# Patient Record
Sex: Female | Born: 1935 | Race: White | Hispanic: No | Smoking: Former smoker
Health system: Southern US, Community
[De-identification: ages and names within clinical notes are randomized; demographics above are authoritative.]

## PROBLEM LIST (undated history)

## (undated) DIAGNOSIS — W19XXXA Unspecified fall, initial encounter: Secondary | ICD-10-CM

## (undated) DIAGNOSIS — R2681 Unsteadiness on feet: Secondary | ICD-10-CM

## (undated) DIAGNOSIS — I4821 Permanent atrial fibrillation: Secondary | ICD-10-CM

## (undated) DIAGNOSIS — I10 Essential (primary) hypertension: Secondary | ICD-10-CM

## (undated) DIAGNOSIS — J45909 Unspecified asthma, uncomplicated: Secondary | ICD-10-CM

## (undated) DIAGNOSIS — I5030 Unspecified diastolic (congestive) heart failure: Secondary | ICD-10-CM

## (undated) DIAGNOSIS — N183 Chronic kidney disease, stage 3 unspecified: Secondary | ICD-10-CM

## (undated) DIAGNOSIS — F0390 Unspecified dementia without behavioral disturbance: Secondary | ICD-10-CM

## (undated) DIAGNOSIS — J449 Chronic obstructive pulmonary disease, unspecified: Secondary | ICD-10-CM

## (undated) HISTORY — PX: BREAST SURGERY: SHX581

## (undated) HISTORY — PX: APPENDECTOMY: SHX54

## (undated) HISTORY — PX: HERNIA REPAIR: SHX51

---

## 2012-01-14 DIAGNOSIS — Z01810 Encounter for preprocedural cardiovascular examination: Secondary | ICD-10-CM

## 2014-09-05 ENCOUNTER — Emergency Department: Payer: Self-pay | Admitting: Emergency Medicine

## 2020-07-05 ENCOUNTER — Other Ambulatory Visit: Payer: Self-pay

## 2020-07-05 ENCOUNTER — Emergency Department (HOSPITAL_BASED_OUTPATIENT_CLINIC_OR_DEPARTMENT_OTHER): Payer: Medicare Other

## 2020-07-05 ENCOUNTER — Inpatient Hospital Stay (HOSPITAL_BASED_OUTPATIENT_CLINIC_OR_DEPARTMENT_OTHER)
Admission: EM | Admit: 2020-07-05 | Discharge: 2020-07-07 | DRG: 177 | Disposition: A | Discharge status: Home Health | Payer: Medicare Other | Attending: Family Medicine | Admitting: Family Medicine

## 2020-07-05 ENCOUNTER — Encounter (HOSPITAL_BASED_OUTPATIENT_CLINIC_OR_DEPARTMENT_OTHER): Payer: Self-pay | Admitting: Emergency Medicine

## 2020-07-05 DIAGNOSIS — I10 Essential (primary) hypertension: Secondary | ICD-10-CM | POA: Diagnosis present

## 2020-07-05 DIAGNOSIS — B961 Klebsiella pneumoniae [K. pneumoniae] as the cause of diseases classified elsewhere: Secondary | ICD-10-CM | POA: Diagnosis not present

## 2020-07-05 DIAGNOSIS — N390 Urinary tract infection, site not specified: Secondary | ICD-10-CM | POA: Diagnosis not present

## 2020-07-05 DIAGNOSIS — J9601 Acute respiratory failure with hypoxia: Secondary | ICD-10-CM | POA: Diagnosis present

## 2020-07-05 DIAGNOSIS — U071 COVID-19: Principal | ICD-10-CM | POA: Diagnosis present

## 2020-07-05 DIAGNOSIS — J449 Chronic obstructive pulmonary disease, unspecified: Secondary | ICD-10-CM | POA: Diagnosis not present

## 2020-07-05 DIAGNOSIS — R0902 Hypoxemia: Secondary | ICD-10-CM | POA: Diagnosis present

## 2020-07-05 DIAGNOSIS — F0390 Unspecified dementia without behavioral disturbance: Secondary | ICD-10-CM | POA: Diagnosis present

## 2020-07-05 DIAGNOSIS — J069 Acute upper respiratory infection, unspecified: Secondary | ICD-10-CM | POA: Diagnosis present

## 2020-07-05 DIAGNOSIS — R531 Weakness: Secondary | ICD-10-CM

## 2020-07-05 DIAGNOSIS — Z87891 Personal history of nicotine dependence: Secondary | ICD-10-CM | POA: Diagnosis not present

## 2020-07-05 DIAGNOSIS — N3000 Acute cystitis without hematuria: Secondary | ICD-10-CM

## 2020-07-05 HISTORY — DX: Unspecified asthma, uncomplicated: J45.909

## 2020-07-05 HISTORY — DX: Essential (primary) hypertension: I10

## 2020-07-05 LAB — BASIC METABOLIC PANEL
Anion gap: 13 (ref 5–15)
BUN: 28 mg/dL — ABNORMAL HIGH (ref 8–23)
CO2: 26 mmol/L (ref 22–32)
Calcium: 8.7 mg/dL — ABNORMAL LOW (ref 8.9–10.3)
Chloride: 95 mmol/L — ABNORMAL LOW (ref 98–111)
Creatinine, Ser: 1.02 mg/dL — ABNORMAL HIGH (ref 0.44–1.00)
GFR, Estimated: 52 mL/min — ABNORMAL LOW (ref >60)
Glucose, Bld: 111 mg/dL — ABNORMAL HIGH (ref 70–99)
Potassium: 4 mmol/L (ref 3.5–5.1)
Sodium: 134 mmol/L — ABNORMAL LOW (ref 135–145)

## 2020-07-05 LAB — COMPREHENSIVE METABOLIC PANEL
ALT: 14 U/L (ref 0–44)
AST: 28 U/L (ref 15–41)
Albumin: 3.8 g/dL (ref 3.5–5.0)
Alkaline Phosphatase: 49 U/L (ref 38–126)
Anion gap: 11 (ref 5–15)
BUN: 26 mg/dL — ABNORMAL HIGH (ref 8–23)
CO2: 26 mmol/L (ref 22–32)
Calcium: 8.3 mg/dL — ABNORMAL LOW (ref 8.9–10.3)
Chloride: 96 mmol/L — ABNORMAL LOW (ref 98–111)
Creatinine, Ser: 0.9 mg/dL (ref 0.44–1.00)
GFR, Estimated: 60 mL/min — ABNORMAL LOW (ref >60)
Glucose, Bld: 100 mg/dL — ABNORMAL HIGH (ref 70–99)
Potassium: 3.5 mmol/L (ref 3.5–5.1)
Sodium: 133 mmol/L — ABNORMAL LOW (ref 135–145)
Total Bilirubin: 0.6 mg/dL (ref 0.3–1.2)
Total Protein: 7.6 g/dL (ref 6.5–8.1)

## 2020-07-05 LAB — APTT: aPTT: 27 seconds (ref 24–36)

## 2020-07-05 LAB — URINALYSIS, MICROSCOPIC (REFLEX)

## 2020-07-05 LAB — CBC WITH DIFFERENTIAL/PLATELET
Abs Immature Granulocytes: 0.05 10*3/uL (ref 0.00–0.07)
Basophils Absolute: 0 10*3/uL (ref 0.0–0.1)
Basophils Relative: 0 %
Eosinophils Absolute: 0 10*3/uL (ref 0.0–0.5)
Eosinophils Relative: 0 %
HCT: 46.8 % — ABNORMAL HIGH (ref 36.0–46.0)
Hemoglobin: 15.7 g/dL — ABNORMAL HIGH (ref 12.0–15.0)
Immature Granulocytes: 1 %
Lymphocytes Relative: 10 %
Lymphs Abs: 1.1 10*3/uL (ref 0.7–4.0)
MCH: 31.3 pg (ref 26.0–34.0)
MCHC: 33.5 g/dL (ref 30.0–36.0)
MCV: 93.4 fL (ref 80.0–100.0)
Monocytes Absolute: 0.8 10*3/uL (ref 0.1–1.0)
Monocytes Relative: 8 %
Neutro Abs: 8.7 10*3/uL — ABNORMAL HIGH (ref 1.7–7.7)
Neutrophils Relative %: 81 %
Platelets: 157 10*3/uL (ref 150–400)
RBC: 5.01 MIL/uL (ref 3.87–5.11)
RDW: 14.6 % (ref 11.5–15.5)
WBC: 10.7 10*3/uL — ABNORMAL HIGH (ref 4.0–10.5)
nRBC: 0 % (ref 0.0–0.2)

## 2020-07-05 LAB — TROPONIN I (HIGH SENSITIVITY): Troponin I (High Sensitivity): 15 ng/L (ref <18)

## 2020-07-05 LAB — URINALYSIS, ROUTINE W REFLEX MICROSCOPIC
Bilirubin Urine: NEGATIVE
Glucose, UA: NEGATIVE mg/dL
Ketones, ur: NEGATIVE mg/dL
Nitrite: NEGATIVE
Protein, ur: >300 mg/dL — AB
Specific Gravity, Urine: 1.03 (ref 1.005–1.030)
pH: 5 (ref 5.0–8.0)

## 2020-07-05 LAB — FIBRINOGEN: Fibrinogen: 552 mg/dL — ABNORMAL HIGH (ref 210–475)

## 2020-07-05 LAB — PROTIME-INR
INR: 1.1 (ref 0.8–1.2)
Prothrombin Time: 14 seconds (ref 11.4–15.2)

## 2020-07-05 LAB — TRIGLYCERIDES: Triglycerides: 59 mg/dL (ref <150)

## 2020-07-05 LAB — FERRITIN: Ferritin: 423 ng/mL — ABNORMAL HIGH (ref 11–307)

## 2020-07-05 LAB — D-DIMER, QUANTITATIVE: D-Dimer, Quant: 1.57 ug/mL-FEU — ABNORMAL HIGH (ref 0.00–0.50)

## 2020-07-05 LAB — PROCALCITONIN: Procalcitonin: <0.1 ng/mL

## 2020-07-05 LAB — C-REACTIVE PROTEIN: CRP: 13.8 mg/dL — ABNORMAL HIGH (ref <1.0)

## 2020-07-05 LAB — LACTIC ACID, PLASMA: Lactic Acid, Venous: 1.9 mmol/L (ref 0.5–1.9)

## 2020-07-05 LAB — SARS CORONAVIRUS 2 BY RT PCR (HOSPITAL ORDER, PERFORMED IN ~~LOC~~ HOSPITAL LAB): SARS Coronavirus 2: POSITIVE — AB

## 2020-07-05 LAB — LACTATE DEHYDROGENASE: LDH: 169 U/L (ref 98–192)

## 2020-07-05 MED ORDER — SODIUM CHLORIDE 0.9 % IV SOLN
100.0000 mg | INTRAVENOUS | Status: AC
  Administered 2020-07-05 (×2): 100 mg via INTRAVENOUS

## 2020-07-05 MED ORDER — SODIUM CHLORIDE 0.9 % IV SOLN
1.0000 g | Freq: Once | INTRAVENOUS | Status: AC
  Administered 2020-07-05: 1 g via INTRAVENOUS
  Filled 2020-07-05: qty 10

## 2020-07-05 MED ORDER — SODIUM CHLORIDE 0.9 % IV SOLN
100.0000 mg | Freq: Every day | INTRAVENOUS | Status: DC
  Administered 2020-07-06 – 2020-07-07 (×2): 100 mg via INTRAVENOUS
  Filled 2020-07-05 (×2): qty 20

## 2020-07-05 MED ORDER — SODIUM CHLORIDE 0.9 % IV BOLUS
500.0000 mL | Freq: Once | INTRAVENOUS | Status: AC
  Administered 2020-07-05: 500 mL via INTRAVENOUS

## 2020-07-05 MED ORDER — DEXAMETHASONE SODIUM PHOSPHATE 10 MG/ML IJ SOLN
6.0000 mg | Freq: Once | INTRAMUSCULAR | Status: AC
  Administered 2020-07-05: 6 mg via INTRAVENOUS
  Filled 2020-07-05: qty 1

## 2020-07-05 NOTE — Progress Notes (Signed)
elink monitoring for sepsis protocol 

## 2020-07-05 NOTE — ED Provider Notes (Addendum)
[facility] EMERGENCY DEPARTMENT Provider Note   CSN: [id number] Arrival date & time: 07/05/20  1148     History Chief Complaint  Patient presents with   Urinary Frequency    Bridget [name] is a 87 y.o. female.  HPI     87yo female with history of COPD, hypertension, mild dementia who resides with her daughter presents with concern for cough, generalized weakness, diarrhea.   3 days of fatigue, generalzied weakness, sleepiness Difficulty waking up, leaving head on table even when bringing food in, sleeping Not eating, was drinking for the past few days but didn't drink today Cough Possible covid exposure No body aches, no sore throat Urinary incontinence Diarrhea, has been incontinent of stool No back pain    Past Medical History:  Diagnosis Date   Asthma    Hypertension     There are no problems to display for this patient.   Past Surgical History:  Procedure Laterality Date   APPENDECTOMY     BREAST SURGERY     HERNIA REPAIR       OB History   No obstetric history on file.     History reviewed. No pertinent family history.  Social History   Tobacco Use   Smoking status: Former Smoker  Substance Use Topics   Alcohol use: Never   Drug use: Never    Home Medications Prior to Admission medications   Not on File    Allergies    Patient has no allergy information on record.  Review of Systems   Review of Systems  Constitutional: Positive for activity change, appetite change and fatigue. Negative for fever.  HENT: Negative for sore throat.   Eyes: Negative for visual disturbance.  Respiratory: Positive for cough. Negative for shortness of breath.   Cardiovascular: Negative for chest pain.  Gastrointestinal: Positive for diarrhea. Negative for abdominal pain, nausea and vomiting.  Genitourinary: Positive for frequency. Negative for difficulty urinating.  Musculoskeletal: Negative for back pain and neck pain.  Skin: Negative for  rash.  Neurological: Negative for syncope and headaches.  Psychiatric/Behavioral: Positive for confusion.    Physical Exam Updated Vital Signs BP (!) 141/94 (BP Location: Right Arm)   Pulse 80   Temp 98.1 F (36.7 C) (Oral)   Resp 20   Ht 5\' 4"  (1.626 m)   Wt 56.7 kg   SpO2 97%   BMI 21.46 kg/m   Physical Exam Vitals and nursing note reviewed.  Constitutional:      General: She is not in acute distress.    Appearance: She is well-developed and well-nourished. She is not diaphoretic.     Comments: Sleepy at times  HENT:     Head: Normocephalic and atraumatic.  Eyes:     Extraocular Movements: EOM normal.     Conjunctiva/sclera: Conjunctivae normal.  Cardiovascular:     Rate and Rhythm: Normal rate and regular rhythm.     Pulses: Intact distal pulses.     Heart sounds: Normal heart sounds. No murmur heard. No friction rub. No gallop.   Pulmonary:     Effort: Pulmonary effort is normal. No respiratory distress.     Breath sounds: Normal breath sounds. No wheezing or rales.  Abdominal:     General: There is no distension.     Palpations: Abdomen is soft.     Tenderness: There is no abdominal tenderness. There is no guarding.  Musculoskeletal:        General: No tenderness or edema.  Cervical back: Normal range of motion.  Skin:    General: Skin is warm and dry.     Findings: No erythema or rash.  Neurological:     Mental Status: She is alert.     Cranial Nerves: No cranial nerve deficit, dysarthria or facial asymmetry.     Sensory: Sensation is intact. No sensory deficit.     Motor: Motor function is intact. No weakness or pronator drift.     Coordination: Coordination is intact. Finger-Nose-Finger Test normal.     Comments: Reports nov 2021     ED Results / Procedures / Treatments   Labs (all labs ordered are listed, but only abnormal results are displayed) Labs Reviewed  SARS CORONAVIRUS 2 BY RT PCR (HOSPITAL ORDER, PERFORMED IN [facility])  - Abnormal; Notable for the following components:      Result Value   SARS Coronavirus 2 POSITIVE (*)    All other components within normal limits  URINALYSIS, ROUTINE W REFLEX MICROSCOPIC - Abnormal; Notable for the following components:   APPearance CLOUDY (*)    Hgb urine dipstick SMALL (*)    Protein, ur >300 (*)    Leukocytes,Ua SMALL (*)    All other components within normal limits  BASIC METABOLIC PANEL - Abnormal; Notable for the following components:   Sodium 134 (*)    Chloride 95 (*)    Glucose, Bld 111 (*)    BUN 28 (*)    Creatinine, Ser 1.02 (*)    Calcium 8.7 (*)    GFR, Estimated 52 (*)    All other components within normal limits  CBC WITH DIFFERENTIAL/PLATELET - Abnormal; Notable for the following components:   WBC 10.7 (*)    Hemoglobin 15.7 (*)    HCT 46.8 (*)    Neutro Abs 8.7 (*)    All other components within normal limits  URINALYSIS, MICROSCOPIC (REFLEX) - Abnormal; Notable for the following components:   Bacteria, UA MANY (*)    All other components within normal limits  CULTURE, BLOOD (ROUTINE X 2)  CULTURE, BLOOD (ROUTINE X 2)  URINE CULTURE  LACTIC ACID, PLASMA  PROTIME-INR  APTT  CBC WITH DIFFERENTIAL/PLATELET  COMPREHENSIVE METABOLIC PANEL  D-DIMER, QUANTITATIVE (NOT AT [facility])  PROCALCITONIN  LACTATE DEHYDROGENASE  FERRITIN  TRIGLYCERIDES  FIBRINOGEN  C-REACTIVE PROTEIN  TROPONIN I (HIGH SENSITIVITY)    EKG EKG Interpretation  Date/Time:  Sunday July 05 2020 13:45:06 EST Ventricular Rate:  92 PR Interval:    QRS Duration: 92 QT Interval:  383 QTC Calculation: 469 R Axis:   -145 Text Interpretation: Artifact, suspect sinus rhythm Right axis deviation Abnormal lateral Q waves Abnormal T, consider ischemia, lateral leads No previous ECGs available Confirmed by Alvira Monday ([id number]) on 07/05/2020 2:13:10 PM   Radiology DG Chest [place] 1 View  Result Date: 07/05/2020 CLINICAL DATA:  87 year old female with possible sepsis.  Fatigue. Decreased appetite. EXAM: PORTABLE CHEST 1 VIEW COMPARISON:  No priors. FINDINGS: No acute consolidative airspace disease. Mild diffuse peribronchial cuffing. No pleural effusions. No evidence of pulmonary edema. No pneumothorax. Mild cardiomegaly. Upper mediastinal contours are within normal limits. Aortic atherosclerosis. Orthopedic fixation hardware in the proximal left humerus incompletely imaged. IMPRESSION: 1. Diffuse peribronchial cuffing which could be seen in the setting of acute or chronic bronchitis. 2. Mild cardiomegaly. 3. Aortic atherosclerosis. Electronically Signed   By: Trudie Reed M.D.   On: 07/05/2020 14:30    Procedures Procedures (including critical care time)  Medications Ordered in  ED Medications  sodium chloride 0.9 % bolus 500 mL (has no administration in time range)  dexamethasone (DECADRON) injection 6 mg (has no administration in time range)  cefTRIAXone (ROCEPHIN) 1 g in sodium chloride 0.9 % 100 mL IVPB (0 g Intravenous Stopped 07/05/20 1550)    ED Course  I have reviewed the triage vital signs and the nursing notes.  Pertinent labs & imaging results that were available during my care of the patient were reviewed by me and considered in my medical decision making (see chart for details).    MDM Rules/Calculators/A&P                          87yo female with history of COPD (not on oxygen, has normal O2 saturation at baseline), hypertension, mild dementia who resides with her daughter presents with concern for cough, generalized weakness, diarrhea. No focal findings on history or exam and have low suspicion for CVA or intracranial abnormality.  Labs show no clinically significant electrolyte abnormalities.  No prior Cr.  UA concerning for UTI.  Given rocephin, blood cx ordered.  Lactate normal, mild leukocytosis without other signs of sepsis.    XR with bronchitis.    COVID testing positive.  She is not vaccinated. Ambulatory saturations down to  70s, continued to be decreased to upper 80s with rest after ambulation.  Ordered COVID labs, decadron, remdesivir.  Will admit for further care.    Coagulation studies ordered to evaluate for signs of sepsis/DIC.    Final Clinical Impression(s) / ED Diagnoses Final diagnoses:  COVID-19  Generalized weakness  Hypoxia  Acute cystitis without hematuria    Rx / DC Orders ED Discharge Orders          Ordered    Ambulatory referral for Covid Treatment        07/05/20 1545             Alvira Monday, MD 07/05/20 1610    Alvira Monday, MD 05/28/21 [phone]

## 2020-07-05 NOTE — ED Notes (Signed)
Got baseline SPO2 and HR, prior to ambulation: O2 96-98% in bed with HR 109. Let pt sit still in bed for a few minutes then got pt up to ambulate with assistance. After 77ft of walking pt SPO2 71% and HR 117. Pt asked to go directly to bed, felt very sob and assisted back to bed. At this time pt placed on 2L, stats were a range of 81-86% with HR 113-117. After watching pt in bed for a few minutes, pt felt less sob.  EDP came into room, to also visualize readings on monitor. EKG done at this time and given to MD.

## 2020-07-05 NOTE — ED Notes (Signed)
In room to check on patient.  IV found dislodged.  Still has a second IV that is C/D/I.  Covered with coban for protection.

## 2020-07-05 NOTE — Progress Notes (Signed)
87 year old female with history of COPD/asthma?, HTN and possible dementia presenting with urinary frequency, cough, generalized weakness and diarrhea for about 3 days.  Unvaccinated against COVID-19.   Vital signs within normal range.  Desaturated to 71% with ambulation but upper 90s at rest on room air.  WBC 10.5 with mild left shift.  Na 134. Cr 1.02 (unknown baseline.  BUN 28.  UA with small LE, > 300 protein and many bacteria.  Cultures obtained.  Troponin normal.  EKG without significant finding.  CXR with peribronchial cuffing suggesting bronchitis.  Started on Decadron and ceftriaxone.  Hospitalist service asked for admission for acute respiratory failure in the setting of COVID-19 infection and possible UTI.  Per EDP, full code.  "Daughter stated that she refused to sign DNR before and wanted everything done"  Accepted patient to [facility].

## 2020-07-05 NOTE — ED Notes (Signed)
Report given to [facility] from [facility].  Leaving with [facility] at this time.

## 2020-07-05 NOTE — ED Triage Notes (Signed)
Pt arrives with daughter, c/o urinary incontinence, decreased appetite,and fatigue. Pt AOx4. Daughter endorses recent covid exposure x 1 week pta, denies any symptoms at this time other than fatigue.

## 2020-07-06 ENCOUNTER — Telehealth: Payer: Self-pay

## 2020-07-06 ENCOUNTER — Encounter (HOSPITAL_COMMUNITY): Payer: Self-pay | Admitting: Student

## 2020-07-06 DIAGNOSIS — I10 Essential (primary) hypertension: Secondary | ICD-10-CM | POA: Diagnosis present

## 2020-07-06 DIAGNOSIS — F0390 Unspecified dementia without behavioral disturbance: Secondary | ICD-10-CM | POA: Diagnosis not present

## 2020-07-06 DIAGNOSIS — J069 Acute upper respiratory infection, unspecified: Secondary | ICD-10-CM

## 2020-07-06 DIAGNOSIS — J9601 Acute respiratory failure with hypoxia: Secondary | ICD-10-CM | POA: Diagnosis not present

## 2020-07-06 DIAGNOSIS — N390 Urinary tract infection, site not specified: Secondary | ICD-10-CM | POA: Diagnosis not present

## 2020-07-06 DIAGNOSIS — U071 COVID-19: Principal | ICD-10-CM

## 2020-07-06 DIAGNOSIS — B961 Klebsiella pneumoniae [K. pneumoniae] as the cause of diseases classified elsewhere: Secondary | ICD-10-CM | POA: Diagnosis not present

## 2020-07-06 DIAGNOSIS — Z87891 Personal history of nicotine dependence: Secondary | ICD-10-CM | POA: Diagnosis not present

## 2020-07-06 DIAGNOSIS — J449 Chronic obstructive pulmonary disease, unspecified: Secondary | ICD-10-CM | POA: Diagnosis not present

## 2020-07-06 DIAGNOSIS — R0902 Hypoxemia: Secondary | ICD-10-CM | POA: Diagnosis present

## 2020-07-06 LAB — CBC WITH DIFFERENTIAL/PLATELET
Abs Immature Granulocytes: 0.01 10*3/uL (ref 0.00–0.07)
Basophils Absolute: 0 10*3/uL (ref 0.0–0.1)
Basophils Relative: 0 %
Eosinophils Absolute: 0 10*3/uL (ref 0.0–0.5)
Eosinophils Relative: 0 %
HCT: 45.2 % (ref 36.0–46.0)
Hemoglobin: 14.7 g/dL (ref 12.0–15.0)
Immature Granulocytes: 0 %
Lymphocytes Relative: 7 %
Lymphs Abs: 0.4 10*3/uL — ABNORMAL LOW (ref 0.7–4.0)
MCH: 31.1 pg (ref 26.0–34.0)
MCHC: 32.5 g/dL (ref 30.0–36.0)
MCV: 95.6 fL (ref 80.0–100.0)
Monocytes Absolute: 0.2 10*3/uL (ref 0.1–1.0)
Monocytes Relative: 4 %
Neutro Abs: 5.1 10*3/uL (ref 1.7–7.7)
Neutrophils Relative %: 89 %
Platelets: 149 10*3/uL — ABNORMAL LOW (ref 150–400)
RBC: 4.73 MIL/uL (ref 3.87–5.11)
RDW: 14.3 % (ref 11.5–15.5)
WBC: 5.8 10*3/uL (ref 4.0–10.5)
nRBC: 0 % (ref 0.0–0.2)

## 2020-07-06 LAB — COMPREHENSIVE METABOLIC PANEL
ALT: 15 U/L (ref 0–44)
AST: 28 U/L (ref 15–41)
Albumin: 3.2 g/dL — ABNORMAL LOW (ref 3.5–5.0)
Alkaline Phosphatase: 45 U/L (ref 38–126)
Anion gap: 11 (ref 5–15)
BUN: 25 mg/dL — ABNORMAL HIGH (ref 8–23)
CO2: 25 mmol/L (ref 22–32)
Calcium: 8.1 mg/dL — ABNORMAL LOW (ref 8.9–10.3)
Chloride: 100 mmol/L (ref 98–111)
Creatinine, Ser: 0.69 mg/dL (ref 0.44–1.00)
GFR, Estimated: >60 mL/min (ref >60)
Glucose, Bld: 111 mg/dL — ABNORMAL HIGH (ref 70–99)
Potassium: 3.7 mmol/L (ref 3.5–5.1)
Sodium: 136 mmol/L (ref 135–145)
Total Bilirubin: 0.6 mg/dL (ref 0.3–1.2)
Total Protein: 6.7 g/dL (ref 6.5–8.1)

## 2020-07-06 LAB — D-DIMER, QUANTITATIVE: D-Dimer, Quant: 1.26 ug/mL-FEU — ABNORMAL HIGH (ref 0.00–0.50)

## 2020-07-06 LAB — C-REACTIVE PROTEIN: CRP: 12.1 mg/dL — ABNORMAL HIGH (ref <1.0)

## 2020-07-06 MED ORDER — ONDANSETRON HCL 4 MG PO TABS: 4.0000 mg | ORAL_TABLET | Freq: Four times a day (QID) | ORAL | Status: DC | PRN

## 2020-07-06 MED ORDER — DEXAMETHASONE 4 MG PO TABS
6.0000 mg | ORAL_TABLET | ORAL | Status: DC
  Administered 2020-07-06 – 2020-07-07 (×2): 6 mg via ORAL
  Filled 2020-07-06 (×2): qty 2

## 2020-07-06 MED ORDER — LISINOPRIL-HYDROCHLOROTHIAZIDE 20-12.5 MG PO TABS: 1.0000 | ORAL_TABLET | Freq: Every day | ORAL | Status: DC

## 2020-07-06 MED ORDER — ENOXAPARIN SODIUM 40 MG/0.4ML ~~LOC~~ SOLN
40.0000 mg | SUBCUTANEOUS | Status: DC
  Administered 2020-07-06 – 2020-07-07 (×2): 40 mg via SUBCUTANEOUS
  Filled 2020-07-06 (×2): qty 0.4

## 2020-07-06 MED ORDER — SODIUM CHLORIDE 0.9 % IV SOLN
1.0000 g | Freq: Every day | INTRAVENOUS | Status: DC
  Administered 2020-07-06: 1 g via INTRAVENOUS
  Filled 2020-07-06: qty 10

## 2020-07-06 MED ORDER — HYDROCERIN EX CREA
TOPICAL_CREAM | Freq: Two times a day (BID) | CUTANEOUS | Status: DC
  Filled 2020-07-06: qty 113

## 2020-07-06 MED ORDER — LISINOPRIL 20 MG PO TABS
20.0000 mg | ORAL_TABLET | Freq: Every day | ORAL | Status: DC
  Administered 2020-07-06 – 2020-07-07 (×2): 20 mg via ORAL
  Filled 2020-07-06 (×2): qty 1

## 2020-07-06 MED ORDER — HYDROCHLOROTHIAZIDE 12.5 MG PO CAPS
12.5000 mg | ORAL_CAPSULE | Freq: Every day | ORAL | Status: DC
Start: 2020-07-06 — End: 2020-07-07
  Administered 2020-07-06 – 2020-07-07 (×2): 12.5 mg via ORAL
  Filled 2020-07-06 (×2): qty 1

## 2020-07-06 MED ORDER — ALBUTEROL SULFATE HFA 108 (90 BASE) MCG/ACT IN AERS
1.0000 | INHALATION_SPRAY | RESPIRATORY_TRACT | Status: DC | PRN
  Administered 2020-07-07: 2 via RESPIRATORY_TRACT
  Filled 2020-07-06: qty 6.7

## 2020-07-06 MED ORDER — ONDANSETRON HCL 4 MG/2ML IJ SOLN: 4.0000 mg | Freq: Four times a day (QID) | INTRAMUSCULAR | Status: DC | PRN

## 2020-07-06 MED ORDER — ACETAMINOPHEN 325 MG PO TABS: 650.0000 mg | ORAL_TABLET | Freq: Four times a day (QID) | ORAL | Status: DC | PRN

## 2020-07-06 NOTE — Progress Notes (Signed)
PROGRESS NOTE    Bridget [name]  XIP:[id number] DOB: 10-08-1925 DOA: 07/05/2020 PCP: Glori Bickers, MD   Brief Narrative: Bridget [name] is a 87 y.o. female with a history of COPD/Asthma, hypertension, dementia. Patient presented secondary to generalized weakness and found to have COVID-19 infection.   Assessment & Plan:   Principal Problem:   Acute respiratory disease due to COVID-19 virus Active Problems:   HTN (hypertension)   Acute respiratory disease secondary COVID-19 infection No overt pneumonia. Patient started on Remdesivir and steroids for hypoxia.  -Wean to room air -Keep O2 >90%  COVID-19 infection No overt pneumonia but does have hypoxia.  UTI Gram negative rods on urine culture -Ceftriaxone  Primary hypertension -Continue lisinopril-hydrochlorothiazide    DVT prophylaxis: Lovenox Code Status:   Code Status: Full Code Family Communication: Daughter on telephone Disposition Plan: Discharge likely in 1-2 days pending wean to room air if able, PT/OT recommendations   Consultants:   None  Procedures:   None  Antimicrobials:  Ceftriaxone    Subjective: No issues. Eager to go home.  Objective: Vitals:   07/06/20 0035 07/06/20 0434 07/06/20 0948 07/06/20 1337  BP: (!) 173/103 140/82 (!) 157/74 (!) 141/83  Pulse: 61 73 89 78  Resp: 18 (!) 21 18 16   Temp: 97.9 F (36.6 C) 98.4 F (36.9 C) (!) 97.5 F (36.4 C) 98.1 F (36.7 C)  TempSrc:    Oral  SpO2: 100% 100% 100% 100%  Weight:      Height:        Intake/Output Summary (Last 24 hours) at 07/06/2020 1554 Last data filed at 07/06/2020 1500 Gross per 24 hour  Intake 1160 ml  Output -  Net 1160 ml   Filed Weights   07/05/20 1210  Weight: 56.7 kg    Examination:  General exam: Appears calm and comfortable Respiratory system: Clear to auscultation. Respiratory effort normal. Cardiovascular system: S1 & S2 heard, RRR. No murmurs, rubs, gallops or clicks. Gastrointestinal  system: Abdomen is nondistended, soft and nontender. No organomegaly or masses felt. Normal bowel sounds heard. Central nervous system: Alert and oriented to person and place. No focal neurological deficits. Musculoskeletal: No edema. No calf tenderness Skin: No cyanosis. No rashes Psychiatry: Judgement and insight appear normal. Mood & affect appropriate.     Data Reviewed: I have personally reviewed following labs and imaging studies  CBC Lab Results  Component Value Date   WBC 5.8 07/06/2020   RBC 4.73 07/06/2020   HGB 14.7 07/06/2020   HCT 45.2 07/06/2020   MCV 95.6 07/06/2020   MCH 31.1 07/06/2020   PLT 149 (L) 07/06/2020   MCHC 32.5 07/06/2020   RDW 14.3 07/06/2020   LYMPHSABS 0.4 (L) 07/06/2020   MONOABS 0.2 07/06/2020   EOSABS 0.0 07/06/2020   BASOSABS 0.0 07/06/2020     Last metabolic panel Lab Results  Component Value Date   NA 136 07/06/2020   K 3.7 07/06/2020   CL 100 07/06/2020   CO2 25 07/06/2020   BUN 25 (H) 07/06/2020   CREATININE 0.69 07/06/2020   GLUCOSE 111 (H) 07/06/2020   GFRNONAA >60 07/06/2020   CALCIUM 8.1 (L) 07/06/2020   PROT 6.7 07/06/2020   ALBUMIN 3.2 (L) 07/06/2020   BILITOT 0.6 07/06/2020   ALKPHOS 45 07/06/2020   AST 28 07/06/2020   ALT 15 07/06/2020   ANIONGAP 11 07/06/2020    CBG (last 3)  No results for input(s): GLUCAP in the last 72 hours.   GFR: Estimated  Creatinine Clearance: 38.7 mL/min (by C-G formula based on SCr of 0.69 mg/dL).  Coagulation Profile: Recent Labs  Lab 07/05/20 1442  INR 1.1    Recent Results (from the past 240 hour(s))  Urine culture     Status: Abnormal (Preliminary result)   Collection Time: 07/05/20 12:32 PM   Specimen: In/Out Cath Urine  Result Value Ref Range Status   Specimen Description   Final    IN/OUT CATH URINE Performed at [facility], [address] [place], [place] [address]    Special Requests   Final    NONE Performed at [facility], [address] [place], [place] [address]    Culture >=100,000 COLONIES/mL GRAM NEGATIVE RODS (A)  Final   Report Status PENDING  Incomplete  Blood Culture (routine x 2)     Status: None (Preliminary result)   Collection Time: 07/05/20  2:32 PM   Specimen: BLOOD  Result Value Ref Range Status   Specimen Description   Final    BLOOD LEFT ANTECUBITAL Performed at [facility], [address], [place], [place] [address]    Special Requests   Final    BOTTLES DRAWN AEROBIC AND ANAEROBIC Blood Culture adequate volume Performed at [facility], [address] [place], [place] [address]    Culture PENDING  Incomplete   Report Status PENDING  Incomplete  SARS Coronavirus 2 by RT PCR (hospital order, performed in [facility] hospital lab) Nasopharyngeal Nasopharyngeal Swab     Status: Abnormal   Collection Time: 07/05/20  2:32 PM   Specimen: Nasopharyngeal Swab  Result Value Ref Range Status   SARS Coronavirus 2 POSITIVE (A) NEGATIVE Final    Comment: RESULT CALLED TO, READ BACK BY AND VERIFIED WITH: Heywood Bene, RN AT 1526 ON [id number] BY BOWLBY, J (NOTE) SARS-CoV-2 target nucleic acids are DETECTED  SARS-CoV-2 RNA is generally detectable in upper respiratory specimens  during the acute phase of infection.  Positive results are indicative  of the presence of the identified virus, but do not rule out bacterial infection or co-infection with other pathogens not detected by the test.  Clinical correlation with patient history and  other diagnostic information is necessary to determine patient infection status.  The expected result is negative.  Fact Sheet for Patients:   BoilerBrush.com.cy   Fact Sheet for Healthcare Providers:   https://pope.com/    This test is not yet approved or cleared by the [place] FDA and  has been authorized for detection and/or diagnosis of SARS-CoV-2 by FDA under an Emergency Use Authorization (EUA).   This EUA will remain in effect (mean ing this test can be used) for the duration of  the COVID-19 declaration under Section 564(b)(1) of the Act, 21 U.S.C. section 360-bbb-3(b)(1), unless the authorization is terminated or revoked sooner.  Performed at [facility], [address] [place], [place] [address]         Radiology Studies: DG Chest [place] 1 View  Result Date: 07/05/2020 CLINICAL DATA:  87 year old female with possible sepsis. Fatigue. Decreased appetite. EXAM: PORTABLE CHEST 1 VIEW COMPARISON:  No priors. FINDINGS: No acute consolidative airspace disease. Mild diffuse peribronchial cuffing. No pleural effusions. No evidence of pulmonary edema. No pneumothorax. Mild cardiomegaly. Upper mediastinal contours are within normal limits. Aortic atherosclerosis. Orthopedic fixation hardware in the proximal left humerus incompletely imaged. IMPRESSION: 1. Diffuse peribronchial cuffing which could be seen in the setting of acute  or chronic bronchitis. 2. Mild cardiomegaly. 3. Aortic atherosclerosis. Electronically Signed   By: Trudie Reed M.D.   On: 07/05/2020 14:30        Scheduled Meds: . dexamethasone  6 mg Oral Q24H  . enoxaparin (LOVENOX) injection  40 mg Subcutaneous Q24H  . hydrocerin   Topical BID  . lisinopril  20 mg Oral Daily   And  . hydrochlorothiazide  12.5 mg Oral Daily   Continuous Infusions: . remdesivir 100 mg in NS 100 mL 100 mg (07/06/20 1039)     LOS: 0 days     Jacquelin Hawking, MD [facility] 07/06/2020, 3:54 PM  If 7PM-7AM, please contact night-coverage [email or web address]

## 2020-07-06 NOTE — Telephone Encounter (Signed)
Reviewing pt. Chart for possible MAB. Pt. Currently hospitalized.

## 2020-07-06 NOTE — Plan of Care (Signed)
  Problem: Education: Goal: Knowledge of risk factors and measures for prevention of condition will improve Outcome: Progressing   Problem: Coping: Goal: Psychosocial and spiritual needs will be supported Outcome: Progressing   Problem: Respiratory: Goal: Will maintain a patent airway Outcome: Progressing Goal: Complications related to the disease process, condition or treatment will be avoided or minimized Outcome: Progressing   Problem: Education: Goal: Knowledge of General Education information will improve Description: Including pain rating scale, medication(s)/side effects and non-pharmacologic comfort measures Outcome: Progressing   Problem: Health Behavior/Discharge Planning: Goal: Ability to manage health-related needs will improve Outcome: Progressing   Problem: Clinical Measurements: Goal: Ability to maintain clinical measurements within normal limits will improve Outcome: Progressing Goal: Will remain free from infection Outcome: Progressing Goal: Respiratory complications will improve Outcome: Progressing Goal: Cardiovascular complication will be avoided Outcome: Progressing   Problem: Activity: Goal: Risk for activity intolerance will decrease Outcome: Progressing   Problem: Nutrition: Goal: Adequate nutrition will be maintained Outcome: Progressing   Problem: Coping: Goal: Level of anxiety will decrease Outcome: Progressing   Problem: Elimination: Goal: Will not experience complications related to bowel motility Outcome: Progressing Goal: Will not experience complications related to urinary retention Outcome: Progressing   Problem: Pain Managment: Goal: General experience of comfort will improve Outcome: Progressing   Problem: Safety: Goal: Ability to remain free from injury will improve Outcome: Progressing

## 2020-07-06 NOTE — H&P (Addendum)
History and Physical    Bridget [name] ERD:[id number] DOB: 20-Mar-1926 DOA: 07/05/2020  PCP: Glori Bickers, MD  Patient coming from: Home  I have personally briefly reviewed patient's old medical records in [facility] Link  Chief Complaint: generalized weakness  HPI: Bridget [name] is a 87 y.o. female with medical history significant of COPD/asthma, HTN, mild dementia.  Pt resides at home with daughter.  Pt presents to ED with c/o 3 day h/o cough, generalized weakness, fatigue.  Symptoms onset a few days ago, persistent, worsening.  Associated poor PO intake, diarrhea.  Symptoms constant, now severe.  No known fevers, chills, body aches, sore throat.  Possible COVID exposure x1 week ago.   ED Course: COVID+  Desats to 71% with ambulation but upper 90s at rest on RA.  Started on remdesivir and steroids.  Review of Systems: As per HPI, otherwise all review of systems negative.  Past Medical History:  Diagnosis Date  . Asthma   . Hypertension     Past Surgical History:  Procedure Laterality Date  . APPENDECTOMY    . BREAST SURGERY    . HERNIA REPAIR       reports that she has quit smoking. She has never used smokeless tobacco. She reports that she does not drink alcohol and does not use drugs.  No Known Allergies  Family History  Problem Relation Age of Onset  . Immunocompromised Neg Hx     Prior to Admission medications   Medication Sig Start Date End Date Taking? Authorizing Provider  lisinopril-hydrochlorothiazide (ZESTORETIC) 20-12.5 MG tablet Take 1 tablet by mouth daily. 06/15/20  Yes [provider]  ipratropium-albuterol (DUONEB) 0.5-2.5 (3) MG/3ML SOLN Take 3 mLs by nebulization 4 (four) times daily. 02/24/20   [provider]    Physical Exam: Vitals:   07/05/20 1600 07/05/20 1900 07/05/20 2130 07/06/20 0035  BP: (!) 174/93 (!) 154/103 110/71 (!) 173/103  Pulse: (!) 55 (!) 108 89 61  Resp: (!) 21 (!) 22 17 18   Temp:  98.3 F  (36.8 C) 98.4 F (36.9 C) 97.9 F (36.6 C)  TempSrc:  Oral Oral   SpO2: 93% 99% 99% 100%  Weight:      Height:        Constitutional: NAD, calm, comfortable Eyes: PERRL, lids and conjunctivae normal ENMT: Mucous membranes are moist. Posterior pharynx clear of any exudate or lesions.Normal dentition.  Neck: normal, supple, no masses, no thyromegaly Respiratory: clear to auscultation bilaterally, no wheezing, no crackles. Normal respiratory effort. No accessory muscle use.  Cardiovascular: Regular rate and rhythm, no murmurs / rubs / gallops. No extremity edema. 2+ pedal pulses. No carotid bruits.  Abdomen: no tenderness, no masses palpated. No hepatosplenomegaly. Bowel sounds positive.  Musculoskeletal: no clubbing / cyanosis. No joint deformity upper and lower extremities. Good ROM, no contractures. Normal muscle tone.  Skin: no rashes, lesions, ulcers. No induration Neurologic: CN 2-12 grossly intact. Sensation intact, DTR normal. Strength 5/5 in all 4.  Psychiatric: Normal judgment and insight. Alert and oriented x 3. Normal mood.    Labs on Admission: I have personally reviewed following labs and imaging studies  CBC: Recent Labs  Lab 07/05/20 1232  WBC 10.7*  NEUTROABS 8.7*  HGB 15.7*  HCT 46.8*  MCV 93.4  PLT 157   Basic Metabolic Panel: Recent Labs  Lab 07/05/20 1232 07/05/20 1620  NA 134* 133*  K 4.0 3.5  CL 95* 96*  CO2 26 26  GLUCOSE 111* 100*  BUN  28* 26*  CREATININE 1.02* 0.90  CALCIUM 8.7* 8.3*   GFR: Estimated Creatinine Clearance: 34.4 mL/min (by C-G formula based on SCr of 0.9 mg/dL). Liver Function Tests: Recent Labs  Lab 07/05/20 1620  AST 28  ALT 14  ALKPHOS 49  BILITOT 0.6  PROT 7.6  ALBUMIN 3.8   No results for input(s): LIPASE, AMYLASE in the last 168 hours. No results for input(s): AMMONIA in the last 168 hours. Coagulation Profile: Recent Labs  Lab 07/05/20 1442  INR 1.1   Cardiac Enzymes: No results for input(s): CKTOTAL,  CKMB, CKMBINDEX, TROPONINI in the last 168 hours. BNP (last 3 results) No results for input(s): PROBNP in the last 8760 hours. HbA1C: No results for input(s): HGBA1C in the last 72 hours. CBG: No results for input(s): GLUCAP in the last 168 hours. Lipid Profile: Recent Labs    07/05/20 1620  TRIG 59   Thyroid Function Tests: No results for input(s): TSH, T4TOTAL, FREET4, T3FREE, THYROIDAB in the last 72 hours. Anemia Panel: Recent Labs    07/05/20 1620  FERRITIN 423*   Urine analysis:    Component Value Date/Time   COLORURINE YELLOW 07/05/2020 1232   APPEARANCEUR CLOUDY (A) 07/05/2020 1232   LABSPEC 1.030 07/05/2020 1232   PHURINE 5.0 07/05/2020 1232   GLUCOSEU NEGATIVE 07/05/2020 1232   HGBUR SMALL (A) 07/05/2020 1232   BILIRUBINUR NEGATIVE 07/05/2020 1232   KETONESUR NEGATIVE 07/05/2020 1232   PROTEINUR >300 (A) 07/05/2020 1232   NITRITE NEGATIVE 07/05/2020 1232   LEUKOCYTESUR SMALL (A) 07/05/2020 1232    Radiological Exams on Admission: DG Chest Port 1 View  Result Date: 07/05/2020 CLINICAL DATA:  87 year old female with possible sepsis. Fatigue. Decreased appetite. EXAM: PORTABLE CHEST 1 VIEW COMPARISON:  No priors. FINDINGS: No acute consolidative airspace disease. Mild diffuse peribronchial cuffing. No pleural effusions. No evidence of pulmonary edema. No pneumothorax. Mild cardiomegaly. Upper mediastinal contours are within normal limits. Aortic atherosclerosis. Orthopedic fixation hardware in the proximal left humerus incompletely imaged. IMPRESSION: 1. Diffuse peribronchial cuffing which could be seen in the setting of acute or chronic bronchitis. 2. Mild cardiomegaly. 3. Aortic atherosclerosis. Electronically Signed   By: Trudie Reed M.D.   On: 07/05/2020 14:30    EKG: Independently reviewed.  Assessment/Plan Principal Problem:   Acute respiratory disease due to COVID-19 virus Active Problems:   HTN (hypertension)    1. Acute resp disease due to  COVID-19 1. COVID pathway 2. remdesivir 3. Decadron 4. Currently satting 98% on just 2L -> no baricitinib at this time 5. Daily labs 6. Cont pulse ox 2. Possible UTI - 1. Got single dose of rocephin in ED 2. UCx pending, procalcitonin negative 3. HTN - 1. Cont lisinopril-HCTZ  DVT prophylaxis: Lovenox Code Status: Full code for now Family Communication: No family in room Disposition Plan: Home after O2 requirement improved Consults called: None Admission status: Admit to inpatient  Severity of Illness: The appropriate patient status for this patient is INPATIENT. Inpatient status is judged to be reasonable and necessary in order to provide the required intensity of service to ensure the patient's safety. The patient's presenting symptoms, physical exam findings, and initial radiographic and laboratory data in the context of their chronic comorbidities is felt to place them at high risk for further clinical deterioration. Furthermore, it is not anticipated that the patient will be medically stable for discharge from the hospital within 2 midnights of admission. The following factors support the patient status of inpatient.   IP status due  to new O2 requirement associated with COVID-19.  * I certify that at the point of admission it is my clinical judgment that the patient will require inpatient hospital care spanning beyond 2 midnights from the point of admission due to high intensity of service, high risk for further deterioration and high frequency of surveillance required.*    Nethra Mehlberg M. DO [facility]  How to contact the [facility] Attending or Consulting provider 7A - 7P or covering provider during after hours 7P -7A, for this patient?  1. Check the care team in [facility] and look for a) attending/consulting TRH provider listed and b) the [facility] team listed 2. Log into [email or web address]  Amion Physician Scheduling and messaging for groups and whole hospitals  On call and physician  scheduling software for group practices, residents, hospitalists and other medical providers for call, clinic, rotation and shift schedules. OnCall [employer] is a hospital-wide system for scheduling doctors and paging doctors on call. EasyPlot is for scientific plotting and data analysis.  [email or web address]  and use [place]'s universal password to access. If you do not have the password, please contact the hospital operator.  3. Locate the [facility] provider you are looking for under [facility] and page to a number that you can be directly reached. 4. If you still have difficulty reaching the provider, please page the [facility] (Director on Call) for the Hospitalists listed on amion for assistance.  07/06/2020, 1:30 AM

## 2020-07-07 DIAGNOSIS — U071 COVID-19: Secondary | ICD-10-CM | POA: Diagnosis not present

## 2020-07-07 DIAGNOSIS — R0902 Hypoxemia: Secondary | ICD-10-CM | POA: Diagnosis not present

## 2020-07-07 DIAGNOSIS — J069 Acute upper respiratory infection, unspecified: Secondary | ICD-10-CM | POA: Diagnosis not present

## 2020-07-07 LAB — URINE CULTURE: Culture: >=100000 — AB

## 2020-07-07 LAB — COMPREHENSIVE METABOLIC PANEL
ALT: 19 U/L (ref 0–44)
AST: 28 U/L (ref 15–41)
Albumin: 3.6 g/dL (ref 3.5–5.0)
Alkaline Phosphatase: 48 U/L (ref 38–126)
Anion gap: 10 (ref 5–15)
BUN: 29 mg/dL — ABNORMAL HIGH (ref 8–23)
CO2: 29 mmol/L (ref 22–32)
Calcium: 8.7 mg/dL — ABNORMAL LOW (ref 8.9–10.3)
Chloride: 101 mmol/L (ref 98–111)
Creatinine, Ser: 0.74 mg/dL (ref 0.44–1.00)
GFR, Estimated: >60 mL/min (ref >60)
Glucose, Bld: 133 mg/dL — ABNORMAL HIGH (ref 70–99)
Potassium: 4.3 mmol/L (ref 3.5–5.1)
Sodium: 140 mmol/L (ref 135–145)
Total Bilirubin: 0.4 mg/dL (ref 0.3–1.2)
Total Protein: 7.4 g/dL (ref 6.5–8.1)

## 2020-07-07 LAB — D-DIMER, QUANTITATIVE: D-Dimer, Quant: 0.92 ug/mL-FEU — ABNORMAL HIGH (ref 0.00–0.50)

## 2020-07-07 LAB — CBC WITH DIFFERENTIAL/PLATELET
Abs Immature Granulocytes: 0.03 10*3/uL (ref 0.00–0.07)
Basophils Absolute: 0 10*3/uL (ref 0.0–0.1)
Basophils Relative: 0 %
Eosinophils Absolute: 0 10*3/uL (ref 0.0–0.5)
Eosinophils Relative: 0 %
HCT: 47.7 % — ABNORMAL HIGH (ref 36.0–46.0)
Hemoglobin: 15.2 g/dL — ABNORMAL HIGH (ref 12.0–15.0)
Immature Granulocytes: 1 %
Lymphocytes Relative: 8 %
Lymphs Abs: 0.5 10*3/uL — ABNORMAL LOW (ref 0.7–4.0)
MCH: 30.5 pg (ref 26.0–34.0)
MCHC: 31.9 g/dL (ref 30.0–36.0)
MCV: 95.6 fL (ref 80.0–100.0)
Monocytes Absolute: 0.2 10*3/uL (ref 0.1–1.0)
Monocytes Relative: 3 %
Neutro Abs: 5.9 10*3/uL (ref 1.7–7.7)
Neutrophils Relative %: 88 %
Platelets: 199 10*3/uL (ref 150–400)
RBC: 4.99 MIL/uL (ref 3.87–5.11)
RDW: 14.3 % (ref 11.5–15.5)
WBC: 6.6 10*3/uL (ref 4.0–10.5)
nRBC: 0 % (ref 0.0–0.2)

## 2020-07-07 LAB — C-REACTIVE PROTEIN: CRP: 7.9 mg/dL — ABNORMAL HIGH (ref <1.0)

## 2020-07-07 MED ORDER — DEXAMETHASONE 6 MG PO TABS: 6.0000 mg | ORAL_TABLET | Freq: Every day | ORAL | 0 refills | Status: AC

## 2020-07-07 MED ORDER — CEPHALEXIN 500 MG PO CAPS: 500.0000 mg | ORAL_CAPSULE | Freq: Two times a day (BID) | ORAL | 0 refills | Status: AC

## 2020-07-07 MED ORDER — SODIUM CHLORIDE 0.9 % IV SOLN
1.0000 g | Freq: Once | INTRAVENOUS | Status: AC
  Administered 2020-07-07: 1 g via INTRAVENOUS
  Filled 2020-07-07: qty 1

## 2020-07-07 NOTE — Progress Notes (Signed)
LMOVM for pt daughter to return call in regards to discharge.

## 2020-07-07 NOTE — Progress Notes (Signed)
Occupational Therapy Evaluation  Patient is very pleasant and cooperative with history of cognitive deficits. States she stayed with Bridget [name] and Bridget [name] for a while but states she is now home alone at times "they live 10 mins away." However, per chart patient still living with Bridget [name]. Patient supervision to min G for UB ADL, min A for LB ADL and functional transfers due to x1 loss of balance backing up to toilet with rolling walker. Also requires cues for sequencing ADL tasks. Patient VSS on room air throughout session. Recommend continued acute OT services to maximize patient safety and activity tolerance in order to facilitate D/C to venue listed below.    07/07/20 1300  OT Visit Information  Last OT Received On 07/07/20  Assistance Needed +1  History of Present Illness Bridget [name] is a 87 y.o. female with a history of COPD/Asthma, hypertension, dementia. Patient presented secondary to generalized weakness and found to have COVID-19 infection  Precautions  Precautions Fall  Restrictions  Weight Bearing Restrictions No  Home Living  Family/patient expects to be discharged to: Private residence  Living Arrangements Children  Available Help at Discharge Family  Type of Home House  Additional Comments patient unable to provide home specifics.  Prior Function  Level of Independence Needs assistance  Gait / Transfers Assistance Needed indicates she ambulats at baseline, ? with RW, patient unable to provide reliable history  Communication  Communication No difficulties  Pain Assessment  Pain Assessment Faces  Faces Pain Scale 0  Cognition  Arousal/Alertness Awake/alert  Behavior During Therapy WFL for tasks assessed/performed  Overall Cognitive Status History of cognitive impairments - at baseline  General Comments very sweet, oriented to self, follows directions with increased time and redirection  Upper Extremity Assessment  Upper Extremity Assessment Overall WFL for tasks assessed   Lower Extremity Assessment  Lower Extremity Assessment Defer to PT evaluation  ADL  Overall ADL's  Needs assistance/impaired  Grooming Brushing hair;Minimal assistance;Standing  Grooming Details (indicate cue type and reason) min G for safety  Upper Body Bathing Supervision/ safety;Sitting  Lower Body Bathing Min guard;Sitting/lateral leans;Sit to/from stand  Upper Body Dressing  Supervision/safety;Sitting  Lower Body Dressing Min guard;Sit to/from stand;Sitting/lateral leans  Toilet Transfer Minimal assistance;Ambulation;RW;Grab Forensic psychologist Details (indicate cue type and reason) x1 minor loss of balance posteriorly when backing up to toilet, min A for safety  Toileting- Architect and Hygiene Min guard;Sit to/from stand;Sitting/lateral lean  Functional mobility during ADLs Min guard;Minimal assistance;Rolling walker;Cueing for safety;Cueing for sequencing  General ADL Comments patient appears close to her baseline, would recommend 24/7 Supervision due to cognitive deficits and mild loss of balance during functional transfer in order to minimize risk of falls  Bed Mobility  General bed mobility comments sitting on bed edge  Transfers  Overall transfer level Needs assistance  Equipment used Rolling walker (2 wheeled)  Transfers Sit to/from Stand  Sit to Stand Min assist  Balance  Overall balance assessment Mild deficits observed, not formally tested  General Comments  General comments (skin integrity, edema, etc.) VSS on room air  OT - End of Session  Equipment Utilized During Treatment Rolling walker  Activity Tolerance Patient tolerated treatment well  Patient left in chair;with call bell/phone within reach;with chair alarm set  Nurse Communication Mobility status  OT Assessment  OT Recommendation/Assessment Patient needs continued OT Services  OT Visit Diagnosis Unsteadiness on feet (R26.81)  OT Problem List Impaired balance (sitting and/or  standing);Decreased activity tolerance;Decreased safety  awareness  OT Plan  OT Frequency (ACUTE ONLY) Min 2X/week  OT Treatment/Interventions (ACUTE ONLY) Self-care/ADL training;Therapeutic activities;Patient/family education;Balance training;Cognitive remediation/compensation  AM-PAC OT "6 Clicks" Daily Activity Outcome Measure (Version 2)  Help from another person eating meals? 4  Help from another person taking care of personal grooming? 3  Help from another person toileting, which includes using toliet, bedpan, or urinal? 3  Help from another person bathing (including washing, rinsing, drying)? 3  Help from another person to put on and taking off regular upper body clothing? 3  Help from another person to put on and taking off regular lower body clothing? 3  6 Click Score 19  OT Recommendation  Follow Up Recommendations Supervision/Assistance - 24 hour  OT Equipment None recommended by OT  Individuals Consulted  Consulted and Agree with Results and Recommendations Patient  Acute Rehab OT Goals  Patient Stated Goal agreed to walk in room  OT Goal Formulation With patient  Time For Goal Achievement 07/21/20  Potential to Achieve Goals Good  OT Time Calculation  OT Start Time (ACUTE ONLY) 0857  OT Stop Time (ACUTE ONLY) 4098  OT Time Calculation (min) 25 min  OT General Charges  $OT Visit 1 Visit  OT Evaluation  $OT Eval Low Complexity 1 Low  Written Expression  Dominant Hand  (did not specify)   Bridget [name] OT OT pager: [phone]

## 2020-07-07 NOTE — Progress Notes (Signed)
SATURATION QUALIFICATIONS: (This note is used to comply with regulatory documentation for home oxygen)  Patient Saturations on Room Air at Rest = 95%  Patient Saturations on Room Air while Ambulating = 92%  Patient Saturations on  Liters of oxygen while Ambulating = %NT  Please briefly explain why patient needs home oxygen:Does not require supplemental oxygen. Blanchard Kelch PT Acute Rehabilitation Services Pager [phone] Office [phone]

## 2020-07-07 NOTE — Progress Notes (Signed)
Went over discharge information w/ pt daughter.

## 2020-07-07 NOTE — Evaluation (Signed)
Physical Therapy Evaluation Patient Details Name: Bridget [name] MRN: [id number] DOB: [birth year] Today's Date: 07/07/2020   History of Present Illness  Bridget [name] is a 87 y.o. female with a history of COPD/Asthma, hypertension, dementia. Patient presented secondary to generalized weakness and found to have COVID-19 infection  Clinical Impression  The patient is very pleasantly confused. Ambulated using RW x 50' with min.minguard assistance. Patient  Resides with daughter. Patient should progress to return home with 24/7 supervision. Pt admitted with above diagnosis.   Pt currently with functional limitations due to the deficits listed below (see PT Problem List). Pt will benefit from skilled PT to increase their independence and safety with mobility to allow discharge to the venue listed below.       Follow Up Recommendations Home health PT;Supervision/Assistance - 24 hour    Equipment Recommendations  Rolling walker with 5" wheels/ Unsure if she has one   Recommendations for Other Services       Precautions / Restrictions Precautions Precautions: Fall      Mobility  Bed Mobility               General bed mobility comments: sitting on bed edge    Transfers Overall transfer level: Needs assistance Equipment used: Rolling walker (2 wheeled) Transfers: Sit to/from Stand Sit to Stand: Min assist         General transfer comment: steady assist  to rise, provided RW to steady  Ambulation/Gait Ambulation/Gait assistance: Min assist;Min guard Gait Distance (Feet): 50 Feet Assistive device: Rolling walker (2 wheeled) Gait Pattern/deviations: Step-through pattern;Trunk flexed;Decreased stride length Gait velocity: decr   General Gait Details: patient ambulated in room using RW.  Stairs            Wheelchair Mobility    Modified Rankin (Stroke Patients Only)       Balance Overall balance assessment: Mild deficits observed, not formally tested                                            Pertinent Vitals/Pain      Home Living Family/patient expects to be discharged to:: Private residence Living Arrangements: Children Available Help at Discharge: Family Type of Home: House           Additional Comments: patient unable to provide home specifics.    Prior Function Level of Independence: Needs assistance   Gait / Transfers Assistance Needed: indicates she ambulats at baseline, ? with RW, patient uinable           Hand Dominance        Extremity/Trunk Assessment   Upper Extremity Assessment Upper Extremity Assessment: Generalized weakness    Lower Extremity Assessment Lower Extremity Assessment: Generalized weakness    Cervical / Trunk Assessment Cervical / Trunk Assessment: Kyphotic  Communication   Communication: No difficulties  Cognition     Overall Cognitive Status: History of cognitive impairments - at baseline                                 General Comments: very sweet, oriented to self, follows directions with increased time and redirection      General Comments      Exercises     Assessment/Plan    PT Assessment Patient needs continued PT services  PT Problem List  Decreased strength;Decreased cognition;Decreased mobility;Decreased activity tolerance;Decreased knowledge of use of DME;Decreased safety awareness       PT Treatment Interventions DME instruction;Therapeutic activities;Gait training;Therapeutic exercise;Patient/family education;Functional mobility training    PT Goals (Current goals can be found in the Care Plan section)  Acute Rehab PT Goals Patient Stated Goal: agreed to walk in room PT Goal Formulation: Patient unable to participate in goal setting Time For Goal Achievement: 07/21/20 Potential to Achieve Goals: Good    Frequency Min 3X/week   Barriers to discharge        Co-evaluation               AM-PAC PT "6 Clicks"  Mobility  Outcome Measure Help needed turning from your back to your side while in a flat bed without using bedrails?: A Little Help needed moving from lying on your back to sitting on the side of a flat bed without using bedrails?: A Little Help needed moving to and from a bed to a chair (including a wheelchair)?: A Little Help needed standing up from a chair using your arms (e.g., wheelchair or bedside chair)?: A Little Help needed to walk in hospital room?: A Little Help needed climbing 3-5 steps with a railing? : A Little 6 Click Score: 18    End of Session Equipment Utilized During Treatment: Gait belt Activity Tolerance: Patient tolerated treatment well Patient left: in chair;with call bell/phone within reach;with chair alarm set Nurse Communication: Mobility status PT Visit Diagnosis: Unsteadiness on feet (R26.81)    Time: 8546-2703 PT Time Calculation (min) (ACUTE ONLY): 26 min   Charges:   PT Evaluation $PT Eval Low Complexity: 1 Low          Blanchard Kelch PT Acute Rehabilitation Services Pager [phone] Office [phone]   Rada Hay 07/07/2020, 10:41 AM

## 2020-07-07 NOTE — TOC Transition Note (Addendum)
Transition of Care [facility]) - CM/SW Discharge Note   Patient Details  Name: Bridget [name] MRN: [id number] Date of Birth: 11-30-1925  Transition of Care [facility]) CM/SW Contact:  Ida Rogue, LCSW Phone Number: 07/07/2020, 12:44 PM   Clinical Narrative:   Daughter of patient called in follow up to PT recommendation of HH PT.  [name] welcomes any services we can help with in home-she is open to [facility] PT and would like an aide to help with hygiene as her mother is refusing to get into the tub or shower at home.  She is also open to DME RW.  Contacted [employer] for the walker and Cindie with [employer] for the [facility] services.  [name] is filling an order today and cannot get her until around 6PM to pick up patient.  I told her to call to the floor and we would bring patient down to her at front entrance. No further needs identified  TOC sign off.  Addendum:  Contacted PCP office to confirm that they would sign off on HH orders.  Request confirmed.     Final next level of care: Home w Home Health Services Barriers to Discharge: No Barriers Identified   Patient Goals and CMS Choice        Discharge Placement                       Discharge Plan and Services                                     Social Determinants of Health (SDOH) Interventions     Readmission Risk Interventions No flowsheet data found.

## 2020-07-07 NOTE — Progress Notes (Signed)
SATURATION QUALIFICATIONS: (This note is used to comply with regulatory documentation for home oxygen)  Patient Saturations on Room Air at Rest = 94%  Patient Saturations on Room Air while Ambulating =89%    

## 2020-07-07 NOTE — Discharge Instructions (Signed)
Bridget [name],  You were in the hospital with a COVID-19 infection. You had mild symptoms. You received Remdesivir and steroids. You were also found to have evidence of a UTI for which you are prescribed antibiotics. You can complete a total 10 day course of decadron as well. I recommend a 10 day quarantine duration since your symptoms were mild. Home health physical therapy and a rolling walker have been ordered for you. If you have any worsening trouble breathing, please call your PCP or return to the ED for reevaluation.  Jacquelin Hawking, MD [facility]

## 2020-07-07 NOTE — Discharge Summary (Signed)
Physician Discharge Summary  Bridget Pattydith G Pascarella ZOX:[id number] DOB: 10/01/1925 DOA: 07/05/2020  PCP: Glori Bickersrivedi, Rajendra, MD  Admit date: 07/05/2020 Discharge date: 07/07/2020  Admitted From: Home Disposition: Home  Recommendations for Outpatient Follow-up:  1. Follow up with PCP in 1 week 2. Please follow up on the following pending results: None  Home Health: PT Equipment/Devices: Rolling walker  Discharge Condition: Stable CODE STATUS: Full code Diet recommendation: heart healthy   Brief/Interim Summary:  Admission HPI written by Almon Herculesaye T Gonfa, MD  HPI: Bridget [name] is a 87 y.o. female with medical history significant of COPD/asthma, HTN, mild dementia.  Pt resides at home with daughter.  Pt presents to ED with c/o 3 day h/o cough, generalized weakness, fatigue.  Symptoms onset a few days ago, persistent, worsening.  Associated poor PO intake, diarrhea.  Symptoms constant, now severe.  No known fevers, chills, body aches, sore throat.  Possible COVID exposure x1 week ago.   Hospital course:  Acute respiratory disease secondary COVID-19 infection No overt pneumonia. Patient started on Remdesivir and steroids for hypoxia. She received three doses of Remdesivir. She was weaned to room air and did not qualify for home oxygen during ambulatory pulse oximetry test. Discharge with decadron burst.  COVID-19 infection No overt pneumonia but does have hypoxia. Decadron prescribed on discharge.  UTI Klebsiella pneumoniae infection. Empirically treated with Ceftriaxone IV x2 days. Discharge with Keflex to complete a three day treatment course.  Primary hypertension Continue lisinopril-hydrochlorothiazide   Discharge Diagnoses:  Principal Problem:   Acute respiratory disease due to COVID-19 virus Active Problems:   HTN (hypertension)   Acute lower UTI    Discharge Instructions  Discharge Instructions    Ambulatory referral for Covid Treatment   Complete by: As  directed    Date of Symptom Onset: 07/02/2020   Has the patient previously (in the past 6 months) or do they currently receive IV therapy for a chronic condition?: No   Does the patient have any other medical conditions: Yes   Please list any other medical conditions: COPD, hypertension   Has the patient received the COVID vaccine?: No   Call MD for:  difficulty breathing, headache or visual disturbances   Complete by: As directed    Increase activity slowly   Complete by: As directed      Allergies as of 07/07/2020   No Known Allergies     Medication List    TAKE these medications   cephALEXin 500 MG capsule Commonly known as: Keflex Take 1 capsule (500 mg total) by mouth 2 (two) times daily for 1 day. Start taking on: July 08, 2020   dexamethasone 6 MG tablet Commonly known as: DECADRON Take 1 tablet (6 mg total) by mouth daily for 8 days. Start taking on: July 08, 2020   ipratropium-albuterol 0.5-2.5 (3) MG/3ML Soln Commonly known as: DUONEB Take 3 mLs by nebulization 4 (four) times daily.   lisinopril-hydrochlorothiazide 20-12.5 MG tablet Commonly known as: ZESTORETIC Take 1 tablet by mouth daily.            Durable Medical Equipment  (From admission, onward)         Start     Ordered   07/07/20 1201  For home use only DME Walker rolling  Once       Question Answer Comment  Walker: With 5 Inch Wheels   Patient needs a walker to treat with the following condition COVID-19 virus infection      07/07/20  1201          Follow-up Information    Glori Bickers, MD Follow up.   Specialty: Family Medicine Contact information: [address] [place] [address] [phone]              No Known Allergies  Consultations:  None   Procedures/Studies: DG Chest Port 1 View  Result Date: 07/05/2020 CLINICAL DATA:  87 year old female with possible sepsis. Fatigue. Decreased appetite. EXAM: PORTABLE CHEST 1 VIEW COMPARISON:  No  priors. FINDINGS: No acute consolidative airspace disease. Mild diffuse peribronchial cuffing. No pleural effusions. No evidence of pulmonary edema. No pneumothorax. Mild cardiomegaly. Upper mediastinal contours are within normal limits. Aortic atherosclerosis. Orthopedic fixation hardware in the proximal left humerus incompletely imaged. IMPRESSION: 1. Diffuse peribronchial cuffing which could be seen in the setting of acute or chronic bronchitis. 2. Mild cardiomegaly. 3. Aortic atherosclerosis. Electronically Signed   By: Trudie Reed M.D.   On: 07/05/2020 14:30     Subjective: No dyspnea or chest pain. Feels great.  Discharge Exam: Vitals:   07/07/20 0440 07/07/20 0859  BP: 128/81 133/77  Pulse: 73 74  Resp: 19   Temp:    SpO2: 99%    Vitals:   07/06/20 2205 07/06/20 2315 07/07/20 0440 07/07/20 0859  BP: (!) 174/86 (!) 160/76 128/81 133/77  Pulse: 80  73 74  Resp: 20  19   Temp: 97.8 F (36.6 C)     TempSrc:      SpO2: 100%  99%   Weight:      Height:        General: Pt is alert, awake, not in acute distress Cardiovascular: RRR, S1/S2 +, no rubs, no gallops Respiratory: CTA bilaterally, no wheezing, no rhonchi Abdominal: Soft, NT, ND, bowel sounds + Extremities: no edema, no cyanosis    The results of significant diagnostics from this hospitalization (including imaging, microbiology, ancillary and laboratory) are listed below for reference.     Microbiology: Recent Results (from the past 240 hour(s))  Urine culture     Status: Abnormal   Collection Time: 07/05/20 12:32 PM   Specimen: In/Out Cath Urine  Result Value Ref Range Status   Specimen Description   Final    IN/OUT CATH URINE Performed at [facility], [address] [place], [place] [address]    Special Requests   Final    NONE Performed at [facility], [address] [place], [place] [address]    Culture >=100,000 COLONIES/mL KLEBSIELLA PNEUMONIAE (A)  Final   Report  Status 07/07/2020 FINAL  Final   Organism ID, Bacteria KLEBSIELLA PNEUMONIAE (A)  Final      Susceptibility   Klebsiella pneumoniae - MIC*    AMPICILLIN RESISTANT Resistant     CEFAZOLIN <=4 SENSITIVE Sensitive     CEFEPIME <=0.12 SENSITIVE Sensitive     CEFTRIAXONE <=0.25 SENSITIVE Sensitive     CIPROFLOXACIN <=0.25 SENSITIVE Sensitive     GENTAMICIN <=1 SENSITIVE Sensitive     IMIPENEM <=0.25 SENSITIVE Sensitive     NITROFURANTOIN 64 INTERMEDIATE Intermediate     TRIMETH/SULFA <=20 SENSITIVE Sensitive     AMPICILLIN/SULBACTAM <=2 SENSITIVE Sensitive     PIP/TAZO <=4 SENSITIVE Sensitive     * >=100,000 COLONIES/mL KLEBSIELLA PNEUMONIAE  Blood Culture (routine x 2)     Status: None (Preliminary result)   Collection Time: 07/05/20  2:32 PM   Specimen: BLOOD  Result Value Ref Range Status  Specimen Description   Final    BLOOD LEFT ANTECUBITAL Performed at [facility], [address], [place], [place] [address]    Special Requests   Final    BOTTLES DRAWN AEROBIC AND ANAEROBIC Blood Culture adequate volume Performed at [facility], [address] [place], [place] [address]    Culture   Final    NO GROWTH < 24 HOURS Performed at [facility], [address] [place], [place] [address]    Report Status PENDING  Incomplete  SARS Coronavirus 2 by RT PCR (hospital order, performed in [facility] hospital lab) Nasopharyngeal Nasopharyngeal Swab     Status: Abnormal   Collection Time: 07/05/20  2:32 PM   Specimen: Nasopharyngeal Swab  Result Value Ref Range Status   SARS Coronavirus 2 POSITIVE (A) NEGATIVE Final    Comment: RESULT CALLED TO, READ BACK BY AND VERIFIED WITH: Heywood Bene, RN AT 1526 ON [id number] BY BOWLBY, J (NOTE) SARS-CoV-2 target nucleic acids are DETECTED  SARS-CoV-2 RNA is generally detectable in upper respiratory specimens  during the acute phase of infection.  Positive results are indicative  of the presence of the identified virus, but  do not rule out bacterial infection or co-infection with other pathogens not detected by the test.  Clinical correlation with patient history and  other diagnostic information is necessary to determine patient infection status.  The expected result is negative.  Fact Sheet for Patients:   BoilerBrush.com.cy   Fact Sheet for Healthcare Providers:   https://pope.com/    This test is not yet approved or cleared by the [place] FDA and  has been authorized for detection and/or diagnosis of SARS-CoV-2 by FDA under an Emergency Use Authorization (EUA).  This EUA will remain in effect (mean ing this test can be used) for the duration of  the COVID-19 declaration under Section 564(b)(1) of the Act, 21 U.S.C. section 360-bbb-3(b)(1), unless the authorization is terminated or revoked sooner.  Performed at [facility], [address] [place], [place] [address]   Blood Culture (routine x 2)     Status: None (Preliminary result)   Collection Time: 07/05/20  2:42 PM   Specimen: BLOOD RIGHT ARM  Result Value Ref Range Status   Specimen Description   Final    BLOOD RIGHT ARM Performed at [facility], [address] [place], [place] [address]    Special Requests   Final    BOTTLES DRAWN AEROBIC AND ANAEROBIC Blood Culture adequate volume Performed at [facility], [address] [place], [place] [address]    Culture   Final    NO GROWTH < 24 HOURS Performed at [facility], [address], [place], [place] [address]    Report Status PENDING  Incomplete     Labs: BNP (last 3 results) No results for input(s): BNP in the last 8760 hours. Basic Metabolic Panel: Recent Labs  Lab 07/05/20 1232 07/05/20 1620 07/06/20 0352 07/07/20 0427  NA 134* 133* 136 140  K 4.0 3.5 3.7 4.3  CL 95* 96* 100 101  CO2 26 26 25 29   GLUCOSE 111* 100* 111* 133*  BUN 28* 26* 25* 29*  CREATININE 1.02* 0.90 0.69  0.74  CALCIUM 8.7* 8.3* 8.1* 8.7*   Liver Function Tests: Recent Labs  Lab 07/05/20 1620 07/06/20 0352 07/07/20 0427  AST 28 28 28   ALT 14 15 19   ALKPHOS 49 45  48  BILITOT 0.6 0.6 0.4  PROT 7.6 6.7 7.4  ALBUMIN 3.8 3.2* 3.6   No results for input(s): LIPASE, AMYLASE in the last 168 hours. No results for input(s): AMMONIA in the last 168 hours. CBC: Recent Labs  Lab 07/05/20 1232 07/06/20 0352 07/07/20 0427  WBC 10.7* 5.8 6.6  NEUTROABS 8.7* 5.1 5.9  HGB 15.7* 14.7 15.2*  HCT 46.8* 45.2 47.7*  MCV 93.4 95.6 95.6  PLT 157 149* 199   Cardiac Enzymes: No results for input(s): CKTOTAL, CKMB, CKMBINDEX, TROPONINI in the last 168 hours. BNP: Invalid input(s): POCBNP CBG: No results for input(s): GLUCAP in the last 168 hours. D-Dimer Recent Labs    07/06/20 0352 07/07/20 0427  DDIMER 1.26* 0.92*   Hgb A1c No results for input(s): HGBA1C in the last 72 hours. Lipid Profile Recent Labs    07/05/20 1620  TRIG 59   Thyroid function studies No results for input(s): TSH, T4TOTAL, T3FREE, THYROIDAB in the last 72 hours.  Invalid input(s): FREET3 Anemia work up Recent Labs    07/05/20 1620  FERRITIN 423*   Urinalysis    Component Value Date/Time   COLORURINE YELLOW 07/05/2020 1232   APPEARANCEUR CLOUDY (A) 07/05/2020 1232   LABSPEC 1.030 07/05/2020 1232   PHURINE 5.0 07/05/2020 1232   GLUCOSEU NEGATIVE 07/05/2020 1232   HGBUR SMALL (A) 07/05/2020 1232   BILIRUBINUR NEGATIVE 07/05/2020 1232   KETONESUR NEGATIVE 07/05/2020 1232   PROTEINUR >300 (A) 07/05/2020 1232   NITRITE NEGATIVE 07/05/2020 1232   LEUKOCYTESUR SMALL (A) 07/05/2020 1232   Sepsis Labs Invalid input(s): PROCALCITONIN,  WBC,  LACTICIDVEN Microbiology Recent Results (from the past 240 hour(s))  Urine culture     Status: Abnormal   Collection Time: 07/05/20 12:32 PM   Specimen: In/Out Cath Urine  Result Value Ref Range Status   Specimen Description   Final    IN/OUT CATH  URINE Performed at [facility] [place] [facility], [address] [place], [place] [address]    Special Requests   Final    NONE Performed at [facility], [address] [place], [place] [address]    Culture >=100,000 COLONIES/mL KLEBSIELLA PNEUMONIAE (A)  Final   Report Status 07/07/2020 FINAL  Final   Organism ID, Bacteria KLEBSIELLA PNEUMONIAE (A)  Final      Susceptibility   Klebsiella pneumoniae - MIC*    AMPICILLIN RESISTANT Resistant     CEFAZOLIN <=4 SENSITIVE Sensitive     CEFEPIME <=0.12 SENSITIVE Sensitive     CEFTRIAXONE <=0.25 SENSITIVE Sensitive     CIPROFLOXACIN <=0.25 SENSITIVE Sensitive     GENTAMICIN <=1 SENSITIVE Sensitive     IMIPENEM <=0.25 SENSITIVE Sensitive     NITROFURANTOIN 64 INTERMEDIATE Intermediate     TRIMETH/SULFA <=20 SENSITIVE Sensitive     AMPICILLIN/SULBACTAM <=2 SENSITIVE Sensitive     PIP/TAZO <=4 SENSITIVE Sensitive     * >=100,000 COLONIES/mL KLEBSIELLA PNEUMONIAE  Blood Culture (routine x 2)     Status: None (Preliminary result)   Collection Time: 07/05/20  2:32 PM   Specimen: BLOOD  Result Value Ref Range Status   Specimen Description   Final    BLOOD LEFT ANTECUBITAL Performed at [facility], [address] [place], [place] [address]    Special Requests   Final    BOTTLES DRAWN AEROBIC AND ANAEROBIC Blood Culture adequate volume Performed at [facility] [place], [address] [place], [place] [address]    Culture  Final    NO GROWTH < 24 HOURS Performed at [facility], [address], [place], [place] [address]    Report Status PENDING  Incomplete  SARS Coronavirus 2 by RT PCR (hospital order, performed in [facility] hospital lab) Nasopharyngeal Nasopharyngeal Swab     Status: Abnormal   Collection Time: 07/05/20  2:32 PM   Specimen: Nasopharyngeal Swab  Result Value Ref Range Status   SARS Coronavirus 2 POSITIVE (A) NEGATIVE Final    Comment: RESULT CALLED TO, READ BACK BY AND VERIFIED  WITH: Heywood Bene, RN AT 1526 ON [id number] BY BOWLBY, J (NOTE) SARS-CoV-2 target nucleic acids are DETECTED  SARS-CoV-2 RNA is generally detectable in upper respiratory specimens  during the acute phase of infection.  Positive results are indicative  of the presence of the identified virus, but do not rule out bacterial infection or co-infection with other pathogens not detected by the test.  Clinical correlation with patient history and  other diagnostic information is necessary to determine patient infection status.  The expected result is negative.  Fact Sheet for Patients:   BoilerBrush.com.cy   Fact Sheet for Healthcare Providers:   https://pope.com/    This test is not yet approved or cleared by the [place] FDA and  has been authorized for detection and/or diagnosis of SARS-CoV-2 by FDA under an Emergency Use Authorization (EUA).  This EUA will remain in effect (mean ing this test can be used) for the duration of  the COVID-19 declaration under Section 564(b)(1) of the Act, 21 U.S.C. section 360-bbb-3(b)(1), unless the authorization is terminated or revoked sooner.  Performed at [facility], [address] [place], [place] [address]   Blood Culture (routine x 2)     Status: None (Preliminary result)   Collection Time: 07/05/20  2:42 PM   Specimen: BLOOD RIGHT ARM  Result Value Ref Range Status   Specimen Description   Final    BLOOD RIGHT ARM Performed at [facility], [address] [place], [place] [address]    Special Requests   Final    BOTTLES DRAWN AEROBIC AND ANAEROBIC Blood Culture adequate volume Performed at [facility], [address] [place], [place] [address]    Culture   Final    NO GROWTH < 24 HOURS Performed at [facility], [address], [place], [place] [address]    Report Status PENDING  Incomplete     Time coordinating discharge: 35  minutes  SIGNED:   Jacquelin Hawking, MD [facility] 07/07/2020, 12:05 PM

## 2020-07-10 LAB — CULTURE, BLOOD (ROUTINE X 2)
Culture: NO GROWTH
Culture: NO GROWTH
Special Requests: ADEQUATE
Special Requests: ADEQUATE

## 2020-08-31 ENCOUNTER — Observation Stay
Admit: 2020-08-31 | Discharge: 2020-08-31 | Disposition: A | Discharge status: Home / Self Care | Payer: Medicare Other | Attending: Internal Medicine | Admitting: Internal Medicine

## 2020-08-31 ENCOUNTER — Emergency Department: Payer: Medicare Other

## 2020-08-31 ENCOUNTER — Other Ambulatory Visit: Payer: Self-pay

## 2020-08-31 ENCOUNTER — Observation Stay
Admission: EM | Admit: 2020-08-31 | Discharge: 2020-09-04 | Disposition: A | Discharge status: Home / Self Care | Payer: Medicare Other | Attending: Internal Medicine | Admitting: Internal Medicine

## 2020-08-31 DIAGNOSIS — J45909 Unspecified asthma, uncomplicated: Secondary | ICD-10-CM | POA: Diagnosis not present

## 2020-08-31 DIAGNOSIS — N1832 Chronic kidney disease, stage 3b: Secondary | ICD-10-CM | POA: Diagnosis not present

## 2020-08-31 DIAGNOSIS — Z79899 Other long term (current) drug therapy: Secondary | ICD-10-CM | POA: Insufficient documentation

## 2020-08-31 DIAGNOSIS — Z87891 Personal history of nicotine dependence: Secondary | ICD-10-CM | POA: Diagnosis not present

## 2020-08-31 DIAGNOSIS — N1831 Chronic kidney disease, stage 3a: Secondary | ICD-10-CM | POA: Diagnosis not present

## 2020-08-31 DIAGNOSIS — I130 Hypertensive heart and chronic kidney disease with heart failure and stage 1 through stage 4 chronic kidney disease, or unspecified chronic kidney disease: Secondary | ICD-10-CM | POA: Diagnosis not present

## 2020-08-31 DIAGNOSIS — I5033 Acute on chronic diastolic (congestive) heart failure: Secondary | ICD-10-CM | POA: Insufficient documentation

## 2020-08-31 DIAGNOSIS — I509 Heart failure, unspecified: Secondary | ICD-10-CM | POA: Diagnosis not present

## 2020-08-31 DIAGNOSIS — Z20822 Contact with and (suspected) exposure to covid-19: Secondary | ICD-10-CM | POA: Insufficient documentation

## 2020-08-31 DIAGNOSIS — I10 Essential (primary) hypertension: Secondary | ICD-10-CM | POA: Diagnosis present

## 2020-08-31 DIAGNOSIS — J441 Chronic obstructive pulmonary disease with (acute) exacerbation: Principal | ICD-10-CM | POA: Diagnosis present

## 2020-08-31 DIAGNOSIS — R2681 Unsteadiness on feet: Secondary | ICD-10-CM | POA: Insufficient documentation

## 2020-08-31 DIAGNOSIS — J9601 Acute respiratory failure with hypoxia: Secondary | ICD-10-CM | POA: Insufficient documentation

## 2020-08-31 DIAGNOSIS — I482 Chronic atrial fibrillation, unspecified: Secondary | ICD-10-CM | POA: Diagnosis present

## 2020-08-31 DIAGNOSIS — N183 Chronic kidney disease, stage 3 unspecified: Secondary | ICD-10-CM | POA: Diagnosis present

## 2020-08-31 DIAGNOSIS — Z7951 Long term (current) use of inhaled steroids: Secondary | ICD-10-CM | POA: Insufficient documentation

## 2020-08-31 DIAGNOSIS — R0602 Shortness of breath: Secondary | ICD-10-CM | POA: Diagnosis present

## 2020-08-31 HISTORY — DX: Chronic obstructive pulmonary disease, unspecified: J44.9

## 2020-08-31 LAB — CBC WITH DIFFERENTIAL/PLATELET
Abs Immature Granulocytes: 0.03 10*3/uL (ref 0.00–0.07)
Basophils Absolute: 0.1 10*3/uL (ref 0.0–0.1)
Basophils Relative: 1 %
Eosinophils Absolute: 0.5 10*3/uL (ref 0.0–0.5)
Eosinophils Relative: 6 %
HCT: 48.6 % — ABNORMAL HIGH (ref 36.0–46.0)
Hemoglobin: 15.9 g/dL — ABNORMAL HIGH (ref 12.0–15.0)
Immature Granulocytes: 0 %
Lymphocytes Relative: 14 %
Lymphs Abs: 1.2 10*3/uL (ref 0.7–4.0)
MCH: 31.1 pg (ref 26.0–34.0)
MCHC: 32.7 g/dL (ref 30.0–36.0)
MCV: 94.9 fL (ref 80.0–100.0)
Monocytes Absolute: 0.7 10*3/uL (ref 0.1–1.0)
Monocytes Relative: 7 %
Neutro Abs: 6.3 10*3/uL (ref 1.7–7.7)
Neutrophils Relative %: 72 %
Platelets: 182 10*3/uL (ref 150–400)
RBC: 5.12 MIL/uL — ABNORMAL HIGH (ref 3.87–5.11)
RDW: 14.1 % (ref 11.5–15.5)
WBC: 8.9 10*3/uL (ref 4.0–10.5)
nRBC: 0 % (ref 0.0–0.2)

## 2020-08-31 LAB — BASIC METABOLIC PANEL
Anion gap: 8 (ref 5–15)
BUN: 24 mg/dL — ABNORMAL HIGH (ref 8–23)
CO2: 27 mmol/L (ref 22–32)
Calcium: 9.5 mg/dL (ref 8.9–10.3)
Chloride: 98 mmol/L (ref 98–111)
Creatinine, Ser: 0.91 mg/dL (ref 0.44–1.00)
GFR, Estimated: 59 mL/min — ABNORMAL LOW (ref >60)
Glucose, Bld: 144 mg/dL — ABNORMAL HIGH (ref 70–99)
Potassium: 4.2 mmol/L (ref 3.5–5.1)
Sodium: 133 mmol/L — ABNORMAL LOW (ref 135–145)

## 2020-08-31 LAB — TROPONIN I (HIGH SENSITIVITY)
Troponin I (High Sensitivity): 19 ng/L — ABNORMAL HIGH (ref <18)
Troponin I (High Sensitivity): 22 ng/L — ABNORMAL HIGH (ref <18)

## 2020-08-31 LAB — BRAIN NATRIURETIC PEPTIDE: B Natriuretic Peptide: 691 pg/mL — ABNORMAL HIGH (ref 0.0–100.0)

## 2020-08-31 LAB — SARS CORONAVIRUS 2 (TAT 6-24 HRS): SARS Coronavirus 2: NEGATIVE

## 2020-08-31 MED ORDER — ADULT MULTIVITAMIN W/MINERALS CH
1.0000 | ORAL_TABLET | Freq: Every day | ORAL | Status: DC
  Administered 2020-09-01 – 2020-09-04 (×4): 1 via ORAL
  Filled 2020-08-31 (×4): qty 1

## 2020-08-31 MED ORDER — METHYLPREDNISOLONE SODIUM SUCC 125 MG IJ SOLR
125.0000 mg | Freq: Once | INTRAMUSCULAR | Status: AC
  Administered 2020-08-31: 125 mg via INTRAVENOUS
  Filled 2020-08-31: qty 2

## 2020-08-31 MED ORDER — IPRATROPIUM-ALBUTEROL 0.5-2.5 (3) MG/3ML IN SOLN: 3.0000 mL | Freq: Four times a day (QID) | RESPIRATORY_TRACT | Status: DC

## 2020-08-31 MED ORDER — SODIUM CHLORIDE 0.9 % IV SOLN: 250.0000 mL | INTRAVENOUS | Status: DC | PRN

## 2020-08-31 MED ORDER — SODIUM CHLORIDE 0.9% FLUSH
3.0000 mL | Freq: Two times a day (BID) | INTRAVENOUS | Status: DC
  Administered 2020-08-31 – 2020-09-04 (×9): 3 mL via INTRAVENOUS

## 2020-08-31 MED ORDER — FUROSEMIDE 10 MG/ML IJ SOLN
40.0000 mg | Freq: Every day | INTRAMUSCULAR | Status: DC
  Administered 2020-08-31 – 2020-09-04 (×5): 40 mg via INTRAVENOUS
  Filled 2020-08-31 (×5): qty 4

## 2020-08-31 MED ORDER — AEROCHAMBER PLUS FLO-VU MEDIUM MISC
1.0000 | Freq: Once | Status: DC
  Filled 2020-08-31: qty 1

## 2020-08-31 MED ORDER — FUROSEMIDE 10 MG/ML IJ SOLN: 40.0000 mg | Freq: Once | INTRAMUSCULAR | Status: DC

## 2020-08-31 MED ORDER — GUAIFENESIN-DM 100-10 MG/5ML PO SYRP
5.0000 mL | ORAL_SOLUTION | ORAL | Status: DC | PRN
  Administered 2020-09-01 – 2020-09-03 (×2): 5 mL via ORAL
  Filled 2020-08-31 (×2): qty 5

## 2020-08-31 MED ORDER — LISINOPRIL-HYDROCHLOROTHIAZIDE 20-12.5 MG PO TABS: 1.0000 | ORAL_TABLET | Freq: Every day | ORAL | Status: DC

## 2020-08-31 MED ORDER — ENOXAPARIN SODIUM 40 MG/0.4ML ~~LOC~~ SOLN
40.0000 mg | SUBCUTANEOUS | Status: DC
  Administered 2020-08-31 – 2020-09-04 (×5): 40 mg via SUBCUTANEOUS
  Filled 2020-08-31 (×5): qty 0.4

## 2020-08-31 MED ORDER — METHYLPREDNISOLONE SODIUM SUCC 40 MG IJ SOLR
40.0000 mg | Freq: Two times a day (BID) | INTRAMUSCULAR | Status: AC
  Administered 2020-08-31 – 2020-09-01 (×2): 40 mg via INTRAVENOUS
  Filled 2020-08-31 (×2): qty 1

## 2020-08-31 MED ORDER — SODIUM CHLORIDE 0.9% FLUSH
3.0000 mL | INTRAVENOUS | Status: DC | PRN
  Administered 2020-09-01: 21:00:00 3 mL via INTRAVENOUS

## 2020-08-31 MED ORDER — IPRATROPIUM-ALBUTEROL 0.5-2.5 (3) MG/3ML IN SOLN: 3.0000 mL | Freq: Once | RESPIRATORY_TRACT | Status: DC

## 2020-08-31 MED ORDER — ALBUTEROL SULFATE (2.5 MG/3ML) 0.083% IN NEBU: 2.5000 mg | INHALATION_SOLUTION | RESPIRATORY_TRACT | 2 refills | Status: DC | PRN

## 2020-08-31 MED ORDER — ALBUTEROL SULFATE (2.5 MG/3ML) 0.083% IN NEBU
5.0000 mg | INHALATION_SOLUTION | Freq: Once | RESPIRATORY_TRACT | Status: AC
  Administered 2020-08-31: 5 mg via RESPIRATORY_TRACT
  Filled 2020-08-31: qty 6

## 2020-08-31 MED ORDER — IPRATROPIUM BROMIDE 0.02 % IN SOLN
0.5000 mg | Freq: Once | RESPIRATORY_TRACT | Status: AC
  Administered 2020-08-31: 0.5 mg via RESPIRATORY_TRACT
  Filled 2020-08-31: qty 2.5

## 2020-08-31 MED ORDER — LISINOPRIL 20 MG PO TABS
20.0000 mg | ORAL_TABLET | Freq: Every day | ORAL | Status: DC
  Administered 2020-08-31 – 2020-09-04 (×5): 20 mg via ORAL
  Filled 2020-08-31 (×5): qty 1

## 2020-08-31 MED ORDER — PREDNISONE 20 MG PO TABS
40.0000 mg | ORAL_TABLET | Freq: Every day | ORAL | Status: AC
  Administered 2020-09-01 – 2020-09-04 (×4): 40 mg via ORAL
  Filled 2020-08-31 (×4): qty 2

## 2020-08-31 MED ORDER — ENSURE ENLIVE PO LIQD
237.0000 mL | Freq: Two times a day (BID) | ORAL | Status: DC
  Administered 2020-08-31 – 2020-09-04 (×8): 237 mL via ORAL

## 2020-08-31 MED ORDER — ALBUTEROL SULFATE (2.5 MG/3ML) 0.083% IN NEBU
2.5000 mg | INHALATION_SOLUTION | RESPIRATORY_TRACT | Status: DC | PRN
  Administered 2020-09-01 – 2020-09-02 (×2): 2.5 mg via RESPIRATORY_TRACT
  Filled 2020-08-31 (×2): qty 3

## 2020-08-31 NOTE — ED Provider Notes (Addendum)
[facility] Emergency Department Provider Note  ____________________________________________   Event Date/Time   First MD Initiated Contact with Patient 08/31/20 0600     (approximate)  I have reviewed the triage vital signs and the nursing notes.   HISTORY  Chief Complaint Shortness of Breath    HPI Bridget [name] is a 87 y.o. female with history of hypertension, asthma, COPD not on home oxygen who presents to the emergency department with complaints of intermittent shortness of breath for the past couple of days.  Symptoms worse with exertion.  Patient noted to be speaking in truncated sentences in triage.  She has been wheezing.  No fevers, cough, chest discomfort.  She does have lower extremity swelling bilaterally which is symmetric and unchanged per her daughter.  Reports they tried an albuterol inhaler at home without much relief.  They have ipratropium for the nebulizer machine which has not been helping.  They do not have any albuterol for the nebulizer.    Patient had Covid in end of January 2022.    Past Medical History:  Diagnosis Date  . Asthma   . COPD (chronic obstructive pulmonary disease) (HCC)   . Hypertension     Patient Active Problem List   Diagnosis Date Noted  . HTN (hypertension) 07/06/2020  . Acute lower UTI 07/06/2020  . Acute respiratory disease due to COVID-19 virus 07/05/2020    Past Surgical History:  Procedure Laterality Date  . APPENDECTOMY    . BREAST SURGERY    . HERNIA REPAIR      Prior to Admission medications   Medication Sig Start Date End Date Taking? Authorizing Provider  albuterol (PROVENTIL) (2.5 MG/3ML) 0.083% nebulizer solution Take 3 mLs (2.5 mg total) by nebulization every 4 (four) hours as needed for wheezing or shortness of breath. 08/31/20 08/31/21 Yes Cormac Wint N, DO  ipratropium-albuterol (DUONEB) 0.5-2.5 (3) MG/3ML SOLN Take 3 mLs by nebulization 4 (four) times daily. 02/24/20   [provider]  lisinopril-hydrochlorothiazide (ZESTORETIC) 20-12.5 MG tablet Take 1 tablet by mouth daily. 06/15/20   [provider]    Allergies Patient has no known allergies.  Family History  Problem Relation Age of Onset  . Immunocompromised Neg Hx     Social History Social History   Tobacco Use  . Smoking status: Former Games developer  . Smokeless tobacco: Never Used  Substance Use Topics  . Alcohol use: Never  . Drug use: Never    Review of Systems Constitutional: No fever. Eyes: No visual changes. ENT: No sore throat. Cardiovascular: Denies chest pain. Respiratory: + shortness of breath. Gastrointestinal: No nausea, vomiting, diarrhea. Genitourinary: Negative for dysuria. Musculoskeletal: Negative for back pain. Skin: Negative for rash. Neurological: Negative for focal weakness or numbness.  ____________________________________________   PHYSICAL EXAM:  VITAL SIGNS: ED Triage Vitals [08/31/20 0533]  Enc Vitals Group     BP (!) 172/109     Pulse Rate (!) 102     Resp (!) 24     Temp 98.1 F (36.7 C)     Temp Source Oral     SpO2 93 %     Weight 125 lb (56.7 kg)     Height 5\' 4"  (1.626 m)     Head Circumference      Peak Flow      Pain Score 0     Pain Loc      Pain Edu?      Excl. in GC?    CONSTITUTIONAL:  Alert and oriented and responds appropriately to questions.  Elderly, afebrile HEAD: Normocephalic EYES: Conjunctivae clear, pupils appear equal, EOM appear intact ENT: normal nose; moist mucous membranes NECK: Supple, normal ROM CARD: RRR; S1 and S2 appreciated; no murmurs, no clicks, no rubs, no gallops RESP: Patient is tachypneic with any minimal exertion.  She is diminished at her bases bilaterally and has inspiratory and expiratory wheezes scattered diffusely.  No rhonchi or rales appreciated.  No hypoxia on room air at rest.  No significant increased work of breathing. ABD/GI: Normal bowel sounds; non-distended; soft, non-tender, no  rebound, no guarding, no peritoneal signs, no hepatosplenomegaly BACK: The back appears normal EXT: Normal ROM in all joints; no deformity noted, symmetric edema in bilateral lower extremities that is nonpitting, no calf tenderness or unilateral calf swelling SKIN: Normal color for age and race; warm; no rash on exposed skin NEURO: Moves all extremities equally PSYCH: The patient's mood and manner are appropriate.  ____________________________________________   LABS (all labs ordered are listed, but only abnormal results are displayed)  Labs Reviewed  BASIC METABOLIC PANEL - Abnormal; Notable for the following components:      Result Value   Sodium 133 (*)    Glucose, Bld 144 (*)    BUN 24 (*)    GFR, Estimated 59 (*)    All other components within normal limits  CBC WITH DIFFERENTIAL/PLATELET - Abnormal; Notable for the following components:   RBC 5.12 (*)    Hemoglobin 15.9 (*)    HCT 48.6 (*)    All other components within normal limits  BRAIN NATRIURETIC PEPTIDE - Abnormal; Notable for the following components:   B Natriuretic Peptide 691.0 (*)    All other components within normal limits  TROPONIN I (HIGH SENSITIVITY) - Abnormal; Notable for the following components:   Troponin I (High Sensitivity) 19 (*)    All other components within normal limits  TROPONIN I (HIGH SENSITIVITY)   ____________________________________________  EKG   EKG Interpretation  Date/Time:  Monday August 31 2020 05:47:52 EDT Ventricular Rate:  82 PR Interval:    QRS Duration: 74 QT Interval:  358 QTC Calculation: 418 R Axis:   166 Text Interpretation: Atrial fibrillation Possible Right ventricular hypertrophy Lateral infarct , age undetermined Abnormal ECG No significant change since last tracing Confirmed by Rochele Raring [phone]) on 08/31/2020 6:51:57 AM       ____________________________________________  RADIOLOGY Normajean Baxter Kaytelyn Glore, personally viewed and evaluated these images (plain  radiographs) as part of my medical decision making, as well as reviewing the written report by the radiologist.  ED MD interpretation: Chest x-ray shows no infiltrate, edema.  Official radiology report(s): DG Chest 2 View  Result Date: 08/31/2020 CLINICAL DATA:  87 year old female with shortness of breath since 20/1 100 hours. EXAM: CHEST - 2 VIEW COMPARISON:  Portable chest 07/05/2020. FINDINGS: Seated AP and lateral views of the chest. Improved left lung base ventilation since January, resolved indistinct opacity there. Increased AP dimension to the chest with exaggerated thoracic kyphosis. Mediastinal contours are stable and within normal limits. Calcified aortic atherosclerosis. No pneumothorax, pulmonary edema, pleural effusion or acute pulmonary opacity. Previous left humerus ORIF. Osteopenia. No acute osseous abnormality identified. Negative visible bowel gas. IMPRESSION: 1. Pulmonary hyperinflation.  No acute cardiopulmonary abnormality. 2.  Aortic Atherosclerosis (ICD10-I70.0). Electronically Signed   By: Odessa Fleming M.D.   On: 08/31/2020 06:33    ____________________________________________   PROCEDURES  Procedure(s) performed (including Critical Care):  Procedures  ____________________________________________  INITIAL IMPRESSION / ASSESSMENT AND PLAN / ED COURSE  As part of my medical decision making, I reviewed the following data within the electronic MEDICAL RECORD NUMBER History obtained from family, Nursing notes reviewed and incorporated, Labs reviewed , EKG interpreted , Old EKG reviewed, Old chart reviewed, Patient signed out to Dr. Roxan Hockey, Radiograph reviewed  and Notes from prior ED visits         Patient here with COPD exacerbation.  Differential also includes CHF, pneumonia, pneumothorax, ACS, PE.  Will give albuterol, Atrovent, Solu-Medrol.  Will obtain labs, chest x-ray.  EKG is nonischemic.  She does have some peripheral edema on exam but daughter reports this is  chronic, unchanged.  No known history of CHF.  She denies having any chest pain at this time.  ED PROGRESS  Patient continues to wheeze after 1 breathing treatment.  Will give second and reassess.  Chest x-ray shows no pneumothorax, edema or infiltrate.  Labs unremarkable other than minimally elevated troponin of 19.  She is not having any active chest pain.  Second pending.  BNP elevated at 691.  No known history of CHF.  No old for comparison.  Signed out to Dr. Roxan Hockey.  Patient will need to be reassessed after breathing treatments to determine disposition.  Patient and family updated with plan.   I reviewed all nursing notes and pertinent previous records as available.  I have reviewed and interpreted any EKGs, lab and urine results, imaging (as available).  ____________________________________________   FINAL CLINICAL IMPRESSION(S) / ED DIAGNOSES  Final diagnoses:  COPD exacerbation (HCC)     ED Discharge Orders         Ordered    albuterol (PROVENTIL) (2.5 MG/3ML) 0.083% nebulizer solution  Every 4 hours PRN        08/31/20 0708          *Please note:  SABINE TENENBAUM was evaluated in Emergency Department on 08/31/2020 for the symptoms described in the history of present illness. She was evaluated in the context of the global COVID-19 pandemic, which necessitated consideration that the patient might be at risk for infection with the SARS-CoV-2 virus that causes COVID-19. Institutional protocols and algorithms that pertain to the evaluation of patients at risk for COVID-19 are in a state of rapid change based on information released by regulatory bodies including the CDC and federal and state organizations. These policies and algorithms were followed during the patient's care in the ED.  Some ED evaluations and interventions may be delayed as a result of limited staffing during and the pandemic.*   Note:  This document was prepared using Dragon voice recognition software and may  include unintentional dictation errors.   Bryttani Blew, Layla Maw, DO 08/31/20 0721    Bobby Barton, Layla Maw, DO 08/31/20 [phone]

## 2020-08-31 NOTE — H&P (Signed)
History and Physical    Bridget [name] ERD:[id number] DOB: [birth year] DOA: 08/31/2020  PCP: Glori Bickers, MD   Patient coming from: Home  I have personally briefly reviewed patient's old medical records in [name]  Chief Complaint: Shortness of breath  HPI: Bridget [name] is a 87 y.o. female with medical history significant for COPD, hypertension and mild dementia who presents to the emergency room for evaluation of worsening shortness of breath from her baseline.  Patient states that she normally has trouble breathing but over the last couple of days it has been worse.  Her daughter and caregiver who was at the bedside states that she had to place her mother on supplemental oxygen 2 days ago when her pulse oximetry dropped to 87 -88% at rest.  With ambulation she states that her pulse oximetry was about 87%.  Patient is not on continuous home oxygen but appears to have a conserving device at home.  Shortness of breath is associated with 3 pillow orthopnea and mild lower extremity swelling.  She has a cough that is nonproductive.  Her daughter is concerned that she has puffiness over her eyelids which she states is unusual for the patient. She denies having any fever, no chills, no nausea, no vomiting, no abdominal pain, no dizziness, no lightheadedness, no headache, no blurred vision, no nocturia, no dysuria, no diarrhea, no constipation, no frequency, no palpitations, no diaphoresis. Labs show sodium 133, potassium 4.2, chloride 98, bicarb 27, glucose 144, BUN 24, creatinine 0.91, calcium 9.5, BNP 691, troponin 19 >> 22, white count 8.9, hemoglobin 15.9, hematocrit 48.6, MCV 94.9, RDW 14.1, platelet count 182 Chest x-ray reviewed by me shows hyperinflated lung fields Twelve-lead EKG reviewed by me shows atrial fibrillation with RVH.   ED Course: Patient is a 87 year old Caucasian female who presents to the emergency room for evaluation of worsening shortness of breath from her  baseline for about 3 days associated with 3 pillow orthopnea, lower extremity swelling and diffuse wheezing.  She has a history of COPD and is on home oxygen as needed.  Patient received bronchodilators in the ER as well as systemic steroids and will be referred to observation status for further evaluation.       Review of Systems: As per HPI otherwise all other systems reviewed and negative.    Past Medical History:  Diagnosis Date  . Asthma   . COPD (chronic obstructive pulmonary disease) (HCC)   . Hypertension     Past Surgical History:  Procedure Laterality Date  . APPENDECTOMY    . BREAST SURGERY    . HERNIA REPAIR       reports that she has quit smoking. She has never used smokeless tobacco. She reports that she does not drink alcohol and does not use drugs.  No Known Allergies  Family History  Problem Relation Age of Onset  . Immunocompromised Neg Hx       Prior to Admission medications   Medication Sig Start Date End Date Taking? Authorizing Provider  albuterol (PROVENTIL) (2.5 MG/3ML) 0.083% nebulizer solution Take 3 mLs (2.5 mg total) by nebulization every 4 (four) hours as needed for wheezing or shortness of breath. 08/31/20 08/31/21 Yes Ward, Kristen N, DO  ipratropium-albuterol (DUONEB) 0.5-2.5 (3) MG/3ML SOLN Take 3 mLs by nebulization 4 (four) times daily. 02/24/20   [provider]  lisinopril-hydrochlorothiazide (ZESTORETIC) 20-12.5 MG tablet Take 1 tablet by mouth daily. 06/15/20   [provider]    Physical Exam: Vitals:  08/31/20 0700 08/31/20 0730 08/31/20 0800 08/31/20 0900  BP: 131/87 (!) 144/89 (!) 144/73 (!) 149/83  Pulse: 62 76 75 76  Resp: 15 19 16  (!) 21  Temp:      TempSrc:      SpO2: 96% 100% 99% 94%  Weight:      Height:         Vitals:   08/31/20 0700 08/31/20 0730 08/31/20 0800 08/31/20 0900  BP: 131/87 (!) 144/89 (!) 144/73 (!) 149/83  Pulse: 62 76 75 76  Resp: 15 19 16  (!) 21  Temp:      TempSrc:       SpO2: 96% 100% 99% 94%  Weight:      Height:          Constitutional: Alert and oriented x 2 .  Has conversational dyspnea HEENT:      Head: Normocephalic and atraumatic.         Eyes: PERLA, EOMI, Conjunctivae are normal. Sclera is non-icteric.       Mouth/Throat: Mucous membranes are moist.       Neck: Supple with no signs of meningismus. Cardiovascula.r: Irregularly irregular. No murmurs, gallops, or rubs. 2+ symmetrical distal pulses are present . No JVD. 1+ LE edema Respiratory: Respiratory effort normal .diffuse expiratory wheezes.   Gastrointestinal: Soft, non tender, and non distended with positive bowel sounds.  Genitourinary: No CVA tenderness. Musculoskeletal: Nontender with normal range of motion in all extremities. No cyanosis, or erythema of extremities. Neurologic:  Face is symmetric. Moving all extremities. No gross focal neurologic deficits  Skin: Skin is warm, dry.  No rash or ulcers, xerosis of lower extremities Psychiatric: Mood and affect are normal    Labs on Admission: I have personally reviewed following labs and imaging studies  CBC: Recent Labs  Lab 08/31/20 0535  WBC 8.9  NEUTROABS 6.3  HGB 15.9*  HCT 48.6*  MCV 94.9  PLT 182   Basic Metabolic Panel: Recent Labs  Lab 08/31/20 0535  NA 133*  K 4.2  CL 98  CO2 27  GLUCOSE 144*  BUN 24*  CREATININE 0.91  CALCIUM 9.5   GFR: Estimated Creatinine Clearance: 34.1 mL/min (by C-G formula based on SCr of 0.91 mg/dL). Liver Function Tests: No results for input(s): AST, ALT, ALKPHOS, BILITOT, PROT, ALBUMIN in the last 168 hours. No results for input(s): LIPASE, AMYLASE in the last 168 hours. No results for input(s): AMMONIA in the last 168 hours. Coagulation Profile: No results for input(s): INR, PROTIME in the last 168 hours. Cardiac Enzymes: No results for input(s): CKTOTAL, CKMB, CKMBINDEX, TROPONINI in the last 168 hours. BNP (last 3 results) No results for input(s): PROBNP in the last  8760 hours. HbA1C: No results for input(s): HGBA1C in the last 72 hours. CBG: No results for input(s): GLUCAP in the last 168 hours. Lipid Profile: No results for input(s): CHOL, HDL, LDLCALC, TRIG, CHOLHDL, LDLDIRECT in the last 72 hours. Thyroid Function Tests: No results for input(s): TSH, T4TOTAL, FREET4, T3FREE, THYROIDAB in the last 72 hours. Anemia Panel: No results for input(s): VITAMINB12, FOLATE, FERRITIN, TIBC, IRON, RETICCTPCT in the last 72 hours. Urine analysis:    Component Value Date/Time   COLORURINE YELLOW 07/05/2020 1232   APPEARANCEUR CLOUDY (A) 07/05/2020 1232   LABSPEC 1.030 07/05/2020 1232   PHURINE 5.0 07/05/2020 1232   GLUCOSEU NEGATIVE 07/05/2020 1232   HGBUR SMALL (A) 07/05/2020 1232   BILIRUBINUR NEGATIVE 07/05/2020 1232   KETONESUR NEGATIVE 07/05/2020 1232   PROTEINUR >300 (  A) 07/05/2020 1232   NITRITE NEGATIVE 07/05/2020 1232   LEUKOCYTESUR SMALL (A) 07/05/2020 1232    Radiological Exams on Admission: DG Chest 2 View  Result Date: 08/31/2020 CLINICAL DATA:  87 year old female with shortness of breath since 20/1 100 hours. EXAM: CHEST - 2 VIEW COMPARISON:  Portable chest 07/05/2020. FINDINGS: Seated AP and lateral views of the chest. Improved left lung base ventilation since January, resolved indistinct opacity there. Increased AP dimension to the chest with exaggerated thoracic kyphosis. Mediastinal contours are stable and within normal limits. Calcified aortic atherosclerosis. No pneumothorax, pulmonary edema, pleural effusion or acute pulmonary opacity. Previous left humerus ORIF. Osteopenia. No acute osseous abnormality identified. Negative visible bowel gas. IMPRESSION: 1. Pulmonary hyperinflation.  No acute cardiopulmonary abnormality. 2.  Aortic Atherosclerosis (ICD10-I70.0). Electronically Signed   By: Odessa Fleming M.D.   On: 08/31/2020 06:33     Assessment/Plan Principal Problem:   COPD with acute exacerbation (HCC) Active Problems:   HTN  (hypertension)   Atrial fibrillation, chronic (HCC)   Acute CHF (congestive heart failure) (HCC)   CKD (chronic kidney disease), stage III (HCC)    COPD with acute exacerbation Patient has a history of COPD and presents to the ER for evaluation of worsening shortness of breath from her baseline associated with diffuse wheezing and a nonproductive cough. We will place patient on scheduled and as needed bronchodilator therapy Place patient on systemic steroids She will need to be assessed for home oxygen need prior to discharge    Chronic atrial fibrillation Patient is rate controlled Patient has a CHA2DS2-VASc score of 4 and ideally requires long-term anticoagulation as primary prophylaxis for an acute stroke She is not on anticoagulation due to high risk for falls    Hypertension with stage III chronic kidney disease Continue lisinopril We will hold hydrochlorothiazide for now    Probable acute diastolic dysfunction CHF Patient with complaints of worsening shortness of breath associated with orthopnea and pedal edema. We will start patient on Lasix 40 mg IV daily Hold hydrochlorothiazide Obtain 2D echocardiogram to assess LVEF   DVT prophylaxis: Lovenox Code Status: DNR resuscitate Family Communication: Greater than 50% of time was spent discussing patient's condition and plan of care with her daughter Dolores Hoose at the bedside.  All questions and concerns have been addressed.  They verbalized understanding and agree with the plan.  CODE STATUS was discussed the patient is a DNR Disposition Plan: Back to previous home environment Consults called: none Status: Observation    Cyruss Arata MD [facility]     08/31/2020, 9:46 AM

## 2020-08-31 NOTE — ED Notes (Signed)
Pt presenting to ED with reports of SOB starting around 4am this morning. Pt's daughter, who patient lives with, reports that patient also had an episode of SOB a few days ago. Pt denies CP, fever, cough, n/v.

## 2020-08-31 NOTE — Evaluation (Signed)
Occupational Therapy Evaluation Patient Details Name: Bridget [name] MRN: [id number] DOB: May 05, 1926 Today's Date: 08/31/2020    History of Present Illness 87 y.o. female with medical history significant for COPD, hypertension and mild dementia who presents to the emergency room for evaluation of worsening shortness of breath from her baseline.  Patient states that she normally has trouble breathing but over the last couple of days it has been worse.  Her daughter and caregiver who was at the bedside states that she had to place her mother on supplemental oxygen 2 days ago when her pulse oximetry dropped to 87 -88% at rest.  With ambulation she states that her pulse oximetry was about 87%.  Patient is not on continuous home oxygen but appears to have a conserving device at home.   Clinical Impression    Patient presenting with decreased I in self care, balance, functional mobility/transfer, endurance, and safety awareness.  Patient with decreased recall of new information this session. No family present to confirm baseline. Pt reports her husband dying ~ 1 week ago and living with daughter since. Pt reports use of SPC as needed for functional mobility/transfers  PTA. Pt required min hand held assistance for functional mobility, toilet transfer, and balance for safety. Patient will benefit from acute OT to increase overall independence in the areas of ADLs, functional mobility, and safety awareness in order to safely discharge home with family.   Follow Up Recommendations  Home health OT;Supervision/Assistance - 24 hour    Equipment Recommendations  None recommended by OT       Precautions / Restrictions Precautions Precautions: Fall      Mobility Bed Mobility Overal bed mobility: Needs Assistance Bed Mobility: Supine to Sit;Sit to Supine     Supine to sit: Min guard Sit to supine: Min guard   General bed mobility comments: min cuing for technique    Transfers Overall transfer  level: Needs assistance Equipment used: None Transfers: Sit to/from [employer] Sit to Stand: Min assist Stand pivot transfers: Min assist       General transfer comment: min cuing for safety awareness and technique    Balance Overall balance assessment: Needs assistance Sitting-balance support: Feet supported Sitting balance-Leahy Scale: Good     Standing balance support: During functional activity Standing balance-Leahy Scale: Fair                             ADL either performed or assessed with clinical judgement   ADL Overall ADL's : Needs assistance/impaired     Grooming: Wash/dry hands;Wash/dry face;Sitting;Set up;Supervision/safety           Upper Body Dressing : Minimal assistance Upper Body Dressing Details (indicate cue type and reason): hospital gown Lower Body Dressing: Minimal assistance Lower Body Dressing Details (indicate cue type and reason): doffing Toilet Transfer: Minimal assistance;Ambulation Toilet Transfer Details (indicate cue type and reason): HHA for balance Toileting- Clothing Manipulation and Hygiene: Minimal assistance       Functional mobility during ADLs: Minimal assistance;Cueing for safety;Cueing for sequencing       Vision Baseline Vision/History: No visual deficits Patient Visual Report: No change from baseline              Pertinent Vitals/Pain Pain Assessment: No/denies pain     Hand Dominance Right   Extremity/Trunk Assessment Upper Extremity Assessment Upper Extremity Assessment: Generalized weakness   Lower Extremity Assessment Lower Extremity Assessment: Generalized weakness  Communication Communication Communication: No difficulties   Cognition Arousal/Alertness: Awake/alert Behavior During Therapy: WFL for tasks assessed/performed Overall Cognitive Status: No family/caregiver present to determine baseline cognitive functioning                                  General Comments: Pt very pleasant but unable to recall new information. Pt reports it is "May 2012". OT discussed date and pt reports feeling confused. OT asked pt again 5 minutes later and she again reports "May 2012".              Home Living Family/patient expects to be discharged to:: Private residence Living Arrangements: Children Available Help at Discharge: Family;Available 24 hours/day Type of Home: House Home Access: Stairs to enter [employer] of Steps: 2-3 steps Entrance Stairs-Rails: Right;Left Home Layout: One level     Bathroom Shower/Tub: Tub/shower unit         Home Equipment: Environmental consultant - 2 wheels;Cane - single point;Shower seat;Bedside commode   Additional Comments: Pt does appear to have some memory deficits. She reports her husband died ~ 1 week ago and has been living with daughter since. She reports her daughter works from home and is there all the time with her.      Prior Functioning/Environment Level of Independence: Independent with assistive device(s)  Gait / Transfers Assistance Needed: Pt reports she uses SPC at baseline for ambulation. ADL's / Homemaking Assistance Needed: Pt reports performing all self care tasks with use of equipment/modifications as needed            OT Problem List: Decreased strength;Decreased activity tolerance;Impaired balance (sitting and/or standing);Decreased coordination;Pain;Decreased safety awareness;Decreased knowledge of use of DME or AE;Cardiopulmonary status limiting activity;Decreased cognition      OT Treatment/Interventions: Self-care/ADL training;Manual therapy;Therapeutic exercise;Neuromuscular education;Energy conservation;DME and/or AE instruction;Cognitive remediation/compensation;Therapeutic activities;Balance training;Patient/family education    OT Goals(Current goals can be found in the care plan section) Acute Rehab OT Goals Patient Stated Goal: to feel better and get stronger OT Goal  Formulation: With patient Time For Goal Achievement: 09/14/20 Potential to Achieve Goals: Good ADL Goals Pt Will Perform Grooming: with modified independence;standing Pt Will Perform Lower Body Dressing: with modified independence;sit to/from stand Pt Will Transfer to Toilet: with modified independence;ambulating Pt Will Perform Toileting - Clothing Manipulation and hygiene: with modified independence;sit to/from stand  OT Frequency: Min 2X/week              AM-PAC OT "6 Clicks" Daily Activity     Outcome Measure Help from another person eating meals?: None Help from another person taking care of personal grooming?: A Little Help from another person toileting, which includes using toliet, bedpan, or urinal?: A Little Help from another person bathing (including washing, rinsing, drying)?: A Little Help from another person to put on and taking off regular upper body clothing?: None Help from another person to put on and taking off regular lower body clothing?: A Little 6 Click Score: 20   End of Session Equipment Utilized During Treatment: Rolling walker Nurse Communication: Mobility status  Activity Tolerance: Patient tolerated treatment well Patient left: in bed;with call bell/phone within reach;with nursing/sitter in room  OT Visit Diagnosis: Unsteadiness on feet (R26.81);Repeated falls (R29.6);Muscle weakness (generalized) (M62.81);History of falling (Z91.81)                Time: 8341-9622 OT Time Calculation (min): 19 min Charges:  OT General Charges $OT Visit: 1  Visit OT Evaluation $OT Eval Low Complexity: 1 Low OT Treatments $Self Care/Home Management : 8-22 mins  Jackquline Denmark, MS, OTR/L , CBIS ascom [phone]  08/31/20, 2:06 PM

## 2020-08-31 NOTE — Progress Notes (Signed)
*  PRELIMINARY RESULTS* Echocardiogram 2D Echocardiogram has been performed.  Bridget [name] 08/31/2020, 6:50 PM

## 2020-08-31 NOTE — ED Triage Notes (Signed)
Reports sudden onset of SOB last night 9PM, has used her PRN nebs and supplemental oxygen without relief. Pt able to speak in short few word sentences with increased WOB with talking or movement noted at time of triage assessment. In no RR distress at this time.

## 2020-08-31 NOTE — ED Notes (Signed)
Dr. Roxan Hockey in to reassess pt at this time.  Duo neb held at this time, pt placed on RA will monitor.

## 2020-08-31 NOTE — ED Provider Notes (Signed)
Patient reassessed and still having significant wheezing.  She is feeling improved.  Based on her age risk factors do feel that she would benefit from hospitalization for additional resp tx and monitoring.  States she is a DNR.  Conversation witnessed by patient's daughter at bedside.     Willy Eddy, MD 08/31/20 [phone]

## 2020-08-31 NOTE — Plan of Care (Signed)

## 2020-08-31 NOTE — Progress Notes (Signed)
Met patient who was sitting alone in bed. She immediatelt stated after I asking " how are You?" she was lonely. She asked if that was a terrible thing to say. I assured her it was not. She shared abouth her 5 children and 14 grandchildren, of who she is so proud. She asked me of my family. A very pleasant visit, I promised to return to visit her tomorrow.

## 2020-08-31 NOTE — ED Notes (Signed)
Pt to XR at this time

## 2020-08-31 NOTE — Progress Notes (Signed)
Initial Nutrition Assessment  DOCUMENTATION CODES:   Not applicable  INTERVENTION:   Ensure Enlive po BID, each supplement provides 350 kcal and 20 grams of protein  MVI po daily   NUTRITION DIAGNOSIS:   Increased nutrient needs related to catabolic illness (COPD) as evidenced by estimated needs.  GOAL:   Patient will meet greater than or equal to 90% of their needs  MONITOR:   PO intake,Supplement acceptance,Labs,Weight trends,Skin,I & O's  REASON FOR ASSESSMENT:   Consult COPD Protocol  ASSESSMENT:   87 y.o. female with history of hypertension, asthma, recent COVID 19 in January, mild dementia, Afib, CKD III and COPD not on home oxygen who presents to the emergency department with complaints of intermittent shortness of breath.  RD working remotely.  Unable to reach pt by phone. Suspect pt with decreased oral intake pta r/t SOB. There are no documented intakes in chart. RD will add supplements and MVI to help pt meet her estimated needs. There is also no documented weight history in chart to determine if any significant recent weight changes. RD will obtain nutrition related history and exam at follow-up.   Medications reviewed and include: lovenox, lasix, solu-medrol  Labs reviewed: Na 133(L), BUN 24(H)  NUTRITION - FOCUSED PHYSICAL EXAM: Unable to perform at this time   Diet Order:   Diet Order            Diet 2 gram sodium Room service appropriate? Yes; Fluid consistency: Thin  Diet effective now                EDUCATION NEEDS:   No education needs have been identified at this time  Skin:  Skin Assessment: Reviewed RN Assessment  Last BM:  pta  Height:   Ht Readings from Last 1 Encounters:  08/31/20 5\' 4"  (1.626 m)    Weight:   Wt Readings from Last 1 Encounters:  08/31/20 56.7 kg    Ideal Body Weight:  54.5 kg  BMI:  Body mass index is 21.46 kg/m.  Estimated Nutritional Needs:   Kcal:  1400-1600kcal/day  Protein:   70-80g/day  Fluid:  1.4-1.6L/day  09/02/20 MS, RD, LDN Please refer to [facility] for RD and/or RD on-call/weekend/after hours pager

## 2020-08-31 NOTE — Evaluation (Signed)
Physical Therapy Evaluation Patient Details Name: Bridget [name] MRN: [id number] DOB: 1925-11-29 Today's Date: 08/31/2020   History of Present Illness  Pt is a 87 y.o. female presenting to hospital 3/21 with intermittent SOB for past couple of days; worsens with exertion.  Pt also noted with 3 pillow orthopnea, LE swelling, and diffuse wheezing.  Pt admitted with COPD with acute exacerbation and probable acute diastolic dysfunction CHF.  PMH includes htn, asthma, COPD (no home O2).  PMH includes (+) COVID 19 07/05/20, asthma, COPD, htn, acute lower UTI, breast surgery, hernia repair, mild dementia.  Clinical Impression  Prior to hospital admission, pt was modified independent ambulating with SPC; recently living with her daughter (d/t pt's husband passing away about 1 week ago) in a 1 level home with 2-3 STE with railing.  Mild cognitive deficits noted during session.  Currently pt is SBA semi-supine to sitting edge of bed; CGA with transfers (hand hold assist); and CGA ambulating 5 feet (with hand hold assist).  Pt incontinent of urine 1st two times standing but able to transfer to [facility] to finish toileting on 3rd trial standing (therapist assisted pt with clean-up).  Limited distance ambulating d/t SOB (O2 sats 92% or greater on room air and HR 94-107 bpm during sessions activities).  Pt would benefit from skilled PT to address noted impairments and functional limitations (see below for any additional details).  Upon hospital discharge, pt would benefit from HHPT.    Follow Up Recommendations Home health PT    Equipment Recommendations  Other (comment) (pt has RW and SPC at home already)    Recommendations for Other Services OT consult     Precautions / Restrictions Precautions Precautions: Fall Restrictions Weight Bearing Restrictions: No      Mobility  Bed Mobility Overal bed mobility: Needs Assistance Bed Mobility: Supine to Sit     Supine to sit: Supervision;HOB elevated    General bed mobility comments: mild increased effort to perform on own    Transfers Overall transfer level: Needs assistance Equipment used: 1 person hand held assist Transfers: Sit to/from Stand Sit to Stand: Min guard Stand pivot transfers: Min guard (stand step turn bed to [facility])       General transfer comment: fairly strong stand with single UE hand hold assist for support (x3 trials from bed and x1 trial from [facility])  Ambulation/Gait Ambulation/Gait assistance: Min guard Gait Distance (Feet): 5 Feet Assistive device: 1 person hand held assist   Gait velocity: decreased   General Gait Details: decreased B LE step length/foot clearance/heelstrike; limited distance d/t SOB  Stairs            Wheelchair Mobility    Modified Rankin (Stroke Patients Only)       Balance Overall balance assessment: Needs assistance Sitting-balance support: No upper extremity supported;Feet supported Sitting balance-Leahy Scale: Normal Sitting balance - Comments: steady sitting reaching outside BOS   Standing balance support: Single extremity supported Standing balance-Leahy Scale: Fair Standing balance comment: pt requiring at least single UE support for standing activities                             Pertinent Vitals/Pain Pain Assessment: No/denies pain    Home Living Family/patient expects to be discharged to:: Private residence Living Arrangements: Children (Currently staying with pt's daughter (d/t husband passing away about 1 week ago)) Available Help at Discharge: Family;Available 24 hours/day Type of Home: House Home Access: Stairs to  enter Entrance Stairs-Rails: Left Entrance Stairs-Number of Steps: 2-3 steps Home Layout: One level Home Equipment: Walker - 2 wheels;Cane - single point;Shower seat;Bedside commode Additional Comments: Per OT eval, pt reports her daughter works from home and is there all the time.  Mild memory deficits noted.    Prior Function  Level of Independence: Independent with assistive device(s)   Gait / Transfers Assistance Needed: Ambulatory with SPC.  Pt reports no recent falls.  ADL's / Homemaking Assistance Needed: Per OT eval "Pt reports performing all self care tasks with use of equipment/modifications as needed"        Hand Dominance   Dominant Hand: Right    Extremity/Trunk Assessment   Upper Extremity Assessment Upper Extremity Assessment: Defer to OT evaluation;Generalized weakness    Lower Extremity Assessment Lower Extremity Assessment: Generalized weakness    Cervical / Trunk Assessment Cervical / Trunk Assessment: Other exceptions Cervical / Trunk Exceptions: forward head/shoulders  Communication   Communication: No difficulties  Cognition Arousal/Alertness: Awake/alert Behavior During Therapy: WFL for tasks assessed/performed Overall Cognitive Status: No family/caregiver present to determine baseline cognitive functioning                                 General Comments: Oriented to person, place, and general situation.      General Comments   Nursing cleared pt for participation in physical therapy.  Pt agreeable to PT session.    Exercises     Assessment/Plan    PT Assessment Patient needs continued PT services  PT Problem List Decreased strength;Decreased activity tolerance;Decreased balance;Decreased mobility;Decreased knowledge of use of DME;Decreased knowledge of precautions       PT Treatment Interventions DME instruction;Gait training;Stair training;Functional mobility training;Therapeutic activities;Therapeutic exercise;Balance training;Patient/family education    PT Goals (Current goals can be found in the Care Plan section)  Acute Rehab PT Goals Patient Stated Goal: to improve breathing and mobility PT Goal Formulation: With patient Time For Goal Achievement: 09/14/20 Potential to Achieve Goals: Good    Frequency Min 2X/week   Barriers to discharge         Co-evaluation               AM-PAC PT "6 Clicks" Mobility  Outcome Measure Help needed turning from your back to your side while in a flat bed without using bedrails?: None Help needed moving from lying on your back to sitting on the side of a flat bed without using bedrails?: A Little Help needed moving to and from a bed to a chair (including a wheelchair)?: A Little Help needed standing up from a chair using your arms (e.g., wheelchair or bedside chair)?: A Little Help needed to walk in hospital room?: A Little Help needed climbing 3-5 steps with a railing? : A Little 6 Click Score: 19    End of Session Equipment Utilized During Treatment: Gait belt Activity Tolerance: Patient tolerated treatment well Patient left: in chair;with call bell/phone within reach;with chair alarm set;with nursing/sitter in room Nurse Communication: Mobility status;Precautions (pt incontinent of urine during session) PT Visit Diagnosis: Other abnormalities of gait and mobility (R26.89);Unsteadiness on feet (R26.81);Muscle weakness (generalized) (M62.81)    Time: 5809-9833 PT Time Calculation (min) (ACUTE ONLY): 27 min   Charges:   PT Evaluation $PT Eval Low Complexity: 1 Low PT Treatments $Therapeutic Activity: 8-22 mins       Hendricks Limes, PT 08/31/20, 4:37 PM

## 2020-09-01 ENCOUNTER — Encounter: Payer: Self-pay | Admitting: Internal Medicine

## 2020-09-01 DIAGNOSIS — I482 Chronic atrial fibrillation, unspecified: Secondary | ICD-10-CM | POA: Diagnosis not present

## 2020-09-01 DIAGNOSIS — J441 Chronic obstructive pulmonary disease with (acute) exacerbation: Secondary | ICD-10-CM | POA: Diagnosis not present

## 2020-09-01 DIAGNOSIS — I5031 Acute diastolic (congestive) heart failure: Secondary | ICD-10-CM | POA: Diagnosis not present

## 2020-09-01 LAB — CBC
HCT: 48.4 % — ABNORMAL HIGH (ref 36.0–46.0)
Hemoglobin: 15.7 g/dL — ABNORMAL HIGH (ref 12.0–15.0)
MCH: 30.7 pg (ref 26.0–34.0)
MCHC: 32.4 g/dL (ref 30.0–36.0)
MCV: 94.5 fL (ref 80.0–100.0)
Platelets: 190 10*3/uL (ref 150–400)
RBC: 5.12 MIL/uL — ABNORMAL HIGH (ref 3.87–5.11)
RDW: 14.3 % (ref 11.5–15.5)
WBC: 14.5 10*3/uL — ABNORMAL HIGH (ref 4.0–10.5)
nRBC: 0 % (ref 0.0–0.2)

## 2020-09-01 LAB — ECHOCARDIOGRAM COMPLETE
AR max vel: 1.56 cm2
AV Peak grad: 7.7 mmHg
Ao pk vel: 1.39 m/s
Area-P 1/2: 3.74 cm2
Height: 64 in
S' Lateral: 2.79 cm
Weight: 2000 oz

## 2020-09-01 LAB — BASIC METABOLIC PANEL
Anion gap: 11 (ref 5–15)
BUN: 25 mg/dL — ABNORMAL HIGH (ref 8–23)
CO2: 26 mmol/L (ref 22–32)
Calcium: 9.3 mg/dL (ref 8.9–10.3)
Chloride: 101 mmol/L (ref 98–111)
Creatinine, Ser: 0.93 mg/dL (ref 0.44–1.00)
GFR, Estimated: 58 mL/min — ABNORMAL LOW (ref >60)
Glucose, Bld: 117 mg/dL — ABNORMAL HIGH (ref 70–99)
Potassium: 4.5 mmol/L (ref 3.5–5.1)
Sodium: 138 mmol/L (ref 135–145)

## 2020-09-01 LAB — HIV ANTIBODY (ROUTINE TESTING W REFLEX): HIV Screen 4th Generation wRfx: NONREACTIVE

## 2020-09-01 NOTE — Plan of Care (Signed)
Updated pt's daughter, Dolores Hoose.  Wants update from dr relating to ECHO and EKG and if mother has CHF. She expressed concern that her mother not be d/ced too soon b/c her mother will insist on leaving.  Pt very pleasantly confused.

## 2020-09-01 NOTE — TOC Initial Note (Signed)
Transition of Care [facility]) - Initial/Assessment Note    Patient Details  Name: Bridget [name] MRN: [id number] Date of Birth: February 14, 1926  Transition of Care [facility]) CM/SW Contact:    Shelbie Hutching, RN Phone Number: 09/01/2020, 2:35 PM  Clinical Narrative:                 Patient placed under observation for COPD exacerbation.  RNCM met with patient in the room this morning and then spoke with patient's daughter, Lattie Haw, via phone.  Patient lives with Lattie Haw and has been for the past year.  Patient has dementia.  PT is recommending home health and Lattie Haw reports that they would love to have home health services again and they really liked [facility].  RNCM reached out to [facility] to see if they can accept for RN, PT, OT and aide.  Patient has a walker at home.  Daughter provides all transportation.  Patient is current with her PCP but he is in [place] where patient was living previously.  Daughter will be looking for a local PCP in the near future.   Potential for discharge tomorrow.   Expected Discharge Plan: [place] Barriers to Discharge: Continued Medical Work up   Patient Goals and CMS Choice Patient states their goals for this hospitalization and ongoing recovery are:: Daughter would like for patient to return home with home health services. CMS Medicare.gov Compare Post Acute Care list provided to:: Patient Represenative (must comment) Choice offered to / list presented to : Adult Children  Expected Discharge Plan and Services Expected Discharge Plan: [place]   Discharge Planning Services: CM Consult Post Acute Care Choice: Home Health Living arrangements for the past 2 months: Single Family Home                 DME Arranged: N/A DME Agency: NA       HH Arranged: RN,PT,OT,Nurse's Aide HH Agency: [place] Date [place]: 09/01/20 Time HH Agency Contacted: 62 Representative spoke with at [place]: Tommi Rumps  Prior Living  Arrangements/Services Living arrangements for the past 2 months: [place] Lives with:: Adult Children Patient language and need for interpreter reviewed:: Yes Do you feel safe going back to the place where you live?: Yes      Need for Family Participation in Patient Care: Yes (Comment) (Dementia) Care giver support system in place?: Yes (comment) (daughter and son in law) Current home services: DME (walker, cane) Criminal Activity/Legal Involvement Pertinent to Current Situation/Hospitalization: No - Comment as needed  Activities of Daily Living Home Assistive Devices/Equipment: Grab bars in shower,Grab bars around toilet ADL Screening (condition at time of admission) Patient's cognitive ability adequate to safely complete daily activities?: Yes Is the patient deaf or have difficulty hearing?: No Does the patient have difficulty seeing, even when wearing glasses/contacts?: No Does the patient have difficulty concentrating, remembering, or making decisions?: Yes Patient able to express need for assistance with ADLs?: Yes Does the patient have difficulty dressing or bathing?: No Independently performs ADLs?: Yes (appropriate for developmental age) Does the patient have difficulty walking or climbing stairs?: Yes Weakness of Legs: None Weakness of Arms/Hands: None  Permission Sought/Granted Permission sought to share information with : Case Manager,Family Supports,Other (comment) Permission granted to share information with : Yes, Verbal Permission Granted  Share Information with NAME: Lattie Haw  Permission granted to share info w AGENCY: [facility]  Permission granted to share info w Relationship: daughter  Emotional Assessment Appearance:: Appears stated age Attitude/Demeanor/Rapport: Engaged Affect (typically observed): Accepting Orientation: : Oriented to Self,Oriented to Place,Oriented to  Time Alcohol / Substance Use: Not Applicable Psych Involvement: No  (comment)  Admission diagnosis:  COPD exacerbation ([place]) [J44.1] COPD with acute exacerbation ([place]) [J44.1] Patient Active Problem List   Diagnosis Date Noted  . COPD with acute exacerbation ([place]) 08/31/2020  . Atrial fibrillation, chronic ([place]) 08/31/2020  . Acute CHF (congestive heart failure) ([place]) 08/31/2020  . CKD (chronic kidney disease), stage III ([place]) 08/31/2020  . HTN (hypertension) 07/06/2020  . Acute lower UTI 07/06/2020  . Acute respiratory disease due to COVID-19 virus 07/05/2020   PCP:  Keane Police, MD Pharmacy:   [facility] [address], [place] - [place] [address] [place] [address] Phone: [phone] Fax: [phone]  [facility] [place], [place] [facility] [place] [address] [place] [address] Phone: [phone] Fax: [phone]  [id number] [place]: [phone]: [phone]    Social Determinants of Health (SDOH) Interventions    Readmission Risk Interventions No flowsheet data found.

## 2020-09-01 NOTE — Care Management Obs Status (Signed)
MEDICARE OBSERVATION STATUS NOTIFICATION   Patient Details  Name: Bridget [name] MRN: [id number] Date of Birth: 20-Oct-1925   Medicare Observation Status Notification Given:  Yes    Allayne Butcher, RN 09/01/2020, 11:18 AM

## 2020-09-01 NOTE — Progress Notes (Addendum)
Progress Note    Bridget [name]  SPQ:[id number] DOB: 02/18/1926  DOA: 08/31/2020 PCP: Glori Bickers, MD      Brief Narrative:    Medical records reviewed and are as summarized below:  Bridget [name] is a 87 y.o. female with medical history significant for COPD, hypertension, mild dementia, who presented to the hospital because of worsening shortness of breath, nonproductive cough, 3 pillow orthopnea, facial puffiness and bilateral leg swelling.    She was admitted to the hospital for acute hypoxic respiratory failure, acute exacerbation of chronic diastolic CHF and COPD exacerbation.  She was treated with IV Lasix, steroids and bronchodilators.      Assessment/Plan:   Principal Problem:   COPD with acute exacerbation (HCC) Active Problems:   HTN (hypertension)   Atrial fibrillation, chronic (HCC)   Acute CHF (congestive heart failure) (HCC)   CKD (chronic kidney disease), stage III (HCC)   Nutrition Problem: Increased nutrient needs Etiology: catabolic illness (COPD)  Signs/Symptoms: estimated needs   Body mass index is 21.46 kg/m.    Acute exacerbation of chronic diastolic CHF: Continue IV Lasix.  Monitor BMP, daily weight and urine output.  2D echo showed EF estimated at 60 to 65%, grade 1 diastolic dysfunction and mild mitral regurgitation.  COPD exacerbation: Continue prednisone and bronchodilators  Acute hypoxic respiratory failure: Improved and she is tolerating room air.  Assess need for home oxygen prior to discharge.  Unsteady gait: PT and OT evaluation.  Chronic atrial fibrillation: CHA2DS2-VASc score is 4.  She is not on anticoagulation because of high risk for falls.  CKD stage IIIa: Creatinine is stable  Leukocytosis: This is probably due to steroids.  Other comorbidities include hypertension, mild dementia.  I called her daughter, [name], at [phone], but there was no response.  I left a voice message for her to call back.  Diet  Order            Diet 2 gram sodium Room service appropriate? Yes; Fluid consistency: Thin  Diet effective now                      Medications:   . AeroChamber Plus Flo-Vu Medium  1 each Other Once  . enoxaparin (LOVENOX) injection  40 mg Subcutaneous Q24H  . feeding supplement  237 mL Oral BID BM  . furosemide  40 mg Intravenous Daily  . ipratropium-albuterol  3 mL Nebulization Once  . lisinopril  20 mg Oral Daily  . multivitamin with minerals  1 tablet Oral Daily  . predniSONE  40 mg Oral Q breakfast  . sodium chloride flush  3 mL Intravenous Q12H   Continuous Infusions: . sodium chloride       Anti-infectives (From admission, onward)   None             Family Communication/Anticipated D/C date and plan/Code Status   DVT prophylaxis: enoxaparin (LOVENOX) injection 40 mg Start: 08/31/20 1000     Code Status: DNR  Family Communication: None Disposition Plan:    Status is: Observation  The patient will require care spanning > 2 midnights and should be moved to inpatient because: Unsafe d/c plan, IV treatments appropriate due to intensity of illness or inability to take PO and Inpatient level of care appropriate due to severity of illness  Dispo: The patient is from: Home              Anticipated d/c is to: Home  Patient currently is not medically stable to d/c.   Difficult to place patient No           Subjective:   Interval events noted.  C/o shortness of breath.  She was very unsteady on her feet when she ambulated with the nurse to the bathroom.  She was also very short of breath with ambulation.  Objective:    Vitals:   09/01/20 0536 09/01/20 0745 09/01/20 0746 09/01/20 1132  BP: (!) 146/84 122/77 122/71 (!) 113/59  Pulse: 72 63 79 82  Resp: 20  18 18   Temp: 97.8 F (36.6 C) 97.8 F (36.6 C) 97.8 F (36.6 C) 98 F (36.7 C)  TempSrc:      SpO2: 93% 93% 93% 95%  Weight:      Height:       No data  found.   Intake/Output Summary (Last 24 hours) at 09/01/2020 1441 Last data filed at 09/01/2020 1400 Gross per 24 hour  Intake 240 ml  Output 3200 ml  Net -2960 ml   Filed Weights   08/31/20 0533  Weight: 56.7 kg    Exam:  GEN: NAD SKIN: Warm and dry.  Chronic skin changes including erythema on bilateral legs suggestive of chronic venous insufficiency. EYES: EOMI ENT: MMM CV: RRR PULM: Decreased air entry bilaterally, bilateral expiratory wheezing.  No rales heard. ABD: soft, ND, NT, +BS CNS: AAO x 3, non focal EXT: No edema or tenderness     Data Reviewed:   I have personally reviewed following labs and imaging studies:  Labs: Labs show the following:   Basic Metabolic Panel: Recent Labs  Lab 08/31/20 0535 09/01/20 0517  NA 133* 138  K 4.2 4.5  CL 98 101  CO2 27 26  GLUCOSE 144* 117*  BUN 24* 25*  CREATININE 0.91 0.93  CALCIUM 9.5 9.3   GFR Estimated Creatinine Clearance: 33.3 mL/min (by C-G formula based on SCr of 0.93 mg/dL). Liver Function Tests: No results for input(s): AST, ALT, ALKPHOS, BILITOT, PROT, ALBUMIN in the last 168 hours. No results for input(s): LIPASE, AMYLASE in the last 168 hours. No results for input(s): AMMONIA in the last 168 hours. Coagulation profile No results for input(s): INR, PROTIME in the last 168 hours.  CBC: Recent Labs  Lab 08/31/20 0535 09/01/20 0517  WBC 8.9 14.5*  NEUTROABS 6.3  --   HGB 15.9* 15.7*  HCT 48.6* 48.4*  MCV 94.9 94.5  PLT 182 190   Cardiac Enzymes: No results for input(s): CKTOTAL, CKMB, CKMBINDEX, TROPONINI in the last 168 hours. BNP (last 3 results) No results for input(s): PROBNP in the last 8760 hours. CBG: No results for input(s): GLUCAP in the last 168 hours. D-Dimer: No results for input(s): DDIMER in the last 72 hours. Hgb A1c: No results for input(s): HGBA1C in the last 72 hours. Lipid Profile: No results for input(s): CHOL, HDL, LDLCALC, TRIG, CHOLHDL, LDLDIRECT in the last  72 hours. Thyroid function studies: No results for input(s): TSH, T4TOTAL, T3FREE, THYROIDAB in the last 72 hours.  Invalid input(s): FREET3 Anemia work up: No results for input(s): VITAMINB12, FOLATE, FERRITIN, TIBC, IRON, RETICCTPCT in the last 72 hours. Sepsis Labs: Recent Labs  Lab 08/31/20 0535 09/01/20 0517  WBC 8.9 14.5*    Microbiology Recent Results (from the past 240 hour(s))  SARS CORONAVIRUS 2 (TAT 6-24 HRS) Nasopharyngeal Nasopharyngeal Swab     Status: None   Collection Time: 08/31/20 10:34 AM   Specimen: Nasopharyngeal Swab  Result Value  Ref Range Status   SARS Coronavirus 2 NEGATIVE NEGATIVE Final    Comment: (NOTE) SARS-CoV-2 target nucleic acids are NOT DETECTED.  The SARS-CoV-2 RNA is generally detectable in upper and lower respiratory specimens during the acute phase of infection. Negative results do not preclude SARS-CoV-2 infection, do not rule out co-infections with other pathogens, and should not be used as the sole basis for treatment or other patient management decisions. Negative results must be combined with clinical observations, patient history, and epidemiological information. The expected result is Negative.  Fact Sheet for Patients: HairSlick.no  Fact Sheet for Healthcare Providers: quierodirigir.com  This test is not yet approved or cleared by the [place] FDA and  has been authorized for detection and/or diagnosis of SARS-CoV-2 by FDA under an Emergency Use Authorization (EUA). This EUA will remain  in effect (meaning this test can be used) for the duration of the COVID-19 declaration under Se ction 564(b)(1) of the Act, 21 U.S.C. section 360bbb-3(b)(1), unless the authorization is terminated or revoked sooner.  Performed at [facility], [address], [place], [place] [address]     Procedures and diagnostic studies:  DG Chest 2 View  Result Date:  08/31/2020 CLINICAL DATA:  87 year old female with shortness of breath since 20/1 100 hours. EXAM: CHEST - 2 VIEW COMPARISON:  Portable chest 07/05/2020. FINDINGS: Seated AP and lateral views of the chest. Improved left lung base ventilation since January, resolved indistinct opacity there. Increased AP dimension to the chest with exaggerated thoracic kyphosis. Mediastinal contours are stable and within normal limits. Calcified aortic atherosclerosis. No pneumothorax, pulmonary edema, pleural effusion or acute pulmonary opacity. Previous left humerus ORIF. Osteopenia. No acute osseous abnormality identified. Negative visible bowel gas. IMPRESSION: 1. Pulmonary hyperinflation.  No acute cardiopulmonary abnormality. 2.  Aortic Atherosclerosis (ICD10-I70.0). Electronically Signed   By: Odessa Fleming M.D.   On: 08/31/2020 06:33   ECHOCARDIOGRAM COMPLETE  Result Date: 09/01/2020    ECHOCARDIOGRAM REPORT   Patient Name:   EYLEEN RAWLINSON Date of Exam: 08/31/2020 Medical Rec #:  [id number]      Height:       64.0 in Accession #:    [id number]     Weight:       125.0 lb Date of Birth:  09/09/1925      BSA:          1.602 m Patient Age:    89 years       BP:           118/67 mmHg Patient Gender: F              HR:           96 bpm. Exam Location:  [facility] Procedure: 2D Echo, Cardiac Doppler and Color Doppler Indications:     CHF-Acute Diastolic I50.31  History:         Patient has no prior history of Echocardiogram examinations.                  Risk Factors:Hypertension.  Sonographer:     Neysa Bonito Roar Referring Phys:  [id number] VHQIONGE AGBATA Diagnosing Phys: Harold Hedge MD IMPRESSIONS  1. Left ventricular ejection fraction, by estimation, is 60 to 65%. The left ventricle has normal function. The left ventricle has no regional wall motion abnormalities. There is mild left ventricular hypertrophy. Left ventricular diastolic parameters are consistent with Grade I diastolic dysfunction (impaired relaxation).  2. Right ventricular  systolic function is normal. The right ventricular  size is normal.  3. Right atrial size was mildly dilated.  4. The mitral valve is grossly normal. Mild mitral valve regurgitation.  5. The aortic valve was not well visualized. Aortic valve regurgitation is mild. FINDINGS  Left Ventricle: Left ventricular ejection fraction, by estimation, is 60 to 65%. The left ventricle has normal function. The left ventricle has no regional wall motion abnormalities. The left ventricular internal cavity size was normal in size. There is  mild left ventricular hypertrophy. Left ventricular diastolic parameters are consistent with Grade I diastolic dysfunction (impaired relaxation). Right Ventricle: The right ventricular size is normal. No increase in right ventricular wall thickness. Right ventricular systolic function is normal. Left Atrium: Left atrial size was normal in size. Right Atrium: Right atrial size was mildly dilated. Pericardium: There is no evidence of pericardial effusion. Mitral Valve: The mitral valve is grossly normal. Mild mitral valve regurgitation. Tricuspid Valve: The tricuspid valve is grossly normal. Tricuspid valve regurgitation is mild. Aortic Valve: The aortic valve was not well visualized. Aortic valve regurgitation is mild. Aortic valve peak gradient measures 7.7 mmHg. Pulmonic Valve: The pulmonic valve was not well visualized. Pulmonic valve regurgitation is trivial. Aorta: The aortic root was not well visualized. IAS/Shunts: The interatrial septum was not well visualized.  LEFT VENTRICLE PLAX 2D LVIDd:         4.05 cm  Diastology LVIDs:         2.79 cm  LV e' medial:    8.38 cm/s LV PW:         0.91 cm  LV E/e' medial:  14.0 LV IVS:        1.10 cm  LV e' lateral:   13.80 cm/s LVOT diam:     1.80 cm  LV E/e' lateral: 8.5 LVOT Area:     2.54 cm  RIGHT VENTRICLE RV Mid diam:    2.70 cm RV S prime:     13.80 cm/s TAPSE (M-mode): 1.6 cm LEFT ATRIUM             Index       RIGHT ATRIUM           Index LA  diam:        4.00 cm 2.50 cm/m  RA Area:     19.10 cm LA Vol (A2C):   66.8 ml 41.69 ml/m RA Volume:   46.60 ml  29.09 ml/m LA Vol (A4C):   59.3 ml 37.01 ml/m LA Biplane Vol: 63.4 ml 39.57 ml/m  AORTIC VALVE                PULMONIC VALVE AV Area (Vmax): 1.56 cm    PV Vmax:        0.91 m/s AV Vmax:        139.00 cm/s PV Peak grad:   3.3 mmHg AV Peak Grad:   7.7 mmHg    RVOT Peak grad: 1 mmHg LVOT Vmax:      85.10 cm/s  AORTA Ao Root diam: 3.00 cm MITRAL VALVE                TRICUSPID VALVE MV Area (PHT): 3.74 cm     TR Peak grad:   39.9 mmHg MV Decel Time: 203 msec     TR Vmax:        316.00 cm/s MV E velocity: 117.00 cm/s  SHUNTS                             Systemic Diam: 1.80 cm Harold Hedge MD Electronically signed by Harold Hedge MD Signature Date/Time: 09/01/2020/6:55:03 AM    Final                LOS: 0 days   Nitzia Perren  [facility]   Pager on [email or web address]. If 7PM-7AM, please contact night-coverage at [email or web address]     09/01/2020, 2:41 PM

## 2020-09-01 NOTE — Progress Notes (Signed)
Occupational Therapy Treatment Patient Details Name: Bridget [name] MRN: [id number] DOB: Jan 25, 1926 Today's Date: 09/01/2020    History of present illness Pt is a 87 y.o. female presenting to hospital 3/21 with intermittent SOB for past couple of days; worsens with exertion.  Pt also noted with 3 pillow orthopnea, LE swelling, and diffuse wheezing.  Pt admitted with COPD with acute exacerbation and probable acute diastolic dysfunction CHF.  PMH includes htn, asthma, COPD (no home O2).  PMH includes (+) COVID 19 07/05/20, asthma, COPD, htn, acute lower UTI, breast surgery, hernia repair, mild dementia.   OT comments  Upon entering the room, pt seated in recliner chair with no c/o pain. Pt agreeable and very motivated for bathing tasks with focus on sit <>Stand. Pt bathing with increased time and min cuing for sequencing. Pt would wash some body parts multiple times because she was unable to remember what she had previously washed. Pt standing to wash buttocks and peri area with min guard - min A without UE support. Pt able to demonstrate figure four position in sitting for LB dressing and hygiene. Pt returning to recliner chair at end of session. All needs within reach and chair alarm activated.     Follow Up Recommendations  Home health OT;Supervision/Assistance - 24 hour    Equipment Recommendations  None recommended by OT       Precautions / Restrictions Precautions Precautions: Fall       Mobility Bed Mobility               General bed mobility comments: seated in recliner chair upon entering the room    Transfers Overall transfer level: Needs assistance Equipment used: 1 person hand held assist Transfers: Sit to/from Stand Sit to Stand: Min guard Stand pivot transfers: Min guard            Balance Overall balance assessment: Needs assistance Sitting-balance support: No upper extremity supported;Feet supported Sitting balance-Leahy Scale: Normal Sitting balance -  Comments: steady sitting reaching outside BOS   Standing balance support: Single extremity supported Standing balance-Leahy Scale: Fair Standing balance comment: pt requiring at least single UE support for standing activities                           ADL either performed or assessed with clinical judgement   ADL Overall ADL's : Needs assistance/impaired     Grooming: Wash/dry hands;Wash/dry face;Sitting;Supervision/safety   Upper Body Bathing: Set up;Sitting   Lower Body Bathing: Minimal assistance;Sit to/from stand   Upper Body Dressing : Set up;Sitting Upper Body Dressing Details (indicate cue type and reason): hospital gown Lower Body Dressing: Min guard;Sitting/lateral leans Lower Body Dressing Details (indicate cue type and reason): figure four position in sitting                     Vision Baseline Vision/History: No visual deficits Patient Visual Report: No change from baseline            Cognition Arousal/Alertness: Awake/alert Behavior During Therapy: WFL for tasks assessed/performed Overall Cognitive Status: No family/caregiver present to determine baseline cognitive functioning                                 General Comments: Oriented to person, place, and general situation.  Pertinent Vitals/ Pain       Pain Assessment: No/denies pain         Frequency  Min 2X/week        Progress Toward Goals  OT Goals(current goals can now be found in the care plan section)  Progress towards OT goals: Progressing toward goals  Acute Rehab OT Goals Patient Stated Goal: to improve breathing and mobility OT Goal Formulation: With patient Time For Goal Achievement: 09/14/20 Potential to Achieve Goals: Good  Plan Discharge plan remains appropriate       AM-PAC OT "6 Clicks" Daily Activity     Outcome Measure   Help from another person eating meals?: None Help from another person taking care of  personal grooming?: A Little Help from another person toileting, which includes using toliet, bedpan, or urinal?: A Little Help from another person bathing (including washing, rinsing, drying)?: A Little Help from another person to put on and taking off regular upper body clothing?: None Help from another person to put on and taking off regular lower body clothing?: A Little 6 Click Score: 20    End of Session    OT Visit Diagnosis: Unsteadiness on feet (R26.81);Repeated falls (R29.6);Muscle weakness (generalized) (M62.81);History of falling (Z91.81)   Activity Tolerance Patient tolerated treatment well   Patient Left in bed;with call bell/phone within reach;with chair alarm set   Nurse Communication Mobility status        Time: 0263-7858 OT Time Calculation (min): 39 min  Charges: OT General Charges $OT Visit: 1 Visit OT Treatments $Self Care/Home Management : 38-52 mins  Jackquline Denmark, MS, OTR/L , CBIS ascom [phone]  09/01/20, 4:37 PM

## 2020-09-01 NOTE — Progress Notes (Signed)
Pt ambulated in room to bathroom and to the door. Attempted to have pt walk farther but she stated she was unable to because "her legs are too wobbly, more than usual." Pt returned to chair safely, PPP addressed, chair alarm on. Pt with significant SOB while ambulating, although saturations remained >90%. MD notified.

## 2020-09-02 DIAGNOSIS — I5031 Acute diastolic (congestive) heart failure: Secondary | ICD-10-CM | POA: Diagnosis not present

## 2020-09-02 DIAGNOSIS — I482 Chronic atrial fibrillation, unspecified: Secondary | ICD-10-CM | POA: Diagnosis not present

## 2020-09-02 DIAGNOSIS — J441 Chronic obstructive pulmonary disease with (acute) exacerbation: Secondary | ICD-10-CM

## 2020-09-02 NOTE — Progress Notes (Signed)
Nutrition Follow-up  DOCUMENTATION CODES:   Not applicable  INTERVENTION:   -Continue MVI with minerals daily -MVI with minerals daily  NUTRITION DIAGNOSIS:   Increased nutrient needs related to catabolic illness (COPD) as evidenced by estimated needs.  Ongoing  GOAL:   Patient will meet greater than or equal to 90% of their needs  Progressing   MONITOR:   PO intake,Supplement acceptance,Labs,Weight trends,Skin,I & O's  REASON FOR ASSESSMENT:   Consult COPD Protocol  ASSESSMENT:   87 y.o. female with history of hypertension, asthma, recent COVID 19 in January, mild dementia, Afib, CKD III and COPD not on home oxygen who presents to the emergency department with complaints of intermittent shortness of breath.  Reviewed I/O's: -2.8 L x 24 hours and -4.8 L since admission  UOP: 3 L x 24 hours  Spoke with pt, who repots feeling better today, She estimates she consumed about 75% of her breakfast. Noted she consumed about 80% of her Enlive supplement, reporting "I'm trying to drink these because I know there are good for me".   PTA pt reports good appetite, consuming 3 meals per day (Breakfast: cold or hot cereal; Lunch: soup and sandwich; Dinner: meat, starch, and vegetable).   Pt denies weight loss. She reports her UBW is around 120#.   Discussed importance of good meal and supplement intake to promote healing. Noted pt with generalized fat and muscle depletions, likely related to advanced age.   Labs reviewed.   NUTRITION - FOCUSED PHYSICAL EXAM:  Flowsheet Row Most Recent Value  Orbital Region Mild depletion  Upper Arm Region Mild depletion  Thoracic and Lumbar Region No depletion  Buccal Region No depletion  Temple Region No depletion  Clavicle Bone Region Moderate depletion  Clavicle and Acromion Bone Region Moderate depletion  Scapular Bone Region Moderate depletion  Dorsal Hand Mild depletion  Patellar Region No depletion  Anterior Thigh Region No  depletion  Posterior Calf Region No depletion  Edema (RD Assessment) Moderate  Hair Reviewed  Eyes Reviewed  Mouth Reviewed  Skin Reviewed  Nails Reviewed       Diet Order:   Diet Order            Diet 2 gram sodium Room service appropriate? Yes; Fluid consistency: Thin  Diet effective now                 EDUCATION NEEDS:   Education needs have been addressed  Skin:  Skin Assessment: Reviewed RN Assessment  Last BM:  09/01/20  Height:   Ht Readings from Last 1 Encounters:  08/31/20 5\' 4"  (1.626 m)    Weight:   Wt Readings from Last 1 Encounters:  08/31/20 56.7 kg    Ideal Body Weight:  54.5 kg  BMI:  Body mass index is 21.46 kg/m.  Estimated Nutritional Needs:   Kcal:  1400-1600kcal/day  Protein:  70-80g/day  Fluid:  1.4-1.6L/day    04-24-2005, RD, LDN, CDCES Registered Dietitian II Certified Diabetes Care and Education Specialist Please refer to [facility] for RD and/or RD on-call/weekend/after hours pager

## 2020-09-02 NOTE — TOC Progression Note (Signed)
Transition of Care [facility]) - Progression Note    Patient Details  Name: Bridget [name] MRN: [id number] Date of Birth: Jan 18, 1926  Transition of Care [facility]) CM/SW Contact  Allayne Butcher, RN Phone Number: 09/02/2020, 4:04 PM  Clinical Narrative:    PT is recommending SNF for short term rehab.  Physical therapist was able to call and speak with the patient's daughter to explain how their session went.  Daughter after speaking with PT agrees to SNF.  Bed referrals sent out will follow up with daughter once bed offers have been received.    Expected Discharge Plan: Home w Home Health Services Barriers to Discharge: Continued Medical Work up  Expected Discharge Plan and Services Expected Discharge Plan: Home w Home Health Services   Discharge Planning Services: CM Consult Post Acute Care Choice: Home Health Living arrangements for the past 2 months: Single Family Home                 DME Arranged: N/A DME Agency: NA       HH Arranged: RN,PT,OT,Nurse's Aide HH Agency: [facility] Date [facility] Agency Contacted: 09/01/20 Time HH Agency Contacted: 1434 Representative spoke with at [facility] Agency: Kandee Keen   Social Determinants of Health (SDOH) Interventions    Readmission Risk Interventions No flowsheet data found.

## 2020-09-02 NOTE — [place] FL2 (Signed)
[place] MEDICAID FL2 LEVEL OF CARE SCREENING TOOL     IDENTIFICATION  Patient Name: Bridget [name] Birthdate: [birth year] Sex: female Admission Date (Current Location): 08/31/2020  [place] and [place] Number:  Chiropodist and Address:  [facility], [address] [place], [place] [address]      Provider Number: [id number]  Attending Physician Name and Address:  Charise Killian, MD  Relative Name and Phone Number:  Dolores Hoose (daughter) [phone]    Current Level of Care: Hospital Recommended Level of Care: Skilled Nursing Facility Prior Approval Number:    Date Approved/Denied:   PASRR Number: [id number]  Discharge Plan: SNF    Current Diagnoses: Patient Active Problem List   Diagnosis Date Noted  . COPD with acute exacerbation (HCC) 08/31/2020  . Atrial fibrillation, chronic (HCC) 08/31/2020  . Acute CHF (congestive heart failure) (HCC) 08/31/2020  . CKD (chronic kidney disease), stage III (HCC) 08/31/2020  . HTN (hypertension) 07/06/2020  . Acute lower UTI 07/06/2020  . Acute respiratory disease due to COVID-19 virus 07/05/2020    Orientation RESPIRATION BLADDER Height & Weight     Self,Time,Place  Normal Continent Weight: 56.7 kg Height:  5\' 4"  (162.6 cm)  BEHAVIORAL SYMPTOMS/MOOD NEUROLOGICAL BOWEL NUTRITION STATUS      Continent Diet  AMBULATORY STATUS COMMUNICATION OF NEEDS Skin   Limited Assist Verbally Normal                       Personal Care Assistance Level of Assistance  Bathing,Feeding,Dressing Bathing Assistance: Limited assistance Feeding assistance: Limited assistance Dressing Assistance: Limited assistance     Functional Limitations Info             SPECIAL CARE FACTORS FREQUENCY  PT (By licensed PT),OT (By licensed OT)     PT Frequency: 5 times per week OT Frequency: 5 times per week            Contractures Contractures Info: Not present    Additional Factors Info  Code  Status,Allergies Code Status Info: DNR Allergies Info: NKA           Current Medications (09/02/2020):  This is the current hospital active medication list Current Facility-Administered Medications  Medication Dose Route Frequency Provider Last Rate Last Admin  . 0.9 %  sodium chloride infusion  250 mL Intravenous PRN Agbata, Tochukwu, MD      . AeroChamber Plus Flo-Vu Medium MISC 1 each  1 each Other Once Ward, Kristen N, DO      . albuterol (PROVENTIL) (2.5 MG/3ML) 0.083% nebulizer solution 2.5 mg  2.5 mg Nebulization Q2H PRN Agbata, Tochukwu, MD   2.5 mg at 09/01/20 0540  . enoxaparin (LOVENOX) injection 40 mg  40 mg Subcutaneous Q24H Agbata, Tochukwu, MD   40 mg at 09/02/20 0953  . feeding supplement (ENSURE ENLIVE / ENSURE PLUS) liquid 237 mL  237 mL Oral BID BM Agbata, Tochukwu, MD   237 mL at 09/02/20 1440  . furosemide (LASIX) injection 40 mg  40 mg Intravenous Daily Agbata, Tochukwu, MD   40 mg at 09/02/20 0953  . guaiFENesin-dextromethorphan (ROBITUSSIN DM) 100-10 MG/5ML syrup 5 mL  5 mL Oral Q4H PRN Agbata, Tochukwu, MD   5 mL at 09/01/20 0032  . ipratropium-albuterol (DUONEB) 0.5-2.5 (3) MG/3ML nebulizer solution 3 mL  3 mL Nebulization Once Ward, Kristen N, DO      . lisinopril (ZESTRIL) tablet 20 mg  20 mg Oral Daily Agbata, Tochukwu, MD  20 mg at 09/02/20 0952  . multivitamin with minerals tablet 1 tablet  1 tablet Oral Daily Agbata, Tochukwu, MD   1 tablet at 09/02/20 0952  . predniSONE (DELTASONE) tablet 40 mg  40 mg Oral Q breakfast Agbata, Tochukwu, MD   40 mg at 09/02/20 0952  . sodium chloride flush (NS) 0.9 % injection 3 mL  3 mL Intravenous Q12H Agbata, Tochukwu, MD   3 mL at 09/02/20 0953  . sodium chloride flush (NS) 0.9 % injection 3 mL  3 mL Intravenous PRN Agbata, Tochukwu, MD   3 mL at 09/01/20 2108     Discharge Medications: Please see discharge summary for a list of discharge medications.  Relevant Imaging Results:  Relevant Lab Results:   Additional  Information SS# [id number]  Allayne Butcher, RN

## 2020-09-02 NOTE — Progress Notes (Addendum)
PROGRESS NOTE    Bridget [name]  QQI:[id number] DOB: Jun 30, 1925 DOA: 08/31/2020 PCP: Bridget Bickers, MD   Assessment & Plan:   Principal Problem:   COPD with acute exacerbation (HCC) Active Problems:   HTN (hypertension)   Atrial fibrillation, chronic (HCC)   Acute CHF (congestive heart failure) (HCC)   CKD (chronic kidney disease), stage III (HCC)   Acute on chronic diastolic CHF exacerbation: continue on IV lasix. Monitor I/Os and daily weights. Neg approx 2.7L. Echo shows EF 60 to 65%, grade 1 diastolic dysfunction, mild mitral regurgitation.  COPD exacerbation: continue on prednisone, bronchodilators and encourage incentive spirometry. Weaned off of supplemental oxygen  Acute hypoxic respiratory failure: resolved  Unsteady gait: OT recs home health   Chronic atrial fibrillation: not on anticoagulation secondary to high fall risk   CKDIIIa: Cr is within baseline range  Leukocytosis: likely steroid induced   Hx of dementia: continue w/ supportive care   DVT prophylaxis: lovenox  Code Status: DNR  Family Communication:  Discussed w/ pt's daughter, Bridget [name], and answered her questions Disposition Plan: likely d/c back home   Level of care: Med-Surg   Status is: Observation  The patient will require care spanning > 2 midnights and should be moved to inpatient because: IV treatments appropriate due to intensity of illness or inability to take PO and Inpatient level of care appropriate due to severity of illness  Dispo: The patient is from: Home              Anticipated d/c is to: Home              Patient currently is not medically stable to d/c.   Difficult to place patient No        Consultants:      Procedures:    Antimicrobials:    Subjective: Pt c/o shortness of breath especially w/ moving   Objective: Vitals:   09/01/20 1657 09/01/20 1947 09/01/20 2358 09/02/20 0507  BP: 137/75 (!) 151/80 (!) 154/86 (!) 155/83  Pulse: 80 79 73 64   Resp:  17 16 17   Temp:  97.8 F (36.6 C) 97.8 F (36.6 C) (!) 97.5 F (36.4 C)  TempSrc:  Oral  Oral  SpO2: 97% 95% 96% 96%  Weight:      Height:        Intake/Output Summary (Last 24 hours) at 09/02/2020 0759 Last data filed at 09/02/2020 0500 Gross per 24 hour  Intake 240 ml  Output 3000 ml  Net -2760 ml   Filed Weights   08/31/20 0533  Weight: 56.7 kg    Examination:  General exam: Appears calm but uncomfortable  Respiratory system: diminished breath sounds b/l  Cardiovascular system: irregularly irregular. No rubs, gallops or clicks. . Gastrointestinal system: Abdomen is nondistended, soft and nontender, normal bowel sounds heard. Central nervous system: Alert and oriented. Moves all extremities  Psychiatry: Judgement and insight appear normal. Flat mood and affect     Data Reviewed: I have personally reviewed following labs and imaging studies  CBC: Recent Labs  Lab 08/31/20 0535 09/01/20 0517  WBC 8.9 14.5*  NEUTROABS 6.3  --   HGB 15.9* 15.7*  HCT 48.6* 48.4*  MCV 94.9 94.5  PLT 182 190   Basic Metabolic Panel: Recent Labs  Lab 08/31/20 0535 09/01/20 0517  NA 133* 138  K 4.2 4.5  CL 98 101  CO2 27 26  GLUCOSE 144* 117*  BUN 24* 25*  CREATININE 0.91 0.93  CALCIUM 9.5  9.3   GFR: Estimated Creatinine Clearance: 33.3 mL/min (by C-G formula based on SCr of 0.93 mg/dL). Liver Function Tests: No results for input(s): AST, ALT, ALKPHOS, BILITOT, PROT, ALBUMIN in the last 168 hours. No results for input(s): LIPASE, AMYLASE in the last 168 hours. No results for input(s): AMMONIA in the last 168 hours. Coagulation Profile: No results for input(s): INR, PROTIME in the last 168 hours. Cardiac Enzymes: No results for input(s): CKTOTAL, CKMB, CKMBINDEX, TROPONINI in the last 168 hours. BNP (last 3 results) No results for input(s): PROBNP in the last 8760 hours. HbA1C: No results for input(s): HGBA1C in the last 72 hours. CBG: No results for  input(s): GLUCAP in the last 168 hours. Lipid Profile: No results for input(s): CHOL, HDL, LDLCALC, TRIG, CHOLHDL, LDLDIRECT in the last 72 hours. Thyroid Function Tests: No results for input(s): TSH, T4TOTAL, FREET4, T3FREE, THYROIDAB in the last 72 hours. Anemia Panel: No results for input(s): VITAMINB12, FOLATE, FERRITIN, TIBC, IRON, RETICCTPCT in the last 72 hours. Sepsis Labs: No results for input(s): PROCALCITON, LATICACIDVEN in the last 168 hours.  Recent Results (from the past 240 hour(s))  SARS CORONAVIRUS 2 (TAT 6-24 HRS) Nasopharyngeal Nasopharyngeal Swab     Status: None   Collection Time: 08/31/20 10:34 AM   Specimen: Nasopharyngeal Swab  Result Value Ref Range Status   SARS Coronavirus 2 NEGATIVE NEGATIVE Final    Comment: (NOTE) SARS-CoV-2 target nucleic acids are NOT DETECTED.  The SARS-CoV-2 RNA is generally detectable in upper and lower respiratory specimens during the acute phase of infection. Negative results do not preclude SARS-CoV-2 infection, do not rule out co-infections with other pathogens, and should not be used as the sole basis for treatment or other patient management decisions. Negative results must be combined with clinical observations, patient history, and epidemiological information. The expected result is Negative.  Fact Sheet for Patients: HairSlick.no  Fact Sheet for Healthcare Providers: quierodirigir.com  This test is not yet approved or cleared by the [place] FDA and  has been authorized for detection and/or diagnosis of SARS-CoV-2 by FDA under an Emergency Use Authorization (EUA). This EUA will remain  in effect (meaning this test can be used) for the duration of the COVID-19 declaration under Se ction 564(b)(1) of the Act, 21 U.S.C. section 360bbb-3(b)(1), unless the authorization is terminated or revoked sooner.  Performed at [facility], [address],  [place], [place] [address]          Radiology Studies: ECHOCARDIOGRAM COMPLETE  Result Date: 09/01/2020    ECHOCARDIOGRAM REPORT   Patient Name:   Bridget [name] Date of Exam: 08/31/2020 Medical Rec #:  [id number]      Height:       64.0 in Accession #:    [id number]     Weight:       125.0 lb Date of Birth:  08/13/1925      BSA:          1.602 m Patient Age:    87 years       BP:           118/67 mmHg Patient Gender: F              HR:           96 bpm. Exam Location:  [facility] Procedure: 2D Echo, Cardiac Doppler and Color Doppler Indications:     CHF-Acute Diastolic I50.31  History:         Patient has no prior  history of Echocardiogram examinations.                  Risk Factors:Hypertension.  Sonographer:     Neysa Bonitohristy Roar Referring Phys:  [id number] TOCHUKWU AGBATA Diagnosing Phys: Harold HedgeKenneth Fath MD IMPRESSIONS  1. Left ventricular ejection fraction, by estimation, is 60 to 65%. The left ventricle has normal function. The left ventricle has no regional wall motion abnormalities. There is mild left ventricular hypertrophy. Left ventricular diastolic parameters are consistent with Grade I diastolic dysfunction (impaired relaxation).  2. Right ventricular systolic function is normal. The right ventricular size is normal.  3. Right atrial size was mildly dilated.  4. The mitral valve is grossly normal. Mild mitral valve regurgitation.  5. The aortic valve was not well visualized. Aortic valve regurgitation is mild. FINDINGS  Left Ventricle: Left ventricular ejection fraction, by estimation, is 60 to 65%. The left ventricle has normal function. The left ventricle has no regional wall motion abnormalities. The left ventricular internal cavity size was normal in size. There is  mild left ventricular hypertrophy. Left ventricular diastolic parameters are consistent with Grade I diastolic dysfunction (impaired relaxation). Right Ventricle: The right ventricular size is normal. No increase in right ventricular wall thickness.  Right ventricular systolic function is normal. Left Atrium: Left atrial size was normal in size. Right Atrium: Right atrial size was mildly dilated. Pericardium: There is no evidence of pericardial effusion. Mitral Valve: The mitral valve is grossly normal. Mild mitral valve regurgitation. Tricuspid Valve: The tricuspid valve is grossly normal. Tricuspid valve regurgitation is mild. Aortic Valve: The aortic valve was not well visualized. Aortic valve regurgitation is mild. Aortic valve peak gradient measures 7.7 mmHg. Pulmonic Valve: The pulmonic valve was not well visualized. Pulmonic valve regurgitation is trivial. Aorta: The aortic root was not well visualized. IAS/Shunts: The interatrial septum was not well visualized.  LEFT VENTRICLE PLAX 2D LVIDd:         4.05 cm  Diastology LVIDs:         2.79 cm  LV e' medial:    8.38 cm/s LV PW:         0.91 cm  LV E/e' medial:  14.0 LV IVS:        1.10 cm  LV e' lateral:   13.80 cm/s LVOT diam:     1.80 cm  LV E/e' lateral: 8.5 LVOT Area:     2.54 cm  RIGHT VENTRICLE RV Mid diam:    2.70 cm RV S prime:     13.80 cm/s TAPSE (M-mode): 1.6 cm LEFT ATRIUM             Index       RIGHT ATRIUM           Index LA diam:        4.00 cm 2.50 cm/m  RA Area:     19.10 cm LA Vol (A2C):   66.8 ml 41.69 ml/m RA Volume:   46.60 ml  29.09 ml/m LA Vol (A4C):   59.3 ml 37.01 ml/m LA Biplane Vol: 63.4 ml 39.57 ml/m  AORTIC VALVE                PULMONIC VALVE AV Area (Vmax): 1.56 cm    PV Vmax:        0.91 m/s AV Vmax:        139.00 cm/s PV Peak grad:   3.3 mmHg AV Peak Grad:   7.7 mmHg    RVOT Peak grad: 1  mmHg LVOT Vmax:      85.10 cm/s  AORTA Ao Root diam: 3.00 cm MITRAL VALVE                TRICUSPID VALVE MV Area (PHT): 3.74 cm     TR Peak grad:   39.9 mmHg MV Decel Time: 203 msec     TR Vmax:        316.00 cm/s MV E velocity: 117.00 cm/s                             SHUNTS                             Systemic Diam: 1.80 cm Harold Hedge MD Electronically signed by Harold Hedge MD  Signature Date/Time: 09/01/2020/6:55:03 AM    Final         Scheduled Meds: . AeroChamber Plus Flo-Vu Medium  1 each Other Once  . enoxaparin (LOVENOX) injection  40 mg Subcutaneous Q24H  . feeding supplement  237 mL Oral BID BM  . furosemide  40 mg Intravenous Daily  . ipratropium-albuterol  3 mL Nebulization Once  . lisinopril  20 mg Oral Daily  . multivitamin with minerals  1 tablet Oral Daily  . predniSONE  40 mg Oral Q breakfast  . sodium chloride flush  3 mL Intravenous Q12H   Continuous Infusions: . sodium chloride       LOS: 0 days    Time spent: 33 mins     Charise Killian, MD [facility] Pager [phone]  If 7PM-7AM, please contact night-coverage 09/02/2020, 7:59 AM

## 2020-09-02 NOTE — Progress Notes (Signed)
Physical Therapy Treatment Patient Details Name: Bridget [name] MRN: [id number] DOB: 1925/08/04 Today's Date: 09/02/2020    History of Present Illness Pt is a 87 y.o. female presenting to hospital 3/21 with intermittent SOB for past couple of days; worsens with exertion.  Pt also noted with 3 pillow orthopnea, LE swelling, and diffuse wheezing.  Pt admitted with COPD with acute exacerbation and probable acute diastolic dysfunction CHF.  PMH includes htn, asthma, COPD (no home O2).  PMH includes (+) COVID 19 07/05/20, asthma, COPD, htn, acute lower UTI, breast surgery, hernia repair, mild dementia.    PT Comments    Pt in recliner, ready for session.  Stated she did some walking yesterday with RW and feels better with it.  Trial of SPC.  She is unable to stand and manage SPC on her own.  Min guard/assist needed.  Stated she uses cane in L hand at home but when standing, keeps SPC in right and uses L on bed for support as she takes several hesitant steps forward.  Asked pt to sit on bed and RW obtained.  With RW she remains hesitant and is able to walk to door and back with RW and min a x 1 with generally poor gait mechanics and walker position.  She keeps walker too far in front of her and tries to leave it to the side when turning back to chair requiring mod cues.  She stated she could not walk further due to SOB, weakness and general fear.  Sats 90% on room air but some wheezing/audible tightness noted.  Discussed home discharge plan.  Pt stated she is concerned about her ability to manage at home.  Daughter lives nearby and she plans to stay with her but she is not able to provide 24 hour a day help.  Gait remains limited and requires min a x 1 for safety and heavy cues with walker.  She stated she is interested in rehab if available to her.  Stated she has gotten generally weaker at home and is fearful on having to come back given minimal improvement in strength and mobility during admission.  Will  change recs to SNF.  Discussed with primary PT.  Will relay to MD and TOC.   Follow Up Recommendations  Home health PT     Equipment Recommendations  Other (comment) (pt has RW and SPC at home already)    Recommendations for Other Services OT consult     Precautions / Restrictions Precautions Precautions: Fall Restrictions Weight Bearing Restrictions: No    Mobility  Bed Mobility               General bed mobility comments: seated in recliner chair upon entering the room    Transfers Overall transfer level: Needs assistance Equipment used: 1 person hand held assist Transfers: Sit to/from Stand Sit to Stand: Min assist;Min guard         General transfer comment: poor hand placements and overall safety.  Ambulation/Gait Ambulation/Gait assistance: Min assist Gait Distance (Feet): 20 Feet Assistive device: Rolling walker (2 wheeled);Straight cane Gait Pattern/deviations: Step-to pattern;Decreased step length - right;Decreased step length - left;Trunk flexed Gait velocity: decreased   General Gait Details: poor gait with SPC only slightly improved with RW.  Limited by SOB and fatigue/weakness   Stairs             Wheelchair Mobility    Modified Rankin (Stroke Patients Only)       Balance Overall balance assessment:  Needs assistance Sitting-balance support: No upper extremity supported;Feet supported Sitting balance-Leahy Scale: Normal     Standing balance support: Single extremity supported Standing balance-Leahy Scale: Poor Standing balance comment: generally hesitant with all mobility - self limits due to fear of falling, SOB and weakness despite RW use                            Cognition Arousal/Alertness: Awake/alert Behavior During Therapy: WFL for tasks assessed/performed Overall Cognitive Status: Within Functional Limits for tasks assessed                                        Exercises       General Comments        Pertinent Vitals/Pain Pain Assessment: Faces Faces Pain Scale: Hurts little more Pain Location: L shoulder Pain Descriptors / Indicators: Aching;Sore Pain Intervention(s): Limited activity within patient's tolerance;Monitored during session    Home Living                      Prior Function            PT Goals (current goals can now be found in the care plan section) Progress towards PT goals: Progressing toward goals    Frequency    Min 2X/week      PT Plan      Co-evaluation              AM-PAC PT "6 Clicks" Mobility   Outcome Measure  Help needed turning from your back to your side while in a flat bed without using bedrails?: None Help needed moving from lying on your back to sitting on the side of a flat bed without using bedrails?: A Little Help needed moving to and from a bed to a chair (including a wheelchair)?: A Little Help needed standing up from a chair using your arms (e.g., wheelchair or bedside chair)?: A Little Help needed to walk in hospital room?: A Lot Help needed climbing 3-5 steps with a railing? : A Lot 6 Click Score: 17    End of Session Equipment Utilized During Treatment: Gait belt Activity Tolerance: Patient tolerated treatment well Patient left: in chair;with call bell/phone within reach;with chair alarm set;with nursing/sitter in room Nurse Communication: Mobility status;Precautions (pt incontinent of urine during session) PT Visit Diagnosis: Other abnormalities of gait and mobility (R26.89);Unsteadiness on feet (R26.81);Muscle weakness (generalized) (M62.81)     Time: 4098-1191 PT Time Calculation (min) (ACUTE ONLY): 10 min  Charges:  $Gait Training: 8-22 mins                    Danielle Dess, PTA 09/02/20, 9:10 AM

## 2020-09-03 DIAGNOSIS — I5031 Acute diastolic (congestive) heart failure: Secondary | ICD-10-CM | POA: Diagnosis not present

## 2020-09-03 DIAGNOSIS — F0390 Unspecified dementia without behavioral disturbance: Secondary | ICD-10-CM

## 2020-09-03 DIAGNOSIS — J441 Chronic obstructive pulmonary disease with (acute) exacerbation: Secondary | ICD-10-CM | POA: Diagnosis not present

## 2020-09-03 LAB — CBC
HCT: 47.5 % — ABNORMAL HIGH (ref 36.0–46.0)
Hemoglobin: 16.2 g/dL — ABNORMAL HIGH (ref 12.0–15.0)
MCH: 32 pg (ref 26.0–34.0)
MCHC: 34.1 g/dL (ref 30.0–36.0)
MCV: 93.7 fL (ref 80.0–100.0)
Platelets: 214 10*3/uL (ref 150–400)
RBC: 5.07 MIL/uL (ref 3.87–5.11)
RDW: 14.6 % (ref 11.5–15.5)
WBC: 11 10*3/uL — ABNORMAL HIGH (ref 4.0–10.5)
nRBC: 0 % (ref 0.0–0.2)

## 2020-09-03 LAB — BASIC METABOLIC PANEL
Anion gap: 9 (ref 5–15)
BUN: 40 mg/dL — ABNORMAL HIGH (ref 8–23)
CO2: 30 mmol/L (ref 22–32)
Calcium: 9.2 mg/dL (ref 8.9–10.3)
Chloride: 98 mmol/L (ref 98–111)
Creatinine, Ser: 0.94 mg/dL (ref 0.44–1.00)
GFR, Estimated: 57 mL/min — ABNORMAL LOW (ref >60)
Glucose, Bld: 88 mg/dL (ref 70–99)
Potassium: 4.4 mmol/L (ref 3.5–5.1)
Sodium: 137 mmol/L (ref 135–145)

## 2020-09-03 NOTE — Progress Notes (Signed)
Physical Therapy Treatment Patient Details Name: Bridget [name] MRN: [id number] DOB: Jul 17, 1925 Today's Date: 09/03/2020    History of Present Illness Pt is a 87 y.o. female presenting to hospital 3/21 with intermittent SOB for past couple of days; worsens with exertion.  Pt also noted with 3 pillow orthopnea, LE swelling, and diffuse wheezing.  Pt admitted with COPD with acute exacerbation and probable acute diastolic dysfunction CHF.  PMH includes htn, asthma, COPD (no home O2).  PMH includes (+) COVID 19 07/05/20, asthma, COPD, htn, acute lower UTI, breast surgery, hernia repair, mild dementia.    PT Comments    Patient received in recliner. She seems slightly disoriented, seemed to be mixing coffee into her tea. Reports she needs to use bathroom. Patient requires min guard for sit to stand and to pivot to [facility]. Patient ambulated with RW and min guard 30 feet. She has decreased safety awareness with mobility pushing walker too far out and getting outside RW. Patient is easily fatigued with mobility and exercise. She will continue to benefit from skilled PT while here to improve functional independence, safety and activity tolerance.    Follow Up Recommendations  SNF     Equipment Recommendations  None recommended by PT    Recommendations for Other Services       Precautions / Restrictions Precautions Precautions: Fall Restrictions Weight Bearing Restrictions: No    Mobility  Bed Mobility               General bed mobility comments: seated in recliner chair upon entering the room    Transfers Overall transfer level: Needs assistance Equipment used: 1 person hand held assist Transfers: Sit to/from Stand;Stand Pivot Transfers Sit to Stand: Min assist;Min guard Stand pivot transfers: Min assist;Min guard       General transfer comment: poor hand placements and overall safety.  Ambulation/Gait Ambulation/Gait assistance: Min guard Gait Distance (Feet): 30  Feet Assistive device: Rolling walker (2 wheeled) Gait Pattern/deviations: Step-through pattern;Trunk flexed Gait velocity: decreased   General Gait Details: patient with decreased safety awareness using RW. Keeps the walker too far ahead, gets outside of the walker especially with turning.   Stairs             Wheelchair Mobility    Modified Rankin (Stroke Patients Only)       Balance Overall balance assessment: Needs assistance Sitting-balance support: Feet supported Sitting balance-Leahy Scale: Normal     Standing balance support: Bilateral upper extremity supported;During functional activity Standing balance-Leahy Scale: Fair Standing balance comment: generally unsafe with mobility, limited by sob and fatigue                            Cognition Arousal/Alertness: Awake/alert Behavior During Therapy: WFL for tasks assessed/performed Overall Cognitive Status: No family/caregiver present to determine baseline cognitive functioning                                 General Comments: Oriented to person, place, and general situation.      Exercises Other Exercises Other Exercises: Seated LAQ, marching, AP, hip abd/add,  x 10 reps,  STS x 5    General Comments        Pertinent Vitals/Pain Pain Assessment: No/denies pain    Home Living  Prior Function            PT Goals (current goals can now be found in the care plan section) Acute Rehab PT Goals Patient Stated Goal: to improve breathing and mobility PT Goal Formulation: With patient Time For Goal Achievement: 09/14/20 Potential to Achieve Goals: Good Progress towards PT goals: Progressing toward goals    Frequency    Min 2X/week      PT Plan Current plan remains appropriate    Co-evaluation              AM-PAC PT "6 Clicks" Mobility   Outcome Measure  Help needed turning from your back to your side while in a flat bed without  using bedrails?: None Help needed moving from lying on your back to sitting on the side of a flat bed without using bedrails?: A Little Help needed moving to and from a bed to a chair (including a wheelchair)?: A Little Help needed standing up from a chair using your arms (e.g., wheelchair or bedside chair)?: A Little Help needed to walk in hospital room?: A Lot Help needed climbing 3-5 steps with a railing? : A Lot 6 Click Score: 17    End of Session Equipment Utilized During Treatment: Gait belt Activity Tolerance: Patient tolerated treatment well;Patient limited by fatigue Patient left: in chair;with call bell/phone within reach;with chair alarm set Nurse Communication: Mobility status PT Visit Diagnosis: Other abnormalities of gait and mobility (R26.89);Unsteadiness on feet (R26.81);Muscle weakness (generalized) (M62.81)     Time: 5364-6803 PT Time Calculation (min) (ACUTE ONLY): 23 min  Charges:  $Gait Training: 8-22 mins $Therapeutic Exercise: 8-22 mins                     [employer], PT, GCS 09/03/20,4:12 PM

## 2020-09-03 NOTE — Progress Notes (Signed)
0751 MEWS score YELLOW, Vitals Reassess MEWS score now GREEN  Temp: 98.3 HR: 85 BP: 126/57 O2: 96 (rm air)

## 2020-09-03 NOTE — Progress Notes (Addendum)
PROGRESS NOTE    Bridget [name]  AYT:[id number] DOB: Jul 31, 1925 DOA: 08/31/2020 PCP: Glori Bickers, MD   Assessment & Plan:   Principal Problem:   COPD with acute exacerbation (HCC) Active Problems:   HTN (hypertension)   Atrial fibrillation, chronic (HCC)   Acute CHF (congestive heart failure) (HCC)   CKD (chronic kidney disease), stage III (HCC)   Acute on chronic diastolic CHF exacerbation: improving. Continue on IV lasix. Monitor daily weights and monitor I/Os. Neg approx neg . Echo shows EF 60-65%, grade 1 diastolic dysfunction & mild mitral regurgitation   COPD exacerbation: continue on bronchodilators, prednisone & encourage incentive spirometry. Weaned off of supplemental oxygen  Acute hypoxic respiratory failure: resolved  Unsteady gait: therapy now recommending SNF   Chronic atrial fibrillation: not on anticoagulation secondary to high fall risk   CKDIIIa: Cr is stable. Avoid nephrotoxic meds   Leukocytosis: likely secondary to steroid use   Hx of dementia: continue w/ supportive care   DVT prophylaxis: lovenox  Code Status: DNR  Family Communication:  Discussed w/ pt's daughter, [name], and answered her questions Disposition Plan: likely d/c back home   Level of care: Med-Surg   Status is: Observation  The patient will require care spanning > 2 midnights and should be moved to inpatient because: IV treatments appropriate due to intensity of illness or inability to take PO and Inpatient level of care appropriate due to severity of illness  Dispo: The patient is from: Home              Anticipated d/c is to: Home              Patient currently is not medically stable to d/c.   Difficult to place patient No        Consultants:      Procedures:    Antimicrobials:    Subjective: Pt denies any shortness of breath at rest   Objective: Vitals:   09/03/20 0443 09/03/20 0751 09/03/20 0900 09/03/20 1147  BP: (!) 124/92 (!) 140/99  (!) 126/57 125/75  Pulse: 62 (!) 58 85 86  Resp: 16 18 19 18   Temp: 97.6 F (36.4 C) (!) 94.6 F (34.8 C) 98.3 F (36.8 C) 98.4 F (36.9 C)  TempSrc:   Oral Oral  SpO2: 94% 97%  91%  Weight:      Height:        Intake/Output Summary (Last 24 hours) at 09/03/2020 1252 Last data filed at 09/03/2020 1100 Gross per 24 hour  Intake 340 ml  Output 1000 ml  Net -660 ml   Filed Weights   08/31/20 0533  Weight: 56.7 kg    Examination:  General exam: Appears calm & comfortable   Respiratory system: decreased breath sounds b/l. No wheezes, rales  Cardiovascular system: irregularly irregular. No rubs or clicks  Gastrointestinal system: Abd is soft, NT, ND & normal bowel sounds  Central nervous system: Alert and awake. Moves all 4 extremities  Psychiatry: Judgement and insight appear abnormal. Flat mood and affect     Data Reviewed: I have personally reviewed following labs and imaging studies  CBC: Recent Labs  Lab 08/31/20 0535 09/01/20 0517 09/03/20 0550  WBC 8.9 14.5* 11.0*  NEUTROABS 6.3  --   --   HGB 15.9* 15.7* 16.2*  HCT 48.6* 48.4* 47.5*  MCV 94.9 94.5 93.7  PLT 182 190 214   Basic Metabolic Panel: Recent Labs  Lab 08/31/20 0535 09/01/20 0517 09/03/20 0550  NA 133* 138  137  K 4.2 4.5 4.4  CL 98 101 98  CO2 27 26 30   GLUCOSE 144* 117* 88  BUN 24* 25* 40*  CREATININE 0.91 0.93 0.94  CALCIUM 9.5 9.3 9.2   GFR: Estimated Creatinine Clearance: 33 mL/min (by C-G formula based on SCr of 0.94 mg/dL). Liver Function Tests: No results for input(s): AST, ALT, ALKPHOS, BILITOT, PROT, ALBUMIN in the last 168 hours. No results for input(s): LIPASE, AMYLASE in the last 168 hours. No results for input(s): AMMONIA in the last 168 hours. Coagulation Profile: No results for input(s): INR, PROTIME in the last 168 hours. Cardiac Enzymes: No results for input(s): CKTOTAL, CKMB, CKMBINDEX, TROPONINI in the last 168 hours. BNP (last 3 results) No results for input(s):  PROBNP in the last 8760 hours. HbA1C: No results for input(s): HGBA1C in the last 72 hours. CBG: No results for input(s): GLUCAP in the last 168 hours. Lipid Profile: No results for input(s): CHOL, HDL, LDLCALC, TRIG, CHOLHDL, LDLDIRECT in the last 72 hours. Thyroid Function Tests: No results for input(s): TSH, T4TOTAL, FREET4, T3FREE, THYROIDAB in the last 72 hours. Anemia Panel: No results for input(s): VITAMINB12, FOLATE, FERRITIN, TIBC, IRON, RETICCTPCT in the last 72 hours. Sepsis Labs: No results for input(s): PROCALCITON, LATICACIDVEN in the last 168 hours.  Recent Results (from the past 240 hour(s))  SARS CORONAVIRUS 2 (TAT 6-24 HRS) Nasopharyngeal Nasopharyngeal Swab     Status: None   Collection Time: 08/31/20 10:34 AM   Specimen: Nasopharyngeal Swab  Result Value Ref Range Status   SARS Coronavirus 2 NEGATIVE NEGATIVE Final    Comment: (NOTE) SARS-CoV-2 target nucleic acids are NOT DETECTED.  The SARS-CoV-2 RNA is generally detectable in upper and lower respiratory specimens during the acute phase of infection. Negative results do not preclude SARS-CoV-2 infection, do not rule out co-infections with other pathogens, and should not be used as the sole basis for treatment or other patient management decisions. Negative results must be combined with clinical observations, patient history, and epidemiological information. The expected result is Negative.  Fact Sheet for Patients: 09/02/20  Fact Sheet for Healthcare Providers: HairSlick.no  This test is not yet approved or cleared by the [employer]ierodirigir.com FDA and  has been authorized for detection and/or diagnosis of SARS-CoV-2 by FDA under an Emergency Use Authorization (EUA). This EUA will remain  in effect (meaning this test can be used) for the duration of the COVID-19 declaration under Se ction 564(b)(1) of the Act, 21 U.S.C. section 360bbb-3(b)(1), unless  the authorization is terminated or revoked sooner.  Performed at [facility], [address] [place], [place]          Radiology Studies: No results found.      Scheduled Meds: . AeroChamber Plus Flo-Vu Medium  1 each Other Once  . enoxaparin (LOVENOX) injection  40 mg Subcutaneous Q24H  . feeding supplement  237 mL Oral BID BM  . furosemide  40 mg Intravenous Daily  . ipratropium-albuterol  3 mL Nebulization Once  . lisinopril  20 mg Oral Daily  . multivitamin with minerals  1 tablet Oral Daily  . predniSONE  40 mg Oral Q breakfast  . sodium chloride flush  3 mL Intravenous Q12H   Continuous Infusions: . sodium chloride       LOS: 0 days    Time spent: 30 mins     [id number], MD [facility] Pager [phone]  If 7PM-7AM, please contact night-coverage 09/03/2020, 12:52 PM

## 2020-09-03 NOTE — TOC Progression Note (Signed)
Transition of Care [facility]) - Progression Note    Patient Details  Name: AIKO BELKO MRN: [id number] Date of Birth: [birth year]  Transition of Care [facility]) CM/SW Contact  Allayne Butcher, RN Phone Number: 09/03/2020, 1:54 PM  Clinical Narrative:    RNCM presented bed offers for SNF.  Daughter wants to research the offers and will get back to this RNCM with a decision.  I did mention that [facility] could accept patient tomorrow.   Expected Discharge Plan: Home w Home Health Services Barriers to Discharge: Continued Medical Work up  Expected Discharge Plan and Services Expected Discharge Plan: Home w Home Health Services   Discharge Planning Services: CM Consult Post Acute Care Choice: Home Health Living arrangements for the past 2 months: Single Family Home                 DME Arranged: N/A DME Agency: NA       HH Arranged: RN,PT,OT,Nurse's Aide HH Agency: [facility] Date [facility] Agency Contacted: 09/01/20 Time HH Agency Contacted: [phone] Representative spoke with at [facility] Agency: Kandee Keen   Social Determinants of Health (SDOH) Interventions    Readmission Risk Interventions No flowsheet data found.

## 2020-09-03 NOTE — Plan of Care (Signed)
Pt stated that she was having trouble breathing.  O2 sats are 94% on RA.  Gave breathing tx with relief. She has congested but not productive cough.

## 2020-09-04 DIAGNOSIS — I5031 Acute diastolic (congestive) heart failure: Secondary | ICD-10-CM | POA: Diagnosis not present

## 2020-09-04 DIAGNOSIS — N1831 Chronic kidney disease, stage 3a: Secondary | ICD-10-CM

## 2020-09-04 DIAGNOSIS — J441 Chronic obstructive pulmonary disease with (acute) exacerbation: Secondary | ICD-10-CM | POA: Diagnosis not present

## 2020-09-04 LAB — CBC
HCT: 49.2 % — ABNORMAL HIGH (ref 36.0–46.0)
Hemoglobin: 16 g/dL — ABNORMAL HIGH (ref 12.0–15.0)
MCH: 30.4 pg (ref 26.0–34.0)
MCHC: 32.5 g/dL (ref 30.0–36.0)
MCV: 93.4 fL (ref 80.0–100.0)
Platelets: 206 10*3/uL (ref 150–400)
RBC: 5.27 MIL/uL — ABNORMAL HIGH (ref 3.87–5.11)
RDW: 14.5 % (ref 11.5–15.5)
WBC: 10.5 10*3/uL (ref 4.0–10.5)
nRBC: 0 % (ref 0.0–0.2)

## 2020-09-04 LAB — BASIC METABOLIC PANEL
Anion gap: 7 (ref 5–15)
BUN: 41 mg/dL — ABNORMAL HIGH (ref 8–23)
CO2: 30 mmol/L (ref 22–32)
Calcium: 8.6 mg/dL — ABNORMAL LOW (ref 8.9–10.3)
Chloride: 100 mmol/L (ref 98–111)
Creatinine, Ser: 0.94 mg/dL (ref 0.44–1.00)
GFR, Estimated: 57 mL/min — ABNORMAL LOW (ref >60)
Glucose, Bld: 89 mg/dL (ref 70–99)
Potassium: 4 mmol/L (ref 3.5–5.1)
Sodium: 137 mmol/L (ref 135–145)

## 2020-09-04 LAB — RESP PANEL BY RT-PCR (FLU A&B, COVID) ARPGX2
Influenza A by PCR: NEGATIVE
Influenza B by PCR: NEGATIVE
SARS Coronavirus 2 by RT PCR: NEGATIVE

## 2020-09-04 MED ORDER — FUROSEMIDE 20 MG PO TABS: 20.0000 mg | ORAL_TABLET | Freq: Every day | ORAL | 0 refills | Status: DC

## 2020-09-04 MED ORDER — IPRATROPIUM-ALBUTEROL 0.5-2.5 (3) MG/3ML IN SOLN
3.0000 mL | Freq: Four times a day (QID) | RESPIRATORY_TRACT | Status: DC
  Administered 2020-09-04 (×2): 3 mL via RESPIRATORY_TRACT
  Filled 2020-09-04 (×2): qty 3

## 2020-09-04 NOTE — Progress Notes (Signed)
Attempted to call RT for neb treatment per MD request.  No answer.  Will try again when time allows

## 2020-09-04 NOTE — TOC Progression Note (Signed)
Transition of Care [facility]) - Progression Note    Patient Details  Name: Bridget [name] MRN: [id number] Date of Birth: 04/03/1926  Transition of Care [facility]) CM/SW Contact  Allayne Butcher, RN Phone Number: 09/04/2020, 9:33 AM  Clinical Narrative:    Patient's daughter chooses [facility] [employer] [facility] for short term rehab.  RNCM starting insurance auth with [employer].     Expected Discharge Plan: Home w Home Health Services Barriers to Discharge: Continued Medical Work up  Expected Discharge Plan and Services Expected Discharge Plan: Home w Home Health Services   Discharge Planning Services: CM Consult Post Acute Care Choice: Home Health Living arrangements for the past 2 months: Single Family Home                 DME Arranged: N/A DME Agency: NA       HH Arranged: RN,PT,OT,Nurse's Aide HH Agency: [facility] Date [facility] Agency Contacted: 09/01/20 Time HH Agency Contacted: [phone] Representative spoke with at [facility] Agency: Kandee Keen   Social Determinants of Health (SDOH) Interventions    Readmission Risk Interventions No flowsheet data found.

## 2020-09-04 NOTE — Progress Notes (Addendum)
PROGRESS NOTE    Bridget [name]  RCV:[id number] DOB: Oct 25, 1925 DOA: 08/31/2020 PCP: Glori Bickers, MD   Assessment & Plan:   Principal Problem:   COPD with acute exacerbation (HCC) Active Problems:   HTN (hypertension)   Atrial fibrillation, chronic (HCC)   Acute CHF (congestive heart failure) (HCC)   CKD (chronic kidney disease), stage III (HCC)   Acute on chronic diastolic CHF exacerbation: improving. Continue on IV lasix. Monitor daily weights and monitor I/Os. Neg approx neg . Echo shows EF 60-65%, grade 1 diastolic dysfunction & mild mitral regurgitation   COPD exacerbation: continue on bronchodilators and encourage incentive spirometry. Completed steroid course   Acute hypoxic respiratory failure: resolved  Unsteady gait: waiting on SNF placement   Chronic atrial fibrillation: not on anticoagulation secondary to high fall risk   CKDIIIa: Cr is w/in baseline range   Leukocytosis: resolved  Hx of dementia: continue w/ supportive care  DVT prophylaxis: lovenox  Code Status: DNR  Family Communication:  Called pt's daughter but no answer so I left a message  Disposition Plan: d/c to SNF  Level of care: Med-Surg   Status is: Observation  The patient will require care spanning > 2 midnights and should be moved to inpatient because: IV treatments appropriate due to intensity of illness or inability to take PO and Inpatient level of care appropriate due to severity of illness  Dispo: The patient is from: Home              Anticipated d/c is to: SNF              Patient currently is medically stable for d/c    Difficult to place patient No        Consultants:      Procedures:    Antimicrobials:    Subjective: Pt c/o intermittent shortness of breath   Objective: Vitals:   09/03/20 1554 09/03/20 1934 09/04/20 0015 09/04/20 0531  BP: 112/81 111/86 (!) 141/128 (!) 148/80  Pulse: 77 88 66 63  Resp: 19 18 16 18   Temp: 98.1 F (36.7 C)  97.7 F (36.5 C) (!) 97.4 F (36.3 C) (!) 97.4 F (36.3 C)  TempSrc: Oral Oral Oral Oral  SpO2: 93% 94% 98% 96%  Weight:      Height:        Intake/Output Summary (Last 24 hours) at 09/04/2020 0732 Last data filed at 09/03/2020 1100 Gross per 24 hour  Intake 120 ml  Output 1000 ml  Net -880 ml   Filed Weights   08/31/20 0533  Weight: 56.7 kg    Examination:  General exam: Appears comfortable  Respiratory system: diminished breath sounds b/l. Wheezes b/l Cardiovascular system: irregularly irregular. No gallops or clicks   Gastrointestinal system: Abd is soft, NT, ND, & normal bowel sounds  Central nervous system: Alert and awake. Moves all 4 extremities  Psychiatry: Judgement and insight appear abnormal. Flat mood and affect    Data Reviewed: I have personally reviewed following labs and imaging studies  CBC: Recent Labs  Lab 08/31/20 0535 09/01/20 0517 09/03/20 0550 09/04/20 0444  WBC 8.9 14.5* 11.0* 10.5  NEUTROABS 6.3  --   --   --   HGB 15.9* 15.7* 16.2* 16.0*  HCT 48.6* 48.4* 47.5* 49.2*  MCV 94.9 94.5 93.7 93.4  PLT 182 190 214 206   Basic Metabolic Panel: Recent Labs  Lab 08/31/20 0535 09/01/20 0517 09/03/20 0550 09/04/20 0444  NA 133* 138 137 137  K  4.2 4.5 4.4 4.0  CL 98 101 98 100  CO2 27 26 30 30   GLUCOSE 144* 117* 88 89  BUN 24* 25* 40* 41*  CREATININE 0.91 0.93 0.94 0.94  CALCIUM 9.5 9.3 9.2 8.6*   GFR: Estimated Creatinine Clearance: 33 mL/min (by C-G formula based on SCr of 0.94 mg/dL). Liver Function Tests: No results for input(s): AST, ALT, ALKPHOS, BILITOT, PROT, ALBUMIN in the last 168 hours. No results for input(s): LIPASE, AMYLASE in the last 168 hours. No results for input(s): AMMONIA in the last 168 hours. Coagulation Profile: No results for input(s): INR, PROTIME in the last 168 hours. Cardiac Enzymes: No results for input(s): CKTOTAL, CKMB, CKMBINDEX, TROPONINI in the last 168 hours. BNP (last 3 results) No results for  input(s): PROBNP in the last 8760 hours. HbA1C: No results for input(s): HGBA1C in the last 72 hours. CBG: No results for input(s): GLUCAP in the last 168 hours. Lipid Profile: No results for input(s): CHOL, HDL, LDLCALC, TRIG, CHOLHDL, LDLDIRECT in the last 72 hours. Thyroid Function Tests: No results for input(s): TSH, T4TOTAL, FREET4, T3FREE, THYROIDAB in the last 72 hours. Anemia Panel: No results for input(s): VITAMINB12, FOLATE, FERRITIN, TIBC, IRON, RETICCTPCT in the last 72 hours. Sepsis Labs: No results for input(s): PROCALCITON, LATICACIDVEN in the last 168 hours.  Recent Results (from the past 240 hour(s))  SARS CORONAVIRUS 2 (TAT 6-24 HRS) Nasopharyngeal Nasopharyngeal Swab     Status: None   Collection Time: 08/31/20 10:34 AM   Specimen: Nasopharyngeal Swab  Result Value Ref Range Status   SARS Coronavirus 2 NEGATIVE NEGATIVE Final    Comment: (NOTE) SARS-CoV-2 target nucleic acids are NOT DETECTED.  The SARS-CoV-2 RNA is generally detectable in upper and lower respiratory specimens during the acute phase of infection. Negative results do not preclude SARS-CoV-2 infection, do not rule out co-infections with other pathogens, and should not be used as the sole basis for treatment or other patient management decisions. Negative results must be combined with clinical observations, patient history, and epidemiological information. The expected result is Negative.  Fact Sheet for Patients: 09/02/20  Fact Sheet for Healthcare Providers: HairSlick.no  This test is not yet approved or cleared by the [employer] FDA and  has been authorized for detection and/or diagnosis of SARS-CoV-2 by FDA under an Emergency Use Authorization (EUA). This EUA will remain  in effect (meaning this test can be used) for the duration of the COVID-19 declaration under Se ction 564(b)(1) of the Act, 21 U.S.C. section  360bbb-3(b)(1), unless the authorization is terminated or revoked sooner.  Performed at [facility], [address] [place], [place]          Radiology Studies: No results found.      Scheduled Meds: . AeroChamber Plus Flo-Vu Medium  1 each Other Once  . enoxaparin (LOVENOX) injection  40 mg Subcutaneous Q24H  . feeding supplement  237 mL Oral BID BM  . furosemide  40 mg Intravenous Daily  . ipratropium-albuterol  3 mL Nebulization Once  . lisinopril  20 mg Oral Daily  . multivitamin with minerals  1 tablet Oral Daily  . predniSONE  40 mg Oral Q breakfast  . sodium chloride flush  3 mL Intravenous Q12H   Continuous Infusions: . sodium chloride       LOS: 0 days    Time spent: 30 mins     [id number], MD [facility] Pager [phone]  If 7PM-7AM, please contact  night-coverage 09/04/2020, 7:32 AM

## 2020-09-04 NOTE — Progress Notes (Signed)
Occupational Therapy Treatment Patient Details Name: Bridget [name] MRN: [id number] DOB: [birth year] Today's Date: 09/04/2020    History of present illness Pt is a 87 y.o. female presenting to hospital 3/21 with intermittent SOB for past couple of days; worsens with exertion.  Pt also noted with 3 pillow orthopnea, LE swelling, and diffuse wheezing.  Pt admitted with COPD with acute exacerbation and probable acute diastolic dysfunction CHF.  PMH includes htn, asthma, COPD (no home O2).  PMH includes (+) COVID 19 07/05/20, asthma, COPD, htn, acute lower UTI, breast surgery, hernia repair, mild dementia.   OT comments  Upon entering the room, pt seated in recliner chair and finishing lunch. Pt is very pleasant and cooperative throughout and agreeable to OT intervention. Pt declined toileting needs and ambulation this session secondary to fatigue. OT educated and demonstrated B UE strengthening exercises with use of level 1 theraband. Pt performing 2 sets of 20 chest pulls, shoulder elevation, and alternating punches. Pt needing min cuing for technique. Pt needing multiple rest breaks secondary to fatigue. Pt very motivated and reports, " Let me rest a minute and then I want to do some more". Pt engaged in 5 reps of sit <>stand from recliner chair with min guard. Pt making great progress towards goals but still not at functional baseline and fatigues quickly. OT recommendation of short term rehab at discharge to continue to address occupational performance.   Follow Up Recommendations  SNF    Equipment Recommendations  Other (comment) (defer to next venue of care)       Precautions / Restrictions Precautions Precautions: Fall       Mobility Bed Mobility               General bed mobility comments: seated in recliner chair upon entering the room    Transfers Overall transfer level: Needs assistance Equipment used: None Transfers: Sit to/from Stand Sit to Stand: Min guard               Balance Overall balance assessment: Needs assistance Sitting-balance support: Feet supported Sitting balance-Leahy Scale: Normal Sitting balance - Comments: steady sitting reaching outside BOS                                   ADL either performed or assessed with clinical judgement   ADL   Eating/Feeding: Independent                                           Vision Patient Visual Report: No change from baseline            Cognition Arousal/Alertness: Awake/alert Behavior During Therapy: WFL for tasks assessed/performed Overall Cognitive Status: No family/caregiver present to determine baseline cognitive functioning                                 General Comments: Oriented to person, place, and general situation. Pt very pleasant and cooperative.                   Pertinent Vitals/ Pain       Pain Assessment: No/denies pain  Home Living Family/patient expects to be discharged to:: Private residence  Frequency  Min 2X/week        Progress Toward Goals  OT Goals(current goals can now be found in the care plan section)  Progress towards OT goals: Progressing toward goals  Acute Rehab OT Goals Patient Stated Goal: to improve breathing and mobility OT Goal Formulation: With patient Time For Goal Achievement: 09/14/20 Potential to Achieve Goals: Good  Plan Discharge plan needs to be updated       AM-PAC OT "6 Clicks" Daily Activity     Outcome Measure   Help from another person eating meals?: None Help from another person taking care of personal grooming?: A Little Help from another person toileting, which includes using toliet, bedpan, or urinal?: A Little Help from another person bathing (including washing, rinsing, drying)?: A Little Help from another person to put on and taking off regular upper body clothing?: None Help from another  person to put on and taking off regular lower body clothing?: A Little 6 Click Score: 20    End of Session    OT Visit Diagnosis: Unsteadiness on feet (R26.81);Repeated falls (R29.6);Muscle weakness (generalized) (M62.81);History of falling (Z91.81)   Activity Tolerance Patient tolerated treatment well   Patient Left with chair alarm set;in chair;with call bell/phone within reach   Nurse Communication          Time: 1315-1340 OT Time Calculation (min): 25 min  Charges: OT General Charges $OT Visit: 1 Visit OT Treatments $Therapeutic Exercise: 23-37 mins  Jackquline Denmark, MS, OTR/L , CBIS ascom [phone]  09/04/20, 1:44 PM

## 2020-09-04 NOTE — Discharge Summary (Signed)
Physician Discharge Summary  Bridget [name]:[id number] DOB: 06/28/1925 DOA: 08/31/2020  PCP: Glori Bickers, MD  Admit date: 08/31/2020 Discharge date: 09/04/2020  Admitted From: home  Disposition:  SNF  Recommendations for Outpatient Follow-up:  1. Follow up with PCP in 1 week 2. F/u w/ cardio in 1-2 weeks  Home Health: no  Equipment/Devices:  Discharge Condition: stable  CODE STATUS: full  Diet recommendation: Heart Healthy  Brief/Interim Summary: HPI was taken from Dr. Joylene Igo: Bridget [name] is a 88 y.o. female with medical history significant for COPD, hypertension and mild dementia who presents to the emergency room for evaluation of worsening shortness of breath from her baseline.  Patient states that she normally has trouble breathing but over the last couple of days it has been worse.  Her daughter and caregiver who was at the bedside states that she had to place her mother on supplemental oxygen 2 days ago when her pulse oximetry dropped to 87 -88% at rest.  With ambulation she states that her pulse oximetry was about 87%.  Patient is not on continuous home oxygen but appears to have a conserving device at home.  Shortness of breath is associated with 3 pillow orthopnea and mild lower extremity swelling.  She has a cough that is nonproductive.  Her daughter is concerned that she has puffiness over her eyelids which she states is unusual for the patient. She denies having any fever, no chills, no nausea, no vomiting, no abdominal pain, no dizziness, no lightheadedness, no headache, no blurred vision, no nocturia, no dysuria, no diarrhea, no constipation, no frequency, no palpitations, no diaphoresis. Labs show sodium 133, potassium 4.2, chloride 98, bicarb 27, glucose 144, BUN 24, creatinine 0.91, calcium 9.5, BNP 691, troponin 19 >> 22, white count 8.9, hemoglobin 15.9, hematocrit 48.6, MCV 94.9, RDW 14.1, platelet count 182 Chest x-ray reviewed by me shows hyperinflated lung  fields Twelve-lead EKG reviewed by me shows atrial fibrillation with RVH.   ED Course: Patient is a 87 year old Caucasian female who presents to the emergency room for evaluation of worsening shortness of breath from her baseline for about 3 days associated with 3 pillow orthopnea, lower extremity swelling and diffuse wheezing.  She has a history of COPD and is on home oxygen as needed.  Patient received bronchodilators in the ER as well as systemic steroids and will be referred to observation status for further evaluation.  Hospital course from Dr. Mayford Knife 3/23-3/25/22:   Pt presented w/ shortness of breath likely secondary to COPD exacerbation as well as CHF exacerbation. Pt was treated w/ lasix, bronchodilators, steroids & supplemental oxygen. Supplemental oxygen was able to be weaned off prior to d/c. Pt also completed steroid course while inpatient as well. PT/OT evaluated the pt and recommended SNF. For more information, please see previous progress/consult notes.  Discharge Diagnoses:  Principal Problem:   COPD with acute exacerbation (HCC) Active Problems:   HTN (hypertension)   Atrial fibrillation, chronic (HCC)   Acute CHF (congestive heart failure) (HCC)   CKD (chronic kidney disease), stage III (HCC)  Acute on chronic diastolic CHF exacerbation: continue on  lasix. Monitor daily weights and monitor I/Os. Neg approx neg . Echo shows EF 60-65%, grade 1 diastolic dysfunction & mild mitral regurgitation   COPD exacerbation: continue on bronchodilators and encourage incentive spirometry. Completed steroid course   Acute hypoxic respiratory failure: resolved  Unsteady gait: will go to SNF   Chronic atrial fibrillation: not on anticoagulation secondary to high fall risk  CKDIIIa: Cr is w/in baseline range   Leukocytosis: resolved  Hx of dementia: continue w/ supportive care  Discharge Instructions  Discharge Instructions    Diet - low sodium heart healthy    Complete by: As directed    Discharge instructions   Complete by: As directed    F/u PCP in 1 week. F/u cardiologist in 1-2 weeks   Face-to-face encounter (required for Medicare/Medicaid patients)   Complete by: As directed    I BERNARD AYIKU certify that this patient is under my care and that I, or a nurse practitioner or physician's assistant working with me, had a face-to-face encounter that meets the physician face-to-face encounter requirements with this patient on 09/01/2020. The encounter with the patient was in whole, or in part for the following medical condition(s) which is the primary reason for home health care (List medical condition): COPD, CHF, Debility   The encounter with the patient was in whole, or in part, for the following medical condition, which is the primary reason for home health care: COPD, CHF, Debility   I certify that, based on my findings, the following services are medically necessary home health services:  Nursing Physical therapy     Reason for Medically Necessary Home Health Services:  Therapy- Investment banker, operationalGait Training, Patent examinerTransfer Training and Stair Training Skilled Nursing- Skilled Assessment/Observation     My clinical findings support the need for the above services: Shortness of breath with activity   Further, I certify that my clinical findings support that this patient is homebound due to: Shortness of Breath with activity   Home Health   Complete by: As directed    To provide the following care/treatments:  PT OT RN Home Health Aide     Increase activity slowly   Complete by: As directed      Allergies as of 09/04/2020   No Known Allergies     Medication List    STOP taking these medications   ipratropium 0.02 % nebulizer solution Commonly known as: ATROVENT     TAKE these medications   albuterol (2.5 MG/3ML) 0.083% nebulizer solution Commonly known as: PROVENTIL Take 3 mLs (2.5 mg total) by nebulization every 4 (four) hours as needed for wheezing  or shortness of breath.   furosemide 20 MG tablet Commonly known as: Lasix Take 1 tablet (20 mg total) by mouth daily.   ipratropium-albuterol 0.5-2.5 (3) MG/3ML Soln Commonly known as: DUONEB Take 3 mLs by nebulization 4 (four) times daily.   lisinopril-hydrochlorothiazide 20-12.5 MG tablet Commonly known as: ZESTORETIC Take 1 tablet by mouth daily.       Contact information for after-discharge care    Destination    [facility] .   Service: Skilled Nursing Contact information: [address] [place] [id number] [phone]                 No Known Allergies  Consultations:     Procedures/Studies: DG Chest 2 View  Result Date: 08/31/2020 CLINICAL DATA:  87 year old female with shortness of breath since 20/1 100 hours. EXAM: CHEST - 2 VIEW COMPARISON:  Portable chest 07/05/2020. FINDINGS: Seated AP and lateral views of the chest. Improved left lung base ventilation since January, resolved indistinct opacity there. Increased AP dimension to the chest with exaggerated thoracic kyphosis. Mediastinal contours are stable and within normal limits. Calcified aortic atherosclerosis. No pneumothorax, pulmonary edema, pleural effusion or acute pulmonary opacity. Previous left humerus ORIF. Osteopenia. No  acute osseous abnormality identified. Negative visible bowel gas. IMPRESSION: 1. Pulmonary hyperinflation.  No acute cardiopulmonary abnormality. 2.  Aortic Atherosclerosis (ICD10-I70.0). Electronically Signed   By: Odessa Fleming M.D.   On: 08/31/2020 06:33   ECHOCARDIOGRAM COMPLETE  Result Date: 09/01/2020    ECHOCARDIOGRAM REPORT   Patient Name:   Bridget [name] Date of Exam: 08/31/2020 Medical Rec #:  [id number]      Height:       64.0 in Accession #:    [id number]     Weight:       125.0 lb Date of Birth:  August 27, 1925      BSA:          1.602 m Patient Age:    87 years       BP:            118/67 mmHg Patient Gender: F              HR:           96 bpm. Exam Location:  [facility] Procedure: 2D Echo, Cardiac Doppler and Color Doppler Indications:     CHF-Acute Diastolic I50.31  History:         Patient has no prior history of Echocardiogram examinations.                  Risk Factors:Hypertension.  Sonographer:     Neysa Bonito Roar Referring Phys:  [id number] JZPHXTAV AGBATA Diagnosing Phys: Harold Hedge MD IMPRESSIONS  1. Left ventricular ejection fraction, by estimation, is 60 to 65%. The left ventricle has normal function. The left ventricle has no regional wall motion abnormalities. There is mild left ventricular hypertrophy. Left ventricular diastolic parameters are consistent with Grade I diastolic dysfunction (impaired relaxation).  2. Right ventricular systolic function is normal. The right ventricular size is normal.  3. Right atrial size was mildly dilated.  4. The mitral valve is grossly normal. Mild mitral valve regurgitation.  5. The aortic valve was not well visualized. Aortic valve regurgitation is mild. FINDINGS  Left Ventricle: Left ventricular ejection fraction, by estimation, is 60 to 65%. The left ventricle has normal function. The left ventricle has no regional wall motion abnormalities. The left ventricular internal cavity size was normal in size. There is  mild left ventricular hypertrophy. Left ventricular diastolic parameters are consistent with Grade I diastolic dysfunction (impaired relaxation). Right Ventricle: The right ventricular size is normal. No increase in right ventricular wall thickness. Right ventricular systolic function is normal. Left Atrium: Left atrial size was normal in size. Right Atrium: Right atrial size was mildly dilated. Pericardium: There is no evidence of pericardial effusion. Mitral Valve: The mitral valve is grossly normal. Mild mitral valve regurgitation. Tricuspid Valve: The tricuspid valve is grossly normal. Tricuspid valve regurgitation is mild. Aortic Valve:  The aortic valve was not well visualized. Aortic valve regurgitation is mild. Aortic valve peak gradient measures 7.7 mmHg. Pulmonic Valve: The pulmonic valve was not well visualized. Pulmonic valve regurgitation is trivial. Aorta: The aortic root was not well visualized. IAS/Shunts: The interatrial septum was not well visualized.  LEFT VENTRICLE PLAX 2D LVIDd:         4.05 cm  Diastology LVIDs:         2.79 cm  LV e' medial:    8.38 cm/s LV PW:         0.91 cm  LV E/e' medial:  14.0 LV IVS:        1.10 cm  LV  e' lateral:   13.80 cm/s LVOT diam:     1.80 cm  LV E/e' lateral: 8.5 LVOT Area:     2.54 cm  RIGHT VENTRICLE RV Mid diam:    2.70 cm RV S prime:     13.80 cm/s TAPSE (M-mode): 1.6 cm LEFT ATRIUM             Index       RIGHT ATRIUM           Index LA diam:        4.00 cm 2.50 cm/m  RA Area:     19.10 cm LA Vol (A2C):   66.8 ml 41.69 ml/m RA Volume:   46.60 ml  29.09 ml/m LA Vol (A4C):   59.3 ml 37.01 ml/m LA Biplane Vol: 63.4 ml 39.57 ml/m  AORTIC VALVE                PULMONIC VALVE AV Area (Vmax): 1.56 cm    PV Vmax:        0.91 m/s AV Vmax:        139.00 cm/s PV Peak grad:   3.3 mmHg AV Peak Grad:   7.7 mmHg    RVOT Peak grad: 1 mmHg LVOT Vmax:      85.10 cm/s  AORTA Ao Root diam: 3.00 cm MITRAL VALVE                TRICUSPID VALVE MV Area (PHT): 3.74 cm     TR Peak grad:   39.9 mmHg MV Decel Time: 203 msec     TR Vmax:        316.00 cm/s MV E velocity: 117.00 cm/s                             SHUNTS                             Systemic Diam: 1.80 cm Harold Hedge MD Electronically signed by Harold Hedge MD Signature Date/Time: 09/01/2020/6:55:03 AM    Final       Subjective: Pt c/o shortness of breat intermittently    Discharge Exam: Vitals:   09/04/20 0743 09/04/20 1027  BP: (!) 133/98   Pulse: 67   Resp: 18   Temp: 97.7 F (36.5 C)   SpO2: 97% 93%   Vitals:   09/04/20 0015 09/04/20 0531 09/04/20 0743 09/04/20 1027  BP: (!) 141/128 (!) 148/80 (!) 133/98   Pulse: 66 63 67    Resp: Temp: (!) 97.4 F (36.3 C) (!) 97.4 F (36.3 C) 97.7 F (36.5 C)   TempSrc: Oral Oral    SpO2: 98% 96% 97% 93%  Weight:      Height:        General: Pt is alert, awake, not in acute distress Cardiovascular: irregularly irregular, no rubs, no gallops Respiratory: diminished breath sounds b/l. Wheezes b/l  Abdominal: Soft, NT, ND, bowel sounds + Extremities: no edema, no cyanosis    The results of significant diagnostics from this hospitalization (including imaging, microbiology, ancillary and laboratory) are listed below for reference.     Microbiology: Recent Results (from the past 240 hour(s))  SARS CORONAVIRUS 2 (TAT 6-24 HRS) Nasopharyngeal Nasopharyngeal Swab     Status: None   Collection Time: 08/31/20 10:34 AM   Specimen: Nasopharyngeal Swab  Result Value Ref Range Status  SARS Coronavirus 2 NEGATIVE NEGATIVE Final    Comment: (NOTE) SARS-CoV-2 target nucleic acids are NOT DETECTED.  The SARS-CoV-2 RNA is generally detectable in upper and lower respiratory specimens during the acute phase of infection. Negative results do not preclude SARS-CoV-2 infection, do not rule out co-infections with other pathogens, and should not be used as the sole basis for treatment or other patient management decisions. Negative results must be combined with clinical observations, patient history, and epidemiological information. The expected result is Negative.  Fact Sheet for Patients: HairSlick.no  Fact Sheet for Healthcare Providers: quierodirigir.com  This test is not yet approved or cleared by the [place] FDA and  has been authorized for detection and/or diagnosis of SARS-CoV-2 by FDA under an Emergency Use Authorization (EUA). This EUA will remain  in effect (meaning this test can be used) for the duration of the COVID-19 declaration under Se ction 564(b)(1) of the Act, 21 U.S.C. section  360bbb-3(b)(1), unless the authorization is terminated or revoked sooner.  Performed at [facility], [address] [place], [place] [address]   Resp Panel by RT-PCR (Flu A&B, Covid) Nasopharyngeal Swab     Status: None   Collection Time: 09/04/20 12:27 PM   Specimen: Nasopharyngeal Swab; Nasopharyngeal(NP) swabs in vial transport medium  Result Value Ref Range Status   SARS Coronavirus 2 by RT PCR NEGATIVE NEGATIVE Final    Comment: (NOTE) SARS-CoV-2 target nucleic acids are NOT DETECTED.  The SARS-CoV-2 RNA is generally detectable in upper respiratory specimens during the acute phase of infection. The lowest concentration of SARS-CoV-2 viral copies this assay can detect is 138 copies/mL. A negative result does not preclude SARS-Cov-2 infection and should not be used as the sole basis for treatment or other patient management decisions. A negative result may occur with  improper specimen collection/handling, submission of specimen other than nasopharyngeal swab, presence of viral mutation(s) within the areas targeted by this assay, and inadequate number of viral copies(<138 copies/mL). A negative result must be combined with clinical observations, patient history, and epidemiological information. The expected result is Negative.  Fact Sheet for Patients:  BloggerCourse.com  Fact Sheet for Healthcare Providers:  SeriousBroker.it  This test is no t yet approved or cleared by the [place] FDA and  has been authorized for detection and/or diagnosis of SARS-CoV-2 by FDA under an Emergency Use Authorization (EUA). This EUA will remain  in effect (meaning this test can be used) for the duration of the COVID-19 declaration under Section 564(b)(1) of the Act, 21 U.S.C.section 360bbb-3(b)(1), unless the authorization is terminated  or revoked sooner.       Influenza A by PCR NEGATIVE NEGATIVE Final   Influenza B by PCR  NEGATIVE NEGATIVE Final    Comment: (NOTE) The Xpert Xpress SARS-CoV-2/FLU/RSV plus assay is intended as an aid in the diagnosis of influenza from Nasopharyngeal swab specimens and should not be used as a sole basis for treatment. Nasal washings and aspirates are unacceptable for Xpert Xpress SARS-CoV-2/FLU/RSV testing.  Fact Sheet for Patients: BloggerCourse.com  Fact Sheet for Healthcare Providers: SeriousBroker.it  This test is not yet approved or cleared by the [place] FDA and has been authorized for detection and/or diagnosis of SARS-CoV-2 by FDA under an Emergency Use Authorization (EUA). This EUA will remain in effect (meaning this test can be used) for the duration of the COVID-19 declaration under Section 564(b)(1) of the Act, 21 U.S.C. section 360bbb-3(b)(1), unless the authorization is terminated or revoked.  Performed at  [facility], [address] [place], [place] [address]      Labs: BNP (last 3 results) Recent Labs    08/31/20 0535  BNP 691.0*   Basic Metabolic Panel: Recent Labs  Lab 08/31/20 0535 09/01/20 0517 09/03/20 0550 09/04/20 0444  NA 133* 138 137 137  K 4.2 4.5 4.4 4.0  CL 98 101 98 100  CO2 27 26 30 30   GLUCOSE 144* 117* 88 89  BUN 24* 25* 40* 41*  CREATININE 0.91 0.93 0.94 0.94  CALCIUM 9.5 9.3 9.2 8.6*   Liver Function Tests: No results for input(s): AST, ALT, ALKPHOS, BILITOT, PROT, ALBUMIN in the last 168 hours. No results for input(s): LIPASE, AMYLASE in the last 168 hours. No results for input(s): AMMONIA in the last 168 hours. CBC: Recent Labs  Lab 08/31/20 0535 09/01/20 0517 09/03/20 0550 09/04/20 0444  WBC 8.9 14.5* 11.0* 10.5  NEUTROABS 6.3  --   --   --   HGB 15.9* 15.7* 16.2* 16.0*  HCT 48.6* 48.4* 47.5* 49.2*  MCV 94.9 94.5 93.7 93.4  PLT 182 190 214 206   Cardiac Enzymes: No results for input(s): CKTOTAL, CKMB, CKMBINDEX, TROPONINI in the last  168 hours. BNP: Invalid input(s): POCBNP CBG: No results for input(s): GLUCAP in the last 168 hours. D-Dimer No results for input(s): DDIMER in the last 72 hours. Hgb A1c No results for input(s): HGBA1C in the last 72 hours. Lipid Profile No results for input(s): CHOL, HDL, LDLCALC, TRIG, CHOLHDL, LDLDIRECT in the last 72 hours. Thyroid function studies No results for input(s): TSH, T4TOTAL, T3FREE, THYROIDAB in the last 72 hours.  Invalid input(s): FREET3 Anemia work up No results for input(s): VITAMINB12, FOLATE, FERRITIN, TIBC, IRON, RETICCTPCT in the last 72 hours. Urinalysis    Component Value Date/Time   COLORURINE YELLOW 07/05/2020 1232   APPEARANCEUR CLOUDY (A) 07/05/2020 1232   LABSPEC 1.030 07/05/2020 1232   PHURINE 5.0 07/05/2020 1232   GLUCOSEU NEGATIVE 07/05/2020 1232   HGBUR SMALL (A) 07/05/2020 1232   BILIRUBINUR NEGATIVE 07/05/2020 1232   KETONESUR NEGATIVE 07/05/2020 1232   PROTEINUR >300 (A) 07/05/2020 1232   NITRITE NEGATIVE 07/05/2020 1232   LEUKOCYTESUR SMALL (A) 07/05/2020 1232   Sepsis Labs Invalid input(s): PROCALCITONIN,  WBC,  LACTICIDVEN Microbiology Recent Results (from the past 240 hour(s))  SARS CORONAVIRUS 2 (TAT 6-24 HRS) Nasopharyngeal Nasopharyngeal Swab     Status: None   Collection Time: 08/31/20 10:34 AM   Specimen: Nasopharyngeal Swab  Result Value Ref Range Status   SARS Coronavirus 2 NEGATIVE NEGATIVE Final    Comment: (NOTE) SARS-CoV-2 target nucleic acids are NOT DETECTED.  The SARS-CoV-2 RNA is generally detectable in upper and lower respiratory specimens during the acute phase of infection. Negative results do not preclude SARS-CoV-2 infection, do not rule out co-infections with other pathogens, and should not be used as the sole basis for treatment or other patient management decisions. Negative results must be combined with clinical observations, patient history, and epidemiological information. The expected result is  Negative.  Fact Sheet for Patients: 09/02/20  Fact Sheet for Healthcare Providers: HairSlick.no  This test is not yet approved or cleared by the [place] FDA and  has been authorized for detection and/or diagnosis of SARS-CoV-2 by FDA under an Emergency Use Authorization (EUA). This EUA will remain  in effect (meaning this test can be used) for the duration of the COVID-19 declaration under Se ction 564(b)(1) of the Act, 21 U.S.C. section 360bbb-3(b)(1), unless the  authorization is terminated or revoked sooner.  Performed at [facility], [address] [place], [place] [address]   Resp Panel by RT-PCR (Flu A&B, Covid) Nasopharyngeal Swab     Status: None   Collection Time: 09/04/20 12:27 PM   Specimen: Nasopharyngeal Swab; Nasopharyngeal(NP) swabs in vial transport medium  Result Value Ref Range Status   SARS Coronavirus 2 by RT PCR NEGATIVE NEGATIVE Final    Comment: (NOTE) SARS-CoV-2 target nucleic acids are NOT DETECTED.  The SARS-CoV-2 RNA is generally detectable in upper respiratory specimens during the acute phase of infection. The lowest concentration of SARS-CoV-2 viral copies this assay can detect is 138 copies/mL. A negative result does not preclude SARS-Cov-2 infection and should not be used as the sole basis for treatment or other patient management decisions. A negative result may occur with  improper specimen collection/handling, submission of specimen other than nasopharyngeal swab, presence of viral mutation(s) within the areas targeted by this assay, and inadequate number of viral copies(<138 copies/mL). A negative result must be combined with clinical observations, patient history, and epidemiological information. The expected result is Negative.  Fact Sheet for Patients:  BloggerCourse.com  Fact Sheet for Healthcare Providers:   SeriousBroker.it  This test is no t yet approved or cleared by the [place] FDA and  has been authorized for detection and/or diagnosis of SARS-CoV-2 by FDA under an Emergency Use Authorization (EUA). This EUA will remain  in effect (meaning this test can be used) for the duration of the COVID-19 declaration under Section 564(b)(1) of the Act, 21 U.S.C.section 360bbb-3(b)(1), unless the authorization is terminated  or revoked sooner.       Influenza A by PCR NEGATIVE NEGATIVE Final   Influenza B by PCR NEGATIVE NEGATIVE Final    Comment: (NOTE) The Xpert Xpress SARS-CoV-2/FLU/RSV plus assay is intended as an aid in the diagnosis of influenza from Nasopharyngeal swab specimens and should not be used as a sole basis for treatment. Nasal washings and aspirates are unacceptable for Xpert Xpress SARS-CoV-2/FLU/RSV testing.  Fact Sheet for Patients: BloggerCourse.com  Fact Sheet for Healthcare Providers: SeriousBroker.it  This test is not yet approved or cleared by the [place] FDA and has been authorized for detection and/or diagnosis of SARS-CoV-2 by FDA under an Emergency Use Authorization (EUA). This EUA will remain in effect (meaning this test can be used) for the duration of the COVID-19 declaration under Section 564(b)(1) of the Act, 21 U.S.C. section 360bbb-3(b)(1), unless the authorization is terminated or revoked.  Performed at [facility], [address], [place], [place] [address]      Time coordinating discharge: Over 30 minutes  SIGNED:   Charise Killian, MD  [facility] 09/04/2020, 1:42 PM Pager   If 7PM-7AM, please contact night-coverage [email or web address]

## 2020-09-04 NOTE — Progress Notes (Signed)
Report given to Trish Mage at [facility]   [phone] going to room 410

## 2020-09-04 NOTE — TOC Transition Note (Signed)
Transition of Care [facility]) - CM/SW Discharge Note   Patient Details  Name: Bridget [name] MRN: [id number] Date of Birth: Aug 07, 1925  Transition of Care [facility]) CM/SW Contact:  Allayne Butcher, RN Phone Number: 09/04/2020, 2:53 PM   Clinical Narrative:    Patient is medically cleared for discharge to [facility] today.  Patient will be going to room 401, bedside RN has called report.  EMS has been set up for transport and patient is next on the list for pick up.  Daughter [name] updated on discharge for today.  Insurance authorization approved and details provided to the facility.    Final next level of care: Skilled Nursing Facility Barriers to Discharge: Barriers Resolved   Patient Goals and CMS Choice Patient states their goals for this hospitalization and ongoing recovery are:: Daughter would like for patient to return home with home health services. CMS Medicare.gov Compare Post Acute Care list provided to:: Patient Represenative (must comment) Choice offered to / list presented to : Adult Children  Discharge Placement PASRR number recieved: 09/02/20            Patient chooses bed at: [facility] Patient to be transferred to facility by: [facility] Name of family member notified: [name] Patient and family notified of of transfer: 09/04/20  Discharge Plan and Services   Discharge Planning Services: CM Consult Post Acute Care Choice: Home Health          DME Arranged: N/A DME Agency: NA       HH Arranged: RN,PT,OT,Nurse's Aide HH Agency: [facility] Date [facility] Agency Contacted: 09/01/20 Time HH Agency Contacted: [id number] Representative spoke with at [facility] Agency: Kandee Keen  Social Determinants of Health (SDOH) Interventions     Readmission Risk Interventions No flowsheet data found.

## 2020-11-07 ENCOUNTER — Emergency Department: Payer: Medicare Other

## 2020-11-07 ENCOUNTER — Inpatient Hospital Stay
Admission: EM | Admit: 2020-11-07 | Discharge: 2020-11-10 | DRG: 189 | Disposition: A | Discharge status: Home Health | Payer: Medicare Other | Attending: Internal Medicine | Admitting: Internal Medicine

## 2020-11-07 ENCOUNTER — Other Ambulatory Visit: Payer: Self-pay

## 2020-11-07 ENCOUNTER — Encounter: Payer: Self-pay | Admitting: Emergency Medicine

## 2020-11-07 DIAGNOSIS — Z7901 Long term (current) use of anticoagulants: Secondary | ICD-10-CM | POA: Diagnosis not present

## 2020-11-07 DIAGNOSIS — I5032 Chronic diastolic (congestive) heart failure: Secondary | ICD-10-CM | POA: Diagnosis present

## 2020-11-07 DIAGNOSIS — R778 Other specified abnormalities of plasma proteins: Secondary | ICD-10-CM | POA: Diagnosis present

## 2020-11-07 DIAGNOSIS — J9621 Acute and chronic respiratory failure with hypoxia: Secondary | ICD-10-CM | POA: Diagnosis present

## 2020-11-07 DIAGNOSIS — I10 Essential (primary) hypertension: Secondary | ICD-10-CM | POA: Diagnosis not present

## 2020-11-07 DIAGNOSIS — Z20822 Contact with and (suspected) exposure to covid-19: Secondary | ICD-10-CM | POA: Diagnosis present

## 2020-11-07 DIAGNOSIS — J9622 Acute and chronic respiratory failure with hypercapnia: Secondary | ICD-10-CM | POA: Diagnosis present

## 2020-11-07 DIAGNOSIS — J441 Chronic obstructive pulmonary disease with (acute) exacerbation: Secondary | ICD-10-CM | POA: Diagnosis present

## 2020-11-07 DIAGNOSIS — N182 Chronic kidney disease, stage 2 (mild): Secondary | ICD-10-CM | POA: Diagnosis not present

## 2020-11-07 DIAGNOSIS — R7989 Other specified abnormal findings of blood chemistry: Secondary | ICD-10-CM | POA: Diagnosis present

## 2020-11-07 DIAGNOSIS — I472 Ventricular tachycardia: Secondary | ICD-10-CM | POA: Diagnosis present

## 2020-11-07 DIAGNOSIS — J9601 Acute respiratory failure with hypoxia: Secondary | ICD-10-CM | POA: Diagnosis not present

## 2020-11-07 DIAGNOSIS — Z87891 Personal history of nicotine dependence: Secondary | ICD-10-CM

## 2020-11-07 DIAGNOSIS — I4821 Permanent atrial fibrillation: Secondary | ICD-10-CM | POA: Diagnosis present

## 2020-11-07 DIAGNOSIS — I248 Other forms of acute ischemic heart disease: Secondary | ICD-10-CM | POA: Diagnosis not present

## 2020-11-07 DIAGNOSIS — I340 Nonrheumatic mitral (valve) insufficiency: Secondary | ICD-10-CM | POA: Diagnosis not present

## 2020-11-07 DIAGNOSIS — I482 Chronic atrial fibrillation, unspecified: Secondary | ICD-10-CM | POA: Diagnosis present

## 2020-11-07 DIAGNOSIS — N183 Chronic kidney disease, stage 3 unspecified: Secondary | ICD-10-CM | POA: Diagnosis present

## 2020-11-07 DIAGNOSIS — N1831 Chronic kidney disease, stage 3a: Secondary | ICD-10-CM | POA: Diagnosis present

## 2020-11-07 DIAGNOSIS — F0390 Unspecified dementia without behavioral disturbance: Secondary | ICD-10-CM | POA: Diagnosis present

## 2020-11-07 DIAGNOSIS — I361 Nonrheumatic tricuspid (valve) insufficiency: Secondary | ICD-10-CM | POA: Diagnosis not present

## 2020-11-07 DIAGNOSIS — J9600 Acute respiratory failure, unspecified whether with hypoxia or hypercapnia: Secondary | ICD-10-CM | POA: Diagnosis present

## 2020-11-07 DIAGNOSIS — R0609 Other forms of dyspnea: Secondary | ICD-10-CM | POA: Diagnosis not present

## 2020-11-07 DIAGNOSIS — I130 Hypertensive heart and chronic kidney disease with heart failure and stage 1 through stage 4 chronic kidney disease, or unspecified chronic kidney disease: Secondary | ICD-10-CM | POA: Diagnosis present

## 2020-11-07 DIAGNOSIS — J9602 Acute respiratory failure with hypercapnia: Secondary | ICD-10-CM | POA: Diagnosis not present

## 2020-11-07 DIAGNOSIS — R0602 Shortness of breath: Secondary | ICD-10-CM | POA: Diagnosis present

## 2020-11-07 DIAGNOSIS — J811 Chronic pulmonary edema: Secondary | ICD-10-CM | POA: Diagnosis not present

## 2020-11-07 HISTORY — DX: Unsteadiness on feet: R26.81

## 2020-11-07 HISTORY — DX: Chronic kidney disease, stage 3 unspecified: N18.30

## 2020-11-07 HISTORY — DX: Permanent atrial fibrillation: I48.21

## 2020-11-07 HISTORY — DX: Unspecified dementia, unspecified severity, without behavioral disturbance, psychotic disturbance, mood disturbance, and anxiety: F03.90

## 2020-11-07 HISTORY — DX: Unspecified diastolic (congestive) heart failure: I50.30

## 2020-11-07 HISTORY — DX: Unspecified fall, initial encounter: W19.XXXA

## 2020-11-07 LAB — BLOOD GAS, VENOUS
Acid-Base Excess: 2.5 mmol/L — ABNORMAL HIGH (ref 0.0–2.0)
Acid-base deficit: 1.1 mmol/L (ref 0.0–2.0)
Bicarbonate: 28.9 mmol/L — ABNORMAL HIGH (ref 20.0–28.0)
Bicarbonate: 31.3 mmol/L — ABNORMAL HIGH (ref 20.0–28.0)
Delivery systems: POSITIVE
FIO2: 0.45
O2 Saturation: 58.4 %
O2 Saturation: 55.3 %
PEEP: 5 cmH2O
Patient temperature: 37
Patient temperature: 37
Pressure support: 10 cmH2O
pCO2, Ven: 69 mmHg — ABNORMAL HIGH (ref 44.0–60.0)
pCO2, Ven: 65 mmHg — ABNORMAL HIGH (ref 44.0–60.0)
pH, Ven: 7.23 — ABNORMAL LOW (ref 7.250–7.430)
pH, Ven: 7.29 (ref 7.250–7.430)
pO2, Ven: 37 mmHg (ref 32.0–45.0)
pO2, Ven: 33 mmHg (ref 32.0–45.0)

## 2020-11-07 LAB — COMPREHENSIVE METABOLIC PANEL
ALT: 34 U/L (ref 0–44)
AST: 48 U/L — ABNORMAL HIGH (ref 15–41)
Albumin: 4 g/dL (ref 3.5–5.0)
Alkaline Phosphatase: 60 U/L (ref 38–126)
Anion gap: 9 (ref 5–15)
BUN: 20 mg/dL (ref 8–23)
CO2: 26 mmol/L (ref 22–32)
Calcium: 9.2 mg/dL (ref 8.9–10.3)
Chloride: 103 mmol/L (ref 98–111)
Creatinine, Ser: 0.88 mg/dL (ref 0.44–1.00)
GFR, Estimated: >60 mL/min (ref >60)
Glucose, Bld: 210 mg/dL — ABNORMAL HIGH (ref 70–99)
Potassium: 4.1 mmol/L (ref 3.5–5.1)
Sodium: 138 mmol/L (ref 135–145)
Total Bilirubin: 1.2 mg/dL (ref 0.3–1.2)
Total Protein: 7.2 g/dL (ref 6.5–8.1)

## 2020-11-07 LAB — CBC WITH DIFFERENTIAL/PLATELET
Abs Immature Granulocytes: 0.02 10*3/uL (ref 0.00–0.07)
Basophils Absolute: 0.1 10*3/uL (ref 0.0–0.1)
Basophils Relative: 1 %
Eosinophils Absolute: 1 10*3/uL — ABNORMAL HIGH (ref 0.0–0.5)
Eosinophils Relative: 11 %
HCT: 50.2 % — ABNORMAL HIGH (ref 36.0–46.0)
Hemoglobin: 16.5 g/dL — ABNORMAL HIGH (ref 12.0–15.0)
Immature Granulocytes: 0 %
Lymphocytes Relative: 49 %
Lymphs Abs: 4.3 10*3/uL — ABNORMAL HIGH (ref 0.7–4.0)
MCH: 31.5 pg (ref 26.0–34.0)
MCHC: 32.9 g/dL (ref 30.0–36.0)
MCV: 95.8 fL (ref 80.0–100.0)
Monocytes Absolute: 0.6 10*3/uL (ref 0.1–1.0)
Monocytes Relative: 6 %
Neutro Abs: 3 10*3/uL (ref 1.7–7.7)
Neutrophils Relative %: 33 %
Platelets: 193 10*3/uL (ref 150–400)
RBC: 5.24 MIL/uL — ABNORMAL HIGH (ref 3.87–5.11)
RDW: 14 % (ref 11.5–15.5)
WBC: 9 10*3/uL (ref 4.0–10.5)
nRBC: 0 % (ref 0.0–0.2)

## 2020-11-07 LAB — RESP PANEL BY RT-PCR (FLU A&B, COVID) ARPGX2
Influenza A by PCR: NEGATIVE
Influenza B by PCR: NEGATIVE
SARS Coronavirus 2 by RT PCR: NEGATIVE

## 2020-11-07 LAB — PROCALCITONIN: Procalcitonin: <0.1 ng/mL

## 2020-11-07 LAB — HIV ANTIBODY (ROUTINE TESTING W REFLEX): HIV Screen 4th Generation wRfx: NONREACTIVE

## 2020-11-07 LAB — TROPONIN I (HIGH SENSITIVITY)
Troponin I (High Sensitivity): 22 ng/L — ABNORMAL HIGH (ref <18)
Troponin I (High Sensitivity): 168 ng/L (ref <18)
Troponin I (High Sensitivity): 474 ng/L (ref <18)
Troponin I (High Sensitivity): 571 ng/L (ref <18)

## 2020-11-07 LAB — BRAIN NATRIURETIC PEPTIDE: B Natriuretic Peptide: 374.5 pg/mL — ABNORMAL HIGH (ref 0.0–100.0)

## 2020-11-07 MED ORDER — LISINOPRIL-HYDROCHLOROTHIAZIDE 20-12.5 MG PO TABS: 1.0000 | ORAL_TABLET | Freq: Every day | ORAL | Status: DC

## 2020-11-07 MED ORDER — LISINOPRIL 20 MG PO TABS
20.0000 mg | ORAL_TABLET | Freq: Every day | ORAL | Status: DC
  Administered 2020-11-07: 20 mg via ORAL
  Filled 2020-11-07: qty 2

## 2020-11-07 MED ORDER — IPRATROPIUM-ALBUTEROL 0.5-2.5 (3) MG/3ML IN SOLN
3.0000 mL | Freq: Four times a day (QID) | RESPIRATORY_TRACT | Status: DC
  Administered 2020-11-07 (×2): 3 mL via RESPIRATORY_TRACT
  Filled 2020-11-07 (×2): qty 3

## 2020-11-07 MED ORDER — PREDNISONE 20 MG PO TABS: 40.0000 mg | ORAL_TABLET | Freq: Every day | ORAL | Status: DC

## 2020-11-07 MED ORDER — MAGNESIUM SULFATE 2 GM/50ML IV SOLN: 2.0000 g | Freq: Once | INTRAVENOUS | Status: AC

## 2020-11-07 MED ORDER — PANTOPRAZOLE SODIUM 40 MG PO TBEC: 40.0000 mg | DELAYED_RELEASE_TABLET | Freq: Every day | ORAL | Status: DC

## 2020-11-07 MED ORDER — SODIUM CHLORIDE 0.9 % IV SOLN: 250.0000 mL | INTRAVENOUS | Status: DC | PRN

## 2020-11-07 MED ORDER — IPRATROPIUM-ALBUTEROL 0.5-2.5 (3) MG/3ML IN SOLN
RESPIRATORY_TRACT | Status: AC
  Administered 2020-11-07: 3 mL via RESPIRATORY_TRACT
  Filled 2020-11-07: qty 9

## 2020-11-07 MED ORDER — SODIUM CHLORIDE 0.9% FLUSH
3.0000 mL | Freq: Two times a day (BID) | INTRAVENOUS | Status: DC
  Administered 2020-11-07 – 2020-11-09 (×5): 3 mL via INTRAVENOUS

## 2020-11-07 MED ORDER — METHYLPREDNISOLONE SODIUM SUCC 125 MG IJ SOLR
INTRAMUSCULAR | Status: AC
  Administered 2020-11-07: 125 mg via INTRAVENOUS
  Filled 2020-11-07: qty 2

## 2020-11-07 MED ORDER — IPRATROPIUM-ALBUTEROL 0.5-2.5 (3) MG/3ML IN SOLN
3.0000 mL | Freq: Once | RESPIRATORY_TRACT | Status: AC
  Administered 2020-11-07: 3 mL via RESPIRATORY_TRACT

## 2020-11-07 MED ORDER — IPRATROPIUM-ALBUTEROL 0.5-2.5 (3) MG/3ML IN SOLN
3.0000 mL | Freq: Four times a day (QID) | RESPIRATORY_TRACT | Status: DC
  Administered 2020-11-07 – 2020-11-08 (×4): 3 mL via RESPIRATORY_TRACT
  Filled 2020-11-07 (×4): qty 3

## 2020-11-07 MED ORDER — METHYLPREDNISOLONE SODIUM SUCC 40 MG IJ SOLR
40.0000 mg | Freq: Two times a day (BID) | INTRAMUSCULAR | Status: AC
  Administered 2020-11-07 – 2020-11-08 (×2): 40 mg via INTRAVENOUS
  Filled 2020-11-07 (×2): qty 1

## 2020-11-07 MED ORDER — ALBUTEROL SULFATE (2.5 MG/3ML) 0.083% IN NEBU: 2.5000 mg | INHALATION_SOLUTION | RESPIRATORY_TRACT | Status: DC | PRN

## 2020-11-07 MED ORDER — HYDROCHLOROTHIAZIDE 12.5 MG PO CAPS
12.5000 mg | ORAL_CAPSULE | Freq: Every day | ORAL | Status: DC
  Administered 2020-11-07: 12.5 mg via ORAL
  Filled 2020-11-07: qty 1

## 2020-11-07 MED ORDER — MAGNESIUM SULFATE 2 GM/50ML IV SOLN
INTRAVENOUS | Status: AC
  Administered 2020-11-07: 2 g via INTRAVENOUS
  Filled 2020-11-07: qty 50

## 2020-11-07 MED ORDER — MIRTAZAPINE 15 MG PO TABS: 7.5000 mg | ORAL_TABLET | Freq: Every day | ORAL | Status: DC

## 2020-11-07 MED ORDER — SODIUM CHLORIDE 0.9% FLUSH: 3.0000 mL | INTRAVENOUS | Status: DC | PRN

## 2020-11-07 MED ORDER — APIXABAN 2.5 MG PO TABS: 2.5000 mg | ORAL_TABLET | Freq: Two times a day (BID) | ORAL | Status: DC

## 2020-11-07 MED ORDER — METHYLPREDNISOLONE SODIUM SUCC 125 MG IJ SOLR: 125.0000 mg | Freq: Once | INTRAMUSCULAR | Status: AC

## 2020-11-07 MED ORDER — ENOXAPARIN SODIUM 40 MG/0.4ML IJ SOSY
40.0000 mg | PREFILLED_SYRINGE | INTRAMUSCULAR | Status: DC
  Administered 2020-11-07 – 2020-11-10 (×4): 40 mg via SUBCUTANEOUS
  Filled 2020-11-07 (×4): qty 0.4

## 2020-11-07 NOTE — H&P (Addendum)
History and Physical    Bridget [name] MWU:[id number] DOB: 1926/01/18 DOA: 11/07/2020  PCP: Glori Bickers, MD   Patient coming from: Home  I have personally briefly reviewed patient's old medical records in [facility]  Chief Complaint: Shortness of breath   Most of the history was obtained from patient's daughter Dolores Hoose who was at the bedside  HPI: Bridget [name] is a 87 y.o. female with medical history significant for COPD, A. fib, chronic kidney disease and hypertension who was brought into the ER by her daughters for evaluation of shortness of breath. Her daughter states that the patient's birthday was yesterday but she did not feel well and declined going out for dinner with her family.  Overnight she developed shortness of breath which worsened in the early hours of the morning and so she woke her daughter up because she could not breathe.  Patient received multiple nebulizer treatments at home without any improvement in her symptoms and per her daughter she had room air pulse oximetry of 81% which is unusual for her. At baseline her pulse oximetry is anywhere between 94 and 96% on room air. She admits to having an occasional cough which is nonproductive but denies having any fever, no chills, no leg swelling, no history of any recent sick contacts.   She has no chest pain, no nausea, no vomiting, no changes in her bowel habits, no urinary symptoms, no dizziness, no lightheadedness. VBG 7.2 3/69/37/20 8.9/58.4 Sodium 138, potassium 4.1, chloride 103, bicarb 26, glucose 210, BUN 20, creatinine 0.8, calcium 9.2, alkaline phosphatase 60, albumin 4.0, AST 48, ALT 34, total protein 7.2, BNP 374, troponin 22, procalcitonin less than 0.10, white count 9.0, hemoglobin 16.5, hematocrit 50.2, MCV 95.8, RDW 14.0, platelet count 193 Respiratory viral panel is negative Chest x-ray reviewed by me shows no evidence of acute cardiopulmonary disease  ED Course: Patient is a 87 year old  female with a history of COPD who presents to the ER for evaluation of worsening shortness of breath associated with wheezing with no improvement following use of home nebulizer.  She had room air pulse oximetry of 81% at home. She was placed on BiPAP in the ER due to increased work of breathing and respiratory distress.  Venous blood gas showed evidence of uncompensated hypercapnic respiratory failure. She had an episode of nonsustained ventricular tachycardia and received a dose of IV magnesium. Patient will be admitted to the hospital for further evaluation.   Review of Systems: As per HPI otherwise all other systems reviewed and negative.    Past Medical History:  Diagnosis Date  . Asthma   . COPD (chronic obstructive pulmonary disease) (HCC)   . Hypertension     Past Surgical History:  Procedure Laterality Date  . APPENDECTOMY    . BREAST SURGERY    . HERNIA REPAIR       reports that she has quit smoking. She has never used smokeless tobacco. She reports that she does not drink alcohol and does not use drugs.  No Known Allergies  Family History  Problem Relation Age of Onset  . Immunocompromised Neg Hx       Prior to Admission medications   Medication Sig Start Date End Date Taking? Authorizing Provider  albuterol (PROVENTIL) (2.5 MG/3ML) 0.083% nebulizer solution Take 3 mLs (2.5 mg total) by nebulization every 4 (four) hours as needed for wheezing or shortness of breath. 08/31/20 08/31/21  Ward, Baxter Hire N, DO  ELIQUIS 2.5 MG TABS tablet Take 2.5  mg by mouth 2 (two) times daily. 08/26/20   [provider]  furosemide (LASIX) 20 MG tablet Take 1 tablet (20 mg total) by mouth daily. 09/04/20 10/04/20  Charise KillianWilliams, Jamiese M, MD  ipratropium (ATROVENT) 0.02 % nebulizer solution Take 3 mLs by nebulization 4 (four) times daily. 11/04/20   [provider]  ipratropium-albuterol (DUONEB) 0.5-2.5 (3) MG/3ML SOLN Take 3 mLs by nebulization 4 (four) times daily. 02/24/20    [provider]  lisinopril-hydrochlorothiazide (ZESTORETIC) 20-12.5 MG tablet Take 1 tablet by mouth daily. 06/15/20   [provider]  mirtazapine (REMERON) 7.5 MG tablet Take 7.5 mg by mouth at bedtime. 08/26/20   [provider]  omeprazole (PRILOSEC) 20 MG capsule Take 20 mg by mouth daily. 07/29/20   [provider]    Physical Exam: Vitals:   11/07/20 0630 11/07/20 0640 11/07/20 0700 11/07/20 0730  BP:  108/82 103/61 111/65  Pulse: (!) 55 84 (!) 105 (!) 104  Resp: 18 16 20 17   Temp:      TempSrc:      SpO2: 100% 100% 100% 100%  Weight:      Height:         Vitals:   11/07/20 0630 11/07/20 0640 11/07/20 0700 11/07/20 0730  BP:  108/82 103/61 111/65  Pulse: (!) 55 84 (!) 105 (!) 104  Resp: 18 16 20 17   Temp:      TempSrc:      SpO2: 100% 100% 100% 100%  Weight:      Height:          Constitutional: Alert and oriented x 3 .  Appears comfortable on BiPAP HEENT:      Head: Normocephalic and atraumatic.         Eyes: PERLA, EOMI, Conjunctivae are normal. Sclera is non-icteric.       Mouth/Throat: Mucous membranes are moist.       Neck: Supple with no signs of meningismus. Cardiovascular:  Irregularly irregular. No murmurs, gallops, or rubs. 2+ symmetrical distal pulses are present . No JVD.  Trace LE edema Respiratory:  Tachypnea. diffuse wheezes in both lung fields. No crackles, or rhonchi.  Gastrointestinal: Soft, non tender, and non distended with positive bowel sounds.  Genitourinary: No CVA tenderness. Musculoskeletal: Nontender with normal range of motion in all extremities. No cyanosis, or erythema of extremities. Neurologic:  Face is symmetric. Moving all extremities. No gross focal neurologic deficits . Skin: Skin is warm, dry.  Xerosis on lower extremities Psychiatric: Mood and affect are normal   Labs on Admission: I have personally reviewed following labs and imaging studies  CBC: Recent Labs  Lab 11/07/20 0620  WBC  9.0  NEUTROABS 3.0  HGB 16.5*  HCT 50.2*  MCV 95.8  PLT 193   Basic Metabolic Panel: Recent Labs  Lab 11/07/20 0620  NA 138  K 4.1  CL 103  CO2 26  GLUCOSE 210*  BUN 20  CREATININE 0.88  CALCIUM 9.2   GFR: Estimated Creatinine Clearance: 34.5 mL/min (by C-G formula based on SCr of 0.88 mg/dL). Liver Function Tests: Recent Labs  Lab 11/07/20 0620  AST 48*  ALT 34  ALKPHOS 60  BILITOT 1.2  PROT 7.2  ALBUMIN 4.0   No results for input(s): LIPASE, AMYLASE in the last 168 hours. No results for input(s): AMMONIA in the last 168 hours. Coagulation Profile: No results for input(s): INR, PROTIME in the last 168 hours. Cardiac Enzymes: No results for input(s): CKTOTAL, CKMB, CKMBINDEX, TROPONINI  in the last 168 hours. BNP (last 3 results) No results for input(s): PROBNP in the last 8760 hours. HbA1C: No results for input(s): HGBA1C in the last 72 hours. CBG: No results for input(s): GLUCAP in the last 168 hours. Lipid Profile: No results for input(s): CHOL, HDL, LDLCALC, TRIG, CHOLHDL, LDLDIRECT in the last 72 hours. Thyroid Function Tests: No results for input(s): TSH, T4TOTAL, FREET4, T3FREE, THYROIDAB in the last 72 hours. Anemia Panel: No results for input(s): VITAMINB12, FOLATE, FERRITIN, TIBC, IRON, RETICCTPCT in the last 72 hours. Urine analysis:    Component Value Date/Time   COLORURINE YELLOW 07/05/2020 1232   APPEARANCEUR CLOUDY (A) 07/05/2020 1232   LABSPEC 1.030 07/05/2020 1232   PHURINE 5.0 07/05/2020 1232   GLUCOSEU NEGATIVE 07/05/2020 1232   HGBUR SMALL (A) 07/05/2020 1232   BILIRUBINUR NEGATIVE 07/05/2020 1232   KETONESUR NEGATIVE 07/05/2020 1232   PROTEINUR >300 (A) 07/05/2020 1232   NITRITE NEGATIVE 07/05/2020 1232   LEUKOCYTESUR SMALL (A) 07/05/2020 1232    Radiological Exams on Admission: DG Chest Portable 1 View  Result Date: 11/07/2020 CLINICAL DATA:  Shortness of breath EXAM: PORTABLE CHEST 1 VIEW COMPARISON:  08/31/2020 FINDINGS:  Defibrillator pads overlying the mediastinum and left lower hemithorax. Lungs are essentially clear.  No pleural effusion or pneumothorax. The heart is normal in size. IMPRESSION: No evidence of acute cardiopulmonary disease. Electronically Signed   By: Charline Bills M.D.   On: 11/07/2020 06:48     Assessment/Plan Principal Problem:   Acute respiratory failure (HCC) Active Problems:   Atrial fibrillation, chronic (HCC)   HTN (hypertension)   COPD with acute exacerbation (HCC)   CKD (chronic kidney disease), stage III (HCC)   Elevated troponin      Acute hypercapnic, hypoxic respiratory failure Secondary to COPD exacerbation Patient presented to the ER for evaluation of worsening shortness of breath associated with wheezing. She had room air pulse oximetry of 81% and does not use home oxygen Venous blood gas showed uncompensated respiratory acidosis and patient was placed on BiPAP due to increased work of breathing and improve oxygenation Repeat venous blood gas shows improvement but not adequately compensated. Continue BiPAP    COPD with acute exacerbation Patient has a history of COPD and presents to the ER for evaluation of worsening shortness of breath from her baseline associated with diffuse wheezing and a nonproductive cough. We will place patient on scheduled and as needed bronchodilator therapy Place patient on systemic steroids She will need to be assessed for home oxygen need prior to discharge    Chronic atrial fibrillation Patient is rate controlled Patient is not on long term anticoagulation due to high risk for falls   Hypertension with stage III chronic kidney disease Continue lisinopril and hydrochlorothiazide     Elevated troponin Most likely secondary to demand ischemia from respiratory distress We will cycle cardiac enzymes       DVT prophylaxis: Apixaban Code Status: full code Family Communication: Greater than 50% of time  spent discussing patient's condition and plan of care with her and her daughter at the bedside.  All questions and concerns have been addressed.  She lists her daughter Dolores Hoose as her healthcare power of attorney.  CODE STATUS was discussed and she is a full code. Disposition Plan: Back to previous home environment Consults called: none Status: At the time of admission, it appears that the appropriate admission status for this patient is inpatient. This is judged to be reasonable and necessary in order to provide  the required intensity of service to ensure the patient's safety given the presenting symptoms, physical exam findings and initial radiographic and laboratory data in the context of their comorbid conditions. Patient requires inpatient status due to high intensity of service, high risk for further deterioration and high frequency of surveillance required.    Lucile Shutters MD [facility]     11/07/2020, 9:10 AM

## 2020-11-07 NOTE — ED Notes (Signed)
OT at bedside. 

## 2020-11-07 NOTE — ED Notes (Signed)
Pt daughter at bedside. MD aware.

## 2020-11-07 NOTE — Plan of Care (Signed)

## 2020-11-07 NOTE — ED Provider Notes (Signed)
[facility] Emergency Department Provider Note  ____________________________________________  Time seen: Approximately 6:27 AM  I have reviewed the triage vital signs and the nursing notes.   HISTORY  Chief Complaint Shortness of Breath   HPI Bridget [name] is a 87 y.o. female with a history of asthma, COPD, hypertension, A. fib, chronic kidney disease who presents for evaluation of shortness of breath.  Patient reports her shortness of breath has been getting progressively worse over the last 2 days and became severe yesterday evening.  Woke up this morning wheezing pretty bad.  Patient diaphoretic but denies changes in her chronic cough, fever, chest pain.  No personal or family history of PE or DVT, no recent travel, last hospitalization was in March for COPD exacerbation, no leg pain or swelling, no hemoptysis or exogenous hormones.  Patient reports the symptoms feel like her COPD.   Past Medical History:  Diagnosis Date  . Asthma   . COPD (chronic obstructive pulmonary disease) (HCC)   . Hypertension     Patient Active Problem List   Diagnosis Date Noted  . COPD with acute exacerbation (HCC) 08/31/2020  . Atrial fibrillation, chronic (HCC) 08/31/2020  . Acute CHF (congestive heart failure) (HCC) 08/31/2020  . CKD (chronic kidney disease), stage III (HCC) 08/31/2020  . HTN (hypertension) 07/06/2020  . Acute lower UTI 07/06/2020  . Acute respiratory disease due to COVID-19 virus 07/05/2020    Past Surgical History:  Procedure Laterality Date  . APPENDECTOMY    . BREAST SURGERY    . HERNIA REPAIR      Prior to Admission medications   Medication Sig Start Date End Date Taking? Authorizing Provider  albuterol (PROVENTIL) (2.5 MG/3ML) 0.083% nebulizer solution Take 3 mLs (2.5 mg total) by nebulization every 4 (four) hours as needed for wheezing or shortness of breath. 08/31/20 08/31/21  Ward, Layla Maw, DO  furosemide (LASIX) 20 MG tablet Take 1  tablet (20 mg total) by mouth daily. 09/04/20 10/04/20  Charise Killian, MD  ipratropium-albuterol (DUONEB) 0.5-2.5 (3) MG/3ML SOLN Take 3 mLs by nebulization 4 (four) times daily. 02/24/20   [provider]  lisinopril-hydrochlorothiazide (ZESTORETIC) 20-12.5 MG tablet Take 1 tablet by mouth daily. 06/15/20   [provider]    Allergies Patient has no known allergies.  Family History  Problem Relation Age of Onset  . Immunocompromised Neg Hx     Social History Social History   Tobacco Use  . Smoking status: Former Games developer  . Smokeless tobacco: Never Used  Substance Use Topics  . Alcohol use: Never  . Drug use: Never    Review of Systems  Constitutional: Negative for fever. Eyes: Negative for visual changes. ENT: Negative for sore throat. Neck: No neck pain  Cardiovascular: Negative for chest pain. Respiratory: + shortness of breath, wheezing Gastrointestinal: Negative for abdominal pain, vomiting or diarrhea. Genitourinary: Negative for dysuria. Musculoskeletal: Negative for back pain. Skin: Negative for rash. Neurological: Negative for headaches, weakness or numbness. Psych: No SI or HI  ____________________________________________   PHYSICAL EXAM:  VITAL SIGNS: ED Triage Vitals  Enc Vitals Group     BP 11/07/20 0618 (!) 125/105     Pulse Rate 11/07/20 0618 (!) 126     Resp 11/07/20 0618 (!) 23     Temp 11/07/20 0618 98.9 F (37.2 C)     Temp Source 11/07/20 0618 Oral     SpO2 11/07/20 0618 (!) 85 %     Weight 11/07/20 0619 125  lb (56.7 kg)     Height 11/07/20 0619 5\' 4"  (1.626 m)     Head Circumference --      Peak Flow --      Pain Score 11/07/20 0619 0     Pain Loc --      Pain Edu? --      Excl. in GC? --     Constitutional: Alert and oriented, moderate respiratory distress.  HEENT:      Head: Normocephalic and atraumatic.         Eyes: Conjunctivae are normal. Sclera is non-icteric.       Mouth/Throat: Mucous membranes are  moist.       Neck: Supple with no signs of meningismus. Cardiovascular: Irregularly irregular rhythm with rate in the upper 90s Respiratory: Tripoding, diffuse wheezing bilaterally, hypoxic to the low 80s on room air Gastrointestinal: Soft, non tender, and non distended. Musculoskeletal:  No edema, cyanosis, or erythema of extremities. Neurologic: Normal speech and language. Face is symmetric. Moving all extremities. No gross focal neurologic deficits are appreciated. Skin: Skin is warm, dry and intact. No rash noted. Psychiatric: Mood and affect are normal. Speech and behavior are normal.  ____________________________________________   LABS (all labs ordered are listed, but only abnormal results are displayed)  Labs Reviewed  BLOOD GAS, VENOUS - Abnormal; Notable for the following components:      Result Value   pH, Ven 7.23 (*)    pCO2, Ven 69 (*)    Bicarbonate 28.9 (*)    All other components within normal limits  CBC WITH DIFFERENTIAL/PLATELET - Abnormal; Notable for the following components:   RBC 5.24 (*)    Hemoglobin 16.5 (*)    HCT 50.2 (*)    Lymphs Abs 4.3 (*)    Eosinophils Absolute 1.0 (*)    All other components within normal limits  RESP PANEL BY RT-PCR (FLU A&B, COVID) ARPGX2  COMPREHENSIVE METABOLIC PANEL  BRAIN NATRIURETIC PEPTIDE  PROCALCITONIN  TROPONIN I (HIGH SENSITIVITY)   ____________________________________________  EKG  ED ECG REPORT I, 11/09/20, the attending physician, personally viewed and interpreted this ECG.  A. fib, rate of 98, no ST elevations or depressions.  Unchanged from prior ____________________________________________  RADIOLOGY  I have personally reviewed the images performed during this visit and I agree with the Radiologist's read.   Interpretation by Radiologist:  DG Chest Portable 1 View  Result Date: 11/07/2020 CLINICAL DATA:  Shortness of breath EXAM: PORTABLE CHEST 1 VIEW COMPARISON:  08/31/2020 FINDINGS:  Defibrillator pads overlying the mediastinum and left lower hemithorax. Lungs are essentially clear.  No pleural effusion or pneumothorax. The heart is normal in size. IMPRESSION: No evidence of acute cardiopulmonary disease. Electronically Signed   By: 09/02/2020 M.D.   On: 11/07/2020 06:48      ____________________________________________   PROCEDURES  Procedure(s) performed:yes .1-3 Lead EKG Interpretation Performed by: 11/09/2020, MD Authorized by: Nita Sickle, MD     Interpretation: non-specific     ECG rate assessment: normal     Rhythm: sinus rhythm     Ectopy: trigeminy     Conduction: normal     Critical Care performed: yes  CRITICAL CARE Performed by: Nita Sickle  ?  Total critical care time: 35 min  Critical care time was exclusive of separately billable procedures and treating other patients.  Critical care was necessary to treat or prevent imminent or life-threatening deterioration.  Critical care was time spent personally by me on the following activities:  development of treatment plan with patient and/or surrogate as well as nursing, discussions with consultants, evaluation of patient's response to treatment, examination of patient, obtaining history from patient or surrogate, ordering and performing treatments and interventions, ordering and review of laboratory studies, ordering and review of radiographic studies, pulse oximetry and re-evaluation of patient's condition.  ____________________________________________   INITIAL IMPRESSION / ASSESSMENT AND PLAN / ED COURSE   87 y.o. female with a history of asthma, COPD, hypertension, A. fib, chronic kidney disease who presents for evaluation of shortness of breath.  Patient arrives in moderate respiratory distress, satting in the low 80s on room air with diffuse wheezing bilaterally, tripoding, tachypneic.  She was placed immediately on BiPAP and started on 3 duo nebs and IV  Solu-Medrol.  Patient noticed to have a couple of runs of V. tach on monitor and was started on 2 g of IV magnesium.  EKG showing A. fib with well-controlled rate.  Differential diagnoses including COPD exacerbation versus bronchitis versus pneumonia versus COVID versus flu versus PE.  Chest x-ray labs are pending.  Anticipate admission.  Old medical records reviewed including patient's records from her recent admission in March for COPD exacerbation and admission in January for COVID-19.  _________________________ 6:51 AM on 11/07/2020 -----------------------------------------  VBG showing signs of CO2 retention with a pH of 7.23 and a PCO2 of 69.  Presentation concerning with acute respiratory failure with hypoxia and hypercapnia.  Patient looks improved on BiPAP.  No leukocytosis.  Procalcitonin is pending.  Remainder of her lab work is pending.  Chest x-ray visualized by me with no acute process, confirmed by radiology.  Care transferred to incoming doctor.     _____________________________________________ Please note:  Patient was evaluated in Emergency Department today for the symptoms described in the history of present illness. Patient was evaluated in the context of the global COVID-19 pandemic, which necessitated consideration that the patient might be at risk for infection with the SARS-CoV-2 virus that causes COVID-19. Institutional protocols and algorithms that pertain to the evaluation of patients at risk for COVID-19 are in a state of rapid change based on information released by regulatory bodies including the CDC and federal and state organizations. These policies and algorithms were followed during the patient's care in the ED.  Some ED evaluations and interventions may be delayed as a result of limited staffing during the pandemic.   [place] Controlled Substance Database was reviewed by me. ____________________________________________   FINAL CLINICAL IMPRESSION(S) / ED  DIAGNOSES   Final diagnoses:  COPD with acute exacerbation (HCC)  Acute respiratory failure with hypoxia and hypercapnia (HCC)      NEW MEDICATIONS STARTED DURING THIS VISIT:  ED Discharge Orders    None       Note:  This document was prepared using Dragon voice recognition software and may include unintentional dictation errors.    Don Perking, [place], MD 11/07/20 [phone]

## 2020-11-07 NOTE — ED Notes (Signed)
Critical trop reported to dr Joylene Igo

## 2020-11-07 NOTE — ED Notes (Signed)
Pt in bed with bipap on. Report received. Pt AO and able to hold conversation. Pt reports breathing much better. Tolerating bipap. Denies further needs. Remains on zoll pads.

## 2020-11-07 NOTE — ED Notes (Signed)
Pt trialed off bipap for breakfast. Resp at bedside. Pt on 2LNC and maintaining at 99%.

## 2020-11-07 NOTE — ED Triage Notes (Signed)
Pt states that she began to get [facility] yesterday, but woke up wheezing today. Pt is diaphoretic. MD in room at time of triage

## 2020-11-07 NOTE — ED Notes (Signed)
Messaged RN bed assigned 

## 2020-11-07 NOTE — Evaluation (Signed)
Occupational Therapy Evaluation Patient Details Name: Bridget [name] MRN: [id number] DOB: [birth year] Today's Date: 11/07/2020    History of Present Illness Pt is a 87 y/o F with PMH: COPD (has home OT PRN, takes 2 neb txs/day), HTN, breast surgery, hernia repair, mild dementia, asthma, COVID (Jan 2022), and dCHF. Pt presented to [facility] ED this date d/t SOB, wheexing and diaphoresis. EKG noted to show Afib. Pt adm for acute on chronic hypoxic respiratory failure.   Clinical Impression   Pt seen for OT evaluation this date in setting of acute hospitalization d/t respiratory failure. Pt lives with daughter in [facility] with 2-3 STE. Pt reports being INDEP/MOD I with ADLs at baseline and having assist from daughter for IADLs. Pt endorses occasional need for transferring into/out of tub as pt's shower is a tub/shower combo. Pt presents this date with decreased fxl activity tolerance, weakness and cardiopulmonary status impacting her ability to safely and efficiently perform ADLs/ADL mobility. Pt requires CGA/MIN A for ADL transfers and fxl mobility with hand held assist, MIN A for seated LB ADLs, SETUP to MIN A for seated UB ADLs d/t low tolerance, MOD A for standing LB ADLs such as clothing mgt over hips after toileting. Pt assisted in transferring to hospital bed as transport presents to take her to the 2nd floor. Pt left with all needs met and O2 tank connected. Pt initially on 4Lnc when OT presented but able to maintain sats with activity on only 2L >90%. OT titrates and notifies transport. Will continue to follow, anticipate pt will require HHOT f/u as well as SUPV and support from her daughter to safely return to home environment.     Follow Up Recommendations  Home health OT;Supervision/Assistance - 24 hour    Equipment Recommendations  3 in 1 bedside commode    Recommendations for Other Services       Precautions / Restrictions Precautions Precautions: Fall Restrictions Weight Bearing  Restrictions: No      Mobility Bed Mobility Overal bed mobility: Needs Assistance Bed Mobility: Supine to Sit;Sit to Supine     Supine to sit: Min guard;Min assist;HOB elevated Sit to supine: Min guard;Min assist;HOB elevated   General bed mobility comments: increased time, HOB elevated    Transfers Overall transfer level: Needs assistance Equipment used: 1 person hand held assist Transfers: Sit to/from [facility] Sit to Stand: Min assist Stand pivot transfers: Min assist       General transfer comment: increased time, requires increased assist from lower surface like commode of MIN/MOD A, but MIN A from stretcher. HHA for steadying    Balance Overall balance assessment: Needs assistance Sitting-balance support: Feet supported Sitting balance-Leahy Scale: Good     Standing balance support: Single extremity supported Standing balance-Leahy Scale: Fair Standing balance comment: requires at least unilateral support to sustain static standing or perform fxl mobility short distances.                           ADL either performed or assessed with clinical judgement   ADL Overall ADL's : Needs assistance/impaired                                       General ADL Comments: CGA/MIN A for ADL transfers and fxl mobility with hand held assist, MIN A for seated LB ADLs, SETUP to MIN A for seated  UB ADLs d/t low tolerance, MOD A for standing LB ADLs such as clothing mgt over hips after toileting.     Vision Patient Visual Report: No change from baseline       Perception     Praxis      Pertinent Vitals/Pain Pain Assessment: No/denies pain     Hand Dominance Right   Extremity/Trunk Assessment Upper Extremity Assessment Upper Extremity Assessment: Overall WFL for tasks assessed;Generalized weakness (ROM WFL, MMT grossly ~4-/5 (functional given age))   Lower Extremity Assessment Lower Extremity Assessment: Defer to PT  evaluation;Overall [facility] for tasks assessed;Generalized weakness   Cervical / Trunk Assessment Cervical / Trunk Assessment: Kyphotic   Communication Communication Communication: No difficulties   Cognition Arousal/Alertness: Awake/alert Behavior During Therapy: WFL for tasks assessed/performed Overall Cognitive Status: Within Functional Limits for tasks assessed                                 General Comments: oriented to place and some aspects of situation, appropriate with all commands, very pleasant and kind.   General Comments       Exercises Other Exercises Other Exercises: OT ed re: role, safety, importance of OOB activity. Pt with good understanding.   Shoulder Instructions      Home Living Family/patient expects to be discharged to:: Private residence Living Arrangements: Children (pt has her own home, but has been staying with daughter-[name] for past month or so. Lattie Haw works from home.) Available Help at Discharge: Family;Available 24 hours/day Type of Home: House Home Access: Stairs to enter [employer] of Steps: 2-3 steps Entrance Stairs-Rails: Left Home Layout: One level               Home Equipment: Walker - 2 wheels;Cane - single point;Shower seat;Bedside commode;Grab bars - tub/shower          Prior Functioning/Environment Level of Independence: Needs assistance  Gait / Transfers Assistance Needed: has cane and walker, but reports rarely using, states she primarily amb in the home w/ no AD ADL's / Homemaking Assistance Needed: Pt reports she is able to perform all basic self care, but that Lattie Haw helps with Laundry, cooking, cleaning and transportation. Pt no longer drives.   Comments: reports only one fall 5 years ago in which she broke her arm        OT Problem List: Decreased strength;Decreased activity tolerance;Decreased knowledge of use of DME or AE;Cardiopulmonary status limiting activity      OT  Treatment/Interventions: Self-care/ADL training;DME and/or AE instruction;Therapeutic activities;Therapeutic exercise;Energy conservation;Patient/family education    OT Goals(Current goals can be found in the care plan section) Acute Rehab OT Goals Patient Stated Goal: to get stronger and breathing better OT Goal Formulation: With patient Time For Goal Achievement: 11/21/20 Potential to Achieve Goals: Good ADL Goals Pt Will Perform Lower Body Bathing: with supervision;sitting/lateral leans (with modified technique in sitting for EC with AE PRN) Pt Will Perform Lower Body Dressing: with supervision;sitting/lateral leans (with AE PRN for EC) Pt Will Transfer to Toilet: ambulating;grab bars (with LRAD to/from restroom, self-initiating standing or sitting rest breaks as needed for energy conservation) Pt Will Perform Toileting - Clothing Manipulation and hygiene: with supervision;sitting/lateral leans (sit/lateral lean technique for EC strategies) Pt/caregiver will Perform Home Exercise Program: Increased strength;Both right and left upper extremity;With Supervision  OT Frequency: Min 1X/week   Barriers to D/C:            Co-evaluation  AM-PAC OT "6 Clicks" Daily Activity     Outcome Measure Help from another person eating meals?: None Help from another person taking care of personal grooming?: A Little Help from another person toileting, which includes using toliet, bedpan, or urinal?: A Lot Help from another person bathing (including washing, rinsing, drying)?: A Lot Help from another person to put on and taking off regular upper body clothing?: A Little Help from another person to put on and taking off regular lower body clothing?: A Little 6 Click Score: 17   End of Session Equipment Utilized During Treatment: Gait belt;Oxygen Nurse Communication: Mobility status  Activity Tolerance: Patient tolerated treatment well Patient left: Other (comment) (transferred to  hospital bed as transport presented to move pt form ED to room)  OT Visit Diagnosis: Unsteadiness on feet (R26.81);Muscle weakness (generalized) (M62.81)                Time: 7939-0300 OT Time Calculation (min): 23 min Charges:  OT General Charges $OT Visit: 1 Visit OT Evaluation $OT Eval Moderate Complexity: 1 Mod OT Treatments $Self Care/Home Management : 8-22 mins  Gerrianne Scale, MS, OTR/L ascom [phone] 11/07/20, 6:34 PM

## 2020-11-07 NOTE — Consult Note (Signed)
Cardiology Consult    Patient ID: RYLA CAUTHON MRN: [id number], DOB/AGE: [birth year]   Admit date: 11/07/2020 Date of Consult: 11/07/2020  Primary Physician: Glori Bickers, MD Primary Cardiologist: None Requesting Provider: Arvid Right, MD  Patient Profile    Bridget [name] is a 87 y.o. female with a history of permanent Afib, HTN, COPD/Asthma, HFpEF, and mild dementia who is being seen today for the evaluation of elevated HsTroponin in the setting of resp failure/AECOPD at the request of Dr. Joylene Igo.  Past Medical History   Past Medical History:  Diagnosis Date  . (HFpEF) heart failure with preserved ejection fraction (HCC)    a. 08/2020 Echo: EF 60-65%, no rwma, Gr1 DD, nl RV fxn. Mildly dil RA. Mild MR/AI.  Marland Kitchen Asthma   . CKD (chronic kidney disease), stage III (HCC)   . COPD (chronic obstructive pulmonary disease) (HCC)   . Falls   . Hypertension   . Mild Dementia (HCC)   . Permanent atrial fibrillation (HCC)    a. CHA2DS2VASc = 5-->no OAC 2/2 unsteady gait/falls.  Waymon Budge gait     Past Surgical History:  Procedure Laterality Date  . APPENDECTOMY    . BREAST SURGERY    . HERNIA REPAIR       Allergies  No Known Allergies  History of Present Illness    87 year old female with a history of permanent atrial fibrillation, hypertension, COPD/asthma, HFpEF, and mild dementia.  She lives in [place], [place] but spends a lot of time with her daughter here in [place].  She does not have a cardiologist and says that her primary care provider has been managing her A. fib.  Notes indicate that she is not on anticoagulation secondary to history of unsteady gait and falls however, home medications include Eliquis 2.5 mg twice daily.  She has been hospitalized twice at [facility] this year, initially in January with respiratory failure, COVID, and UTI.  In March, she was admitted with acute on chronic diastolic heart failure requiring diuresis.  EF was 60 to 65% with  grade 1 diastolic dysfunction by echo.  Mild MR was also noted.  Patient says that she has been stable since her March hospitalization.  She is not particularly active.  She has been staying with her daughter for several weeks.  On [birth year] her birthday, she did not feel well enough to go out to dinner with her family.  She subsequently developed dyspnea around 9 PM.  As this progressed overnight, she alerted her daughter.  She was given multiple nebulizers without any improvement in her symptoms she was noted to have a pulse oximetry of 81%.  She was taken to the emergency department where a respiratory panel was negative.  ECG showed A. fib without acute ST or T changes.  Chest x-ray showed no acute cardiopulmonary disease.  She was initially placed on BiPAP but was subsequently able to be weaned off to nasal cannula 2 L/min.  Currently, she has no complaints.  She denies any history of chest pain.  In the setting of above, troponin was initially 22 and rose to 168.  Inpatient Medications    . enoxaparin (LOVENOX) injection  40 mg Subcutaneous Q24H  . lisinopril  20 mg Oral Daily   And  . hydrochlorothiazide  12.5 mg Oral Daily  . ipratropium-albuterol  3 mL Nebulization QID  . methylPREDNISolone (SOLU-MEDROL) injection  40 mg Intravenous Q12H   Followed by  . [START ON 11/08/2020] predniSONE  40 mg  Oral Q breakfast  . sodium chloride flush  3 mL Intravenous Q12H    Family History    Family History  Problem Relation Age of Onset  . Immunocompromised Neg Hx    She indicated that the status of her neg hx is unknown.   Social History    Social History   Socioeconomic History  . Marital status: Widowed    Spouse name: Not on file  . Number of children: Not on file  . Years of education: Not on file  . Highest education level: Not on file  Occupational History  . Not on file  Tobacco Use  . Smoking status: Former Games developer  . Smokeless tobacco: Never Used  Substance and Sexual  Activity  . Alcohol use: Never  . Drug use: Never  . Sexual activity: Not Currently    Birth control/protection: Post-menopausal  Other Topics Concern  . Not on file  Social History Narrative  . Not on file   Social Determinants of Health   Financial Resource Strain: Not on file  Food Insecurity: Not on file  Transportation Needs: Not on file  Physical Activity: Not on file  Stress: Not on file  Social Connections: Not on file  Intimate Partner Violence: Not on file  Says her home is in [place] but has been staying in [place] with her daughter.  Review of Systems    General:  No chills, fever, night sweats or weight changes.  Cardiovascular:  No chest pain, +++ dyspnea on exertion, no edema, orthopnea, palpitations, paroxysmal nocturnal dyspnea. Dermatological: No rash, lesions/masses Respiratory: No cough, +++ dyspnea Urologic: No hematuria, dysuria Abdominal:   No nausea, vomiting, diarrhea, bright red blood per rectum, melena, or hematemesis Neurologic:  No visual changes, wkns, changes in mental status. All other systems reviewed and are otherwise negative except as noted above.  Physical Exam    Blood pressure 118/74, pulse 95, temperature 98.9 F (37.2 C), temperature source Oral, resp. rate (!) 33, height 5\' 4"  (1.626 m), weight 56.7 kg, SpO2 93 %.  General: Pleasant, NAD Psych: Normal affect. Neuro: Alert and oriented X 3. Moves all extremities spontaneously. HEENT: Normal  Neck: Supple without bruits or JVD. Lungs:  Resp regular and unlabored, diminished breath sounds with scattered rhonchi. Heart: Irregularly irregular, no s3, s4, or murmurs. Abdomen: Soft, non-tender, non-distended, BS + x 4.  Extremities: No clubbing, cyanosis or edema. DP/PT1+, Radials 2+ and equal bilaterally.  Labs    Cardiac Enzymes Recent Labs  Lab 11/07/20 0620 11/07/20 0825  TROPONINIHS 22* 168*      Lab Results  Component Value Date   WBC 9.0 11/07/2020   HGB 16.5  (H) 11/07/2020   HCT 50.2 (H) 11/07/2020   MCV 95.8 11/07/2020   PLT 193 11/07/2020    Recent Labs  Lab 11/07/20 0620  NA 138  K 4.1  CL 103  CO2 26  BUN 20  CREATININE 0.88  CALCIUM 9.2  PROT 7.2  BILITOT 1.2  ALKPHOS 60  ALT 34  AST 48*  GLUCOSE 210*    Radiology Studies    DG Chest Portable 1 View  Result Date: 11/07/2020 CLINICAL DATA:  Shortness of breath EXAM: PORTABLE CHEST 1 VIEW COMPARISON:  08/31/2020 FINDINGS: Defibrillator pads overlying the mediastinum and left lower hemithorax. Lungs are essentially clear.  No pleural effusion or pneumothorax. The heart is normal in size. IMPRESSION: No evidence of acute cardiopulmonary disease. Electronically Signed   By: 09/02/2020 M.D.   On:  11/07/2020 06:48    ECG & Cardiac Imaging    Afib, 109, RVH, baseline artifact - personally reviewed.  Assessment & Plan    1.  Acute on chronic hypoxic respiratory failure: Patient presented to the ED early this morning after developing dyspnea overnight.  Patient says symptoms started at 9 PM last night the notes indicate that it was more like the early morning hours.  She did not respond to nebulizers at home and desaturated to 81%.  She did require BiPAP on arrival here but has since been weaned to nasal cannula.  Chest x-ray without acute cardiopulmonary disease.  Viral panel negative.  She does have diminished breath sounds with scattered rhonchi on exam and will be managed with steroids by the internal medicine team.  She appears euvolemic.  2.  Demand ischemia: In the setting of #1, patient's troponin was initially elevated at 22 and subsequently rose to 168.  She has not been having any chest pain.  ECG without acute ST or T changes.  Echo in March of this year showed normal LV function.  Suspect mild troponin elevation represents demand ischemia.  Given advanced age, mild dementia, and frailty, I prefer an initial conservative approach.  Hold off on beta-blocker in the  setting of respiratory failure requiring steroids.  It is unclear if she is currently on Eliquis or not at home.  If not, would add aspirin.  Follow-up lipids.  3.  Chronic heart failure with preserved ejection fraction: She appears euvolemic on examination.  Blood pressure stable.  Heart rates could be better, she has been trending in the 90s to low 100s.    4.  Permanent atrial fibrillation: As noted above, heart rates trending in the 90s to 100s.  She is not on any rate controlling agents at home and is a poor candidate for beta-blocker initiation in the setting of acute respiratory failure and COPD at this time.  We will add low-dose diltiazem.  Regarding anticoagulation, our notes indicate that she was not a candidate secondary to falls.  Home medicines list Eliquis 2.5 mg twice daily.  Patient is not sure if she is on this or not.  If she is not, would add aspirin.  Signed, Nicolasa Ducking, NP 11/07/2020, 12:36 PM  For questions or updates, please contact   Please consult [email or web address] for contact info under Cardiology/STEMI.

## 2020-11-08 DIAGNOSIS — I248 Other forms of acute ischemic heart disease: Secondary | ICD-10-CM

## 2020-11-08 DIAGNOSIS — N182 Chronic kidney disease, stage 2 (mild): Secondary | ICD-10-CM

## 2020-11-08 DIAGNOSIS — J811 Chronic pulmonary edema: Secondary | ICD-10-CM

## 2020-11-08 DIAGNOSIS — I5032 Chronic diastolic (congestive) heart failure: Secondary | ICD-10-CM

## 2020-11-08 LAB — BASIC METABOLIC PANEL
Anion gap: 7 (ref 5–15)
BUN: 21 mg/dL (ref 8–23)
CO2: 26 mmol/L (ref 22–32)
Calcium: 9 mg/dL (ref 8.9–10.3)
Chloride: 103 mmol/L (ref 98–111)
Creatinine, Ser: 0.83 mg/dL (ref 0.44–1.00)
GFR, Estimated: >60 mL/min (ref >60)
Glucose, Bld: 132 mg/dL — ABNORMAL HIGH (ref 70–99)
Potassium: 4.4 mmol/L (ref 3.5–5.1)
Sodium: 136 mmol/L (ref 135–145)

## 2020-11-08 LAB — LIPID PANEL
Cholesterol: 144 mg/dL (ref 0–200)
HDL: 49 mg/dL (ref >40)
LDL Cholesterol: 83 mg/dL (ref 0–99)
Total CHOL/HDL Ratio: 2.9 RATIO
Triglycerides: 61 mg/dL (ref <150)
VLDL: 12 mg/dL (ref 0–40)

## 2020-11-08 LAB — CBC
HCT: 43.7 % (ref 36.0–46.0)
Hemoglobin: 14.5 g/dL (ref 12.0–15.0)
MCH: 31 pg (ref 26.0–34.0)
MCHC: 33.2 g/dL (ref 30.0–36.0)
MCV: 93.6 fL (ref 80.0–100.0)
Platelets: 170 10*3/uL (ref 150–400)
RBC: 4.67 MIL/uL (ref 3.87–5.11)
RDW: 14 % (ref 11.5–15.5)
WBC: 10.7 10*3/uL — ABNORMAL HIGH (ref 4.0–10.5)
nRBC: 0 % (ref 0.0–0.2)

## 2020-11-08 LAB — TROPONIN I (HIGH SENSITIVITY): Troponin I (High Sensitivity): 601 ng/L (ref <18)

## 2020-11-08 MED ORDER — ATORVASTATIN CALCIUM 10 MG PO TABS
10.0000 mg | ORAL_TABLET | Freq: Every day | ORAL | Status: DC
  Administered 2020-11-08 – 2020-11-09 (×2): 10 mg via ORAL
  Filled 2020-11-08: qty 1

## 2020-11-08 MED ORDER — DILTIAZEM HCL 30 MG PO TABS
30.0000 mg | ORAL_TABLET | Freq: Four times a day (QID) | ORAL | Status: DC
  Administered 2020-11-08 – 2020-11-09 (×2): 30 mg via ORAL
  Filled 2020-11-08 (×6): qty 1

## 2020-11-08 MED ORDER — ASPIRIN 81 MG PO CHEW
81.0000 mg | CHEWABLE_TABLET | Freq: Every day | ORAL | Status: DC
  Administered 2020-11-09 – 2020-11-10 (×2): 81 mg via ORAL
  Filled 2020-11-08 (×2): qty 1

## 2020-11-08 MED ORDER — IPRATROPIUM-ALBUTEROL 0.5-2.5 (3) MG/3ML IN SOLN
3.0000 mL | Freq: Two times a day (BID) | RESPIRATORY_TRACT | Status: DC
  Administered 2020-11-08 – 2020-11-10 (×4): 3 mL via RESPIRATORY_TRACT
  Filled 2020-11-08 (×5): qty 3

## 2020-11-08 MED ORDER — METHYLPREDNISOLONE SODIUM SUCC 40 MG IJ SOLR
40.0000 mg | Freq: Every day | INTRAMUSCULAR | Status: DC
  Administered 2020-11-08 – 2020-11-09 (×2): 40 mg via INTRAVENOUS
  Filled 2020-11-08 (×2): qty 1

## 2020-11-08 MED ORDER — LISINOPRIL 10 MG PO TABS
5.0000 mg | ORAL_TABLET | Freq: Every day | ORAL | Status: DC
  Administered 2020-11-08: 5 mg via ORAL
  Filled 2020-11-08: qty 1

## 2020-11-08 MED ORDER — MOMETASONE FURO-FORMOTEROL FUM 100-5 MCG/ACT IN AERO
2.0000 | INHALATION_SPRAY | Freq: Two times a day (BID) | RESPIRATORY_TRACT | Status: DC
  Administered 2020-11-08 – 2020-11-10 (×4): 2 via RESPIRATORY_TRACT
  Filled 2020-11-08: qty 8.8

## 2020-11-08 NOTE — Progress Notes (Signed)
Pt given neb tx w/o issue. bipap prn no indication currently, will follow.

## 2020-11-08 NOTE — Plan of Care (Signed)
Continuing with plan of care. 

## 2020-11-08 NOTE — Progress Notes (Signed)
Progress Note  Patient Name: Bridget [name] Date of Encounter: 11/08/2020  Primary Cardiologist: Julien Nordmann, MD   Subjective   Feels that breathing has improved.  No chest pain.  Inpatient Medications    Scheduled Meds: . enoxaparin (LOVENOX) injection  40 mg Subcutaneous Q24H  . ipratropium-albuterol  3 mL Nebulization Q6H  . lisinopril  5 mg Oral Daily  . methylPREDNISolone (SOLU-MEDROL) injection  40 mg Intravenous Daily  . sodium chloride flush  3 mL Intravenous Q12H   Continuous Infusions: . sodium chloride     PRN Meds: sodium chloride, albuterol, sodium chloride flush   Vital Signs    Vitals:   11/08/20 0411 11/08/20 0449 11/08/20 0754 11/08/20 0841  BP:  103/68 90/66   Pulse: 75 86 73 74  Resp: 16 19 18 16   Temp:  97.6 F (36.4 C) 97.9 F (36.6 C)   TempSrc:  Oral Oral   SpO2: 99% 100% 99% 98%  Weight:      Height:        Intake/Output Summary (Last 24 hours) at 11/08/2020 1108 Last data filed at 11/08/2020 1034 Gross per 24 hour  Intake 240 ml  Output 300 ml  Net -60 ml   Filed Weights   11/07/20 0619  Weight: 56.7 kg    Physical Exam   GEN: thin, frail, in no acute distress.  HEENT: Grossly normal.  Neck: Supple, no JVD, carotid bruits, or masses. Cardiac: IR, IR, no murmurs, rubs, or gallops. No clubbing, cyanosis, edema.  Radials 2+, DP/PT 2+ and equal bilaterally.  Respiratory:  Respirations regular and unlabored, rhonchi throughout w/ faint exp wheezing. GI: Soft, nontender, nondistended, BS + x 4. MS: no deformity or atrophy. Skin: warm and dry, no rash. Neuro:  Strength and sensation are intact. Psych: AAOx3.  Normal affect.  Labs    Chemistry Recent Labs  Lab 11/07/20 0620 11/08/20 0501  NA 138 136  K 4.1 4.4  CL 103 103  CO2 26 26  GLUCOSE 210* 132*  BUN 20 21  CREATININE 0.88 0.83  CALCIUM 9.2 9.0  PROT 7.2  --   ALBUMIN 4.0  --   AST 48*  --   ALT 34  --   ALKPHOS 60  --   BILITOT 1.2  --   GFRNONAA >60  >60  ANIONGAP 9 7     Hematology Recent Labs  Lab 11/07/20 0620 11/08/20 0501  WBC 9.0 10.7*  RBC 5.24* 4.67  HGB 16.5* 14.5  HCT 50.2* 43.7  MCV 95.8 93.6  MCH 31.5 31.0  MCHC 32.9 33.2  RDW 14.0 14.0  PLT 193 170    Cardiac Enzymes  Recent Labs  Lab 11/07/20 0620 11/07/20 0825 11/07/20 1203 11/07/20 1624  TROPONINIHS 22* 168* 474* 571*      BNP Recent Labs  Lab 11/07/20 0620  BNP 374.5*    Lipids  Lab Results  Component Value Date   TRIG 59 07/05/2020    Radiology    -------------------  Telemetry    Afib, rare PVCs. 70's to low 100's - Personally Reviewed  Cardiac Studies   2D Echocardiogram 3.21.2022  1. Left ventricular ejection fraction, by estimation, is 60 to 65%. The  left ventricle has normal function. The left ventricle has no regional  wall motion abnormalities. There is mild left ventricular hypertrophy.  Left ventricular diastolic parameters  are consistent with Grade I diastolic dysfunction (impaired relaxation).  2. Right ventricular systolic function is normal. The right ventricular  size is normal.  3. Right atrial size was mildly dilated.  4. The mitral valve is grossly normal. Mild mitral valve regurgitation.  5. The aortic valve was not well visualized. Aortic valve regurgitation  is mild.   Patient Profile     87 y.o. female  with a history of permanent Afib, HTN, COPD/Asthma, HFpEF, and mild dementia, who was admitted 5/28 w/ worsening dyspnea and HsTrop elevation.  Assessment & Plan    1.  Acute on chronic hypoxic respiratory failure/AECOPD:  Admitted 5/28 w/ dyspnea unresponsive to nebs @ home.  Sats in 80's @ home.  Initially required BiPAP here.  CXR w/o acute cardiopulm dzs.  BNP mildly elevated @ 374.  HsTrop up to 571, though no c/o chest pain.  Breathing much improved w/ O2 and steroids.  Still w/ coarse breath sounds and faint exp wheezing.  Rec for IV lasix yesterday - never ordered.  Currently no JVD and otw  appears euvolemic on exam - will hold off on lasix, esp as bp soft.  Mgmt per IM.    2.  Demand Ischemia: In setting of #1.  HsTrop up @ 22  168  474  571. No chest pain.  No prior h/o CAD however, high risk given advanced age and risk factors. Echo in March w/ nl EF.  Limited echo to re-eval pending.  Not likely to be a good candidate for ischemic eval given advanced age/frailty/mild dementia.  I've added ASA and statin.  F/u lipids.  No  blocker w/ active wheezing and AECOPD.  3.  Permanent Afib:  Rates 70's to 90's.  BP soft.  Will d/c acei to make room for dilt, which she never got yesterday.  4.  Chronic HFpEF:  As above, euvolemic this AM w/o JVD/edema.  F/u limited echo.  Adding low dose dilt 30 q6h, if BP allows.  If she tolerates, can consolidate tomorrow.  Signed, Nicolasa Ducking, NP  11/08/2020, 11:08 AM    For questions or updates, please contact   Please consult [email or web address] for contact info under Cardiology/STEMI.

## 2020-11-08 NOTE — Evaluation (Signed)
Physical Therapy Evaluation Patient Details Name: Bridget [name] MRN: [id number] DOB: 1925-09-26 Today's Date: 11/08/2020   History of Present Illness  Pt is a 87 y/o F with PMH: COPD (has home OT PRN, takes 2 neb txs/day), HTN, breast surgery, hernia repair, mild dementia, asthma, COVID (Jan 2022), and dCHF. Pt presented to [facility] ED this date d/t SOB, wheexing and diaphoresis. EKG noted to show Afib. Pt adm for acute on chronic hypoxic respiratory failure.  Clinical Impression  Patient received by PT seated in recliner on RA and eager to participate in PT interventions. Pt noted to have resting SpO2: 95% with HR: 87bpm, and not reported pain. Prior to completing interventions educated patient on the importance of pursed lip breathing to maintain SpO2 while she is completing her functional activities and ambulating. Completed sit<stand transfer with pt requiring VCs to scoot to the edge of the chair and pull her feet back underneath her knees to decrease difficulty with completing sit< stand transfer. Pt able to follow cues appropriately. Upon coming to the standing position pt was reaching for therapist and furniture to stabilize, despite reporting that her PLOF was I with no AD to complete transfers and ambulation.  Educated pt on the importance of utilizing the RW for transfers and ambulation to decrease her risk for falls and improved standing stability. Pt receptive to education and agreeable to use the RW for all transfers and ambulation. Educated pt on how to safely use RW, by staying inside the walker (not letting it get too far out in front of her). Pt able to demonstrate safe utilization of RW with intermittent assistance required to manage walk when pt was changing directions and transferring back to the recliner. Pt's ambulated 20 ft using RW and minA from therapist, SpO2: 85% once pt returned to recliner returning to >90% with 60 second seated rest break and pursed lip breathing. Completed a  second round of ambulation for 43ft using RW and minA, pt reminded to use her PLB and was able to maintain her SpO2 >90%. Completed seated therapeutic exercises including: LAQs x10 BIL, seated alternating marches x20, ankle pumps x20, pillow squeezes x10 while maintaining her SpO2>90%. Pt reports some fatigue following interventions today, but motivated to make improvements in her strength and endurance to improve her stability and I with functional activity. Continue with PT interventions to address balance, LE strenght and endurance deficits.     Follow Up Recommendations Home health PT;Supervision/Assistance - 24 hour    Equipment Recommendations  None recommended by PT (Pt reports having all needed AD at this time)    Recommendations for Other Services       Precautions / Restrictions Precautions Precautions: Fall Restrictions Weight Bearing Restrictions: No      Mobility  Bed Mobility               General bed mobility comments: Pt seated in recliner upon PT arrival to room Patient Response: Cooperative  Transfers Overall transfer level: Needs assistance Equipment used: Rolling walker (2 wheeled) Transfers: Sit to/from Stand Sit to Stand: Min assist         General transfer comment: Pt requires increased time, verbal cues for appropriate sequencing (scooting to edge of chair prior to rising) and appropriate placement of hands on the chair railing to complete sit<>stand transfer safely. Pt grabbing on to therapist and reaching for furniture to stabilize herself in the standing position. Encouraged pt to utilize RW for stability, pt agreeable and demonstrated improved stability in  the standing position using the RW.  Ambulation/Gait Ambulation/Gait assistance: Min assist Gait Distance (Feet): 40 Feet (Pt ambulated 2 rounds of 20 ft with SpO2 dropping to 85%, returning to >90 % with seared 60 second rest break and pursed lip breathing on RA) Assistive device: Rolling  walker (2 wheeled) Gait Pattern/deviations: Trunk flexed;Shuffle Gait velocity: Decreased   General Gait Details: Pt demonstrated shuffling and decreased velcoity during ambulation. She verbalized not using AD for ambulation at her PLOF, but today she was reaching for therapist and furniture to stabilize herself. Pt encouraged and educated on how to use the RW to improve safety and stability during ambulation.  Stairs            Wheelchair Mobility    Modified Rankin (Stroke Patients Only)       Balance Overall balance assessment: Needs assistance Sitting-balance support: Feet supported Sitting balance-Leahy Scale: Good Sitting balance - Comments: Pt able to complete dynamic reaching activities in the seated position safely without LOB   Standing balance support: Bilateral upper extremity supported Standing balance-Leahy Scale: Poor Standing balance comment: Pt demonstrated decreased stability in the standing position requiring BIL UE assist on RW to stand safely         Rhomberg - Eyes Opened: 5 Rhomberg - Eyes Closed: 0   High Level Balance Comments: Pt unable to complete feet together EO or EC safely requiring assist from therapist to maintain balance and safety without LOB             Pertinent Vitals/Pain Pain Assessment: No/denies pain    Home Living Family/patient expects to be discharged to:: Private residence Living Arrangements: Children;Other (Comment) (Per previous eval pt lives with her dtr Bridget [name] who works from home. Pt reports that she has not been staying with her dtr a long time, but feels like she is starting to have difficulty completing her home task so she decided to stay with her dtr Bridget [name].) Available Help at Discharge: Family;Available 24 hours/day Type of Home: House Home Access: Stairs to enter Entrance Stairs-Rails: Left Entrance Stairs-Number of Steps: 2-3 steps Home Layout: One level Home Equipment: Walker - 2 wheels;Cane - single  point;Shower seat;Bedside commode;Grab bars - tub/shower Additional Comments: Per OT eval, pt's dtr Bridget [name] works from home and is able to assist all the time. Pt demonstrated some mild memory deficits, losing her train of though and repetitive statements.    Prior Function Level of Independence: Needs assistance   Gait / Transfers Assistance Needed: She owns a cane and a walker, but reports that she only uses her walker when she knows she is going ot be walking for an extended period of time (IE: shopping)  ADL's / Homemaking Assistance Needed: Pt reports she is able to perform all basic self care, but that Bridget [name] helps with Laundry, cooking, cleaning and transportation. Pt no longer drives.  Comments: No reported falls this yr     Hand Dominance   Dominant Hand: Right    Extremity/Trunk Assessment        Lower Extremity Assessment Lower Extremity Assessment: Generalized weakness (PT reports "weakness" in her legs following ambulation)    Cervical / Trunk Assessment Cervical / Trunk Assessment: Kyphotic  Communication   Communication: No difficulties  Cognition Arousal/Alertness: Awake/alert Behavior During Therapy: WFL for tasks assessed/performed Overall Cognitive Status: Within Functional Limits for tasks assessed  General Comments: Pt was oriented to self, she knew she was in a hospital but could not specifically name it and she got the month correct, but no the day. Pt able to follow commands appropriately and was very pleasant to work with.      General Comments General comments (skin integrity, edema, etc.): Pt with decreased safety awareness, will need reinforcement with RW use for ambulation and safe transfers    Exercises Total Joint Exercises Ankle Circles/Pumps: AROM;Both;20 reps Towel Squeeze: Strengthening;Both;10 reps (3 second hold) Knee Flexion: AROM;Strengthening;Both;10 reps (VCs for pace) Other Exercises Other  Exercises: Seated alternating marches x10, pt educated throughout interventions today how to utilize pursed lip breathing to maintain SpO2 >90% with activity and functional mobility   Assessment/Plan    PT Assessment Patient needs continued PT services  PT Problem List Decreased strength;Decreased mobility;Decreased safety awareness;Decreased activity tolerance;Decreased cognition;Cardiopulmonary status limiting activity;Decreased balance;Decreased knowledge of use of DME       PT Treatment Interventions DME instruction;Therapeutic activities;Gait training;Therapeutic exercise;Patient/family education;Stair training;Functional mobility training;Balance training    PT Goals (Current goals can be found in the Care Plan section)  Acute Rehab PT Goals Patient Stated Goal: Get stronger PT Goal Formulation: With patient Time For Goal Achievement: 11/22/20 Potential to Achieve Goals: Good    Frequency Min 2X/week   Barriers to discharge   NA    Co-evaluation               AM-PAC PT "6 Clicks" Mobility  Outcome Measure Help needed turning from your back to your side while in a flat bed without using bedrails?: A Little Help needed moving from lying on your back to sitting on the side of a flat bed without using bedrails?: A Little Help needed moving to and from a bed to a chair (including a wheelchair)?: A Little Help needed standing up from a chair using your arms (e.g., wheelchair or bedside chair)?: A Little Help needed to walk in hospital room?: A Little Help needed climbing 3-5 steps with a railing? : A Little 6 Click Score: 18    End of Session Equipment Utilized During Treatment: Gait belt Activity Tolerance: Patient tolerated treatment well Patient left: in chair;with call bell/phone within reach;with chair alarm set   PT Visit Diagnosis: Unsteadiness on feet (R26.81);Other abnormalities of gait and mobility (R26.89);Muscle weakness (generalized) (M62.81)    Time:  0076-2263 PT Time Calculation (min) (ACUTE ONLY): 41 min   Charges:   PT Evaluation $PT Eval Low Complexity: 1 Low PT Treatments $Gait Training: 8-22 mins $Therapeutic Activity: 8-22 mins        Minette Headland, PT, DPT 11/08/20, 3:55 PM   Jobie Popp Marcelle Overlie 11/08/2020, 3:41 PM

## 2020-11-08 NOTE — Progress Notes (Signed)
Patient ID: Bridget [name], female   DOB: 08-09-1925, 87 y.o.   MRN: [id number] [facility] PROGRESS NOTE  JAMECIA LERMAN OBS:[id number] DOB: Jun 16, 1925 DOA: 11/07/2020 PCP: Glori Bickers, MD  HPI/Subjective: Patient came in with respiratory distress yesterday and found to be hypoxic.  Was initially placed on BiPAP.  CO2 on venous blood gas was 69.  Patient feels about the same with her breathing today.  Has some wheezing.  No complaints of chest pain.  Daughter states that she had an episode of sweating when she came in.  Objective: Vitals:   11/08/20 1127 11/08/20 1330  BP: (!) 103/58   Pulse: (!) 107 (!) 105  Resp: 19 18  Temp: 97.8 F (36.6 C)   SpO2: 97% 94%    Intake/Output Summary (Last 24 hours) at 11/08/2020 1429 Last data filed at 11/08/2020 1409 Gross per 24 hour  Intake 240 ml  Output 300 ml  Net -60 ml   Filed Weights   11/07/20 0619  Weight: 56.7 kg    ROS: Review of Systems  Respiratory: Positive for shortness of breath and wheezing.   Cardiovascular: Negative for chest pain.  Gastrointestinal: Negative for abdominal pain, nausea and vomiting.   Exam: Physical Exam HENT:     Head: Normocephalic.     Mouth/Throat:     Pharynx: No oropharyngeal exudate.  Eyes:     General: Lids are normal.     Conjunctiva/sclera: Conjunctivae normal.     Pupils: Pupils are equal, round, and reactive to light.  Cardiovascular:     Rate and Rhythm: Normal rate. Rhythm irregularly irregular.     Heart sounds: Normal heart sounds, S1 normal and S2 normal.  Pulmonary:     Breath sounds: Examination of the right-middle field reveals wheezing. Examination of the left-middle field reveals wheezing. Examination of the right-lower field reveals decreased breath sounds and wheezing. Examination of the left-lower field reveals decreased breath sounds and wheezing. Decreased breath sounds and wheezing present. No rhonchi or rales.  Abdominal:     Palpations: Abdomen is soft.      Tenderness: There is no abdominal tenderness.  Musculoskeletal:     Right lower leg: No swelling.     Left lower leg: No swelling.  Skin:    General: Skin is warm.     Findings: No rash.  Neurological:     Mental Status: She is alert and oriented to person, place, and time.       Data Reviewed: Basic Metabolic Panel: Recent Labs  Lab 11/07/20 0620 11/08/20 0501  NA 138 136  K 4.1 4.4  CL 103 103  CO2 26 26  GLUCOSE 210* 132*  BUN 20 21  CREATININE 0.88 0.83  CALCIUM 9.2 9.0   Liver Function Tests: Recent Labs  Lab 11/07/20 0620  AST 48*  ALT 34  ALKPHOS 60  BILITOT 1.2  PROT 7.2  ALBUMIN 4.0   CBC: Recent Labs  Lab 11/07/20 0620 11/08/20 0501  WBC 9.0 10.7*  NEUTROABS 3.0  --   HGB 16.5* 14.5  HCT 50.2* 43.7  MCV 95.8 93.6  PLT 193 170   BNP (last 3 results) Recent Labs    08/31/20 0535 11/07/20 0620  BNP 691.0* 374.5*     Recent Results (from the past 240 hour(s))  Resp Panel by RT-PCR (Flu A&B, Covid) Nasopharyngeal Swab     Status: None   Collection Time: 11/07/20  6:21 AM   Specimen: Nasopharyngeal Swab; Nasopharyngeal(NP) swabs in vial transport  medium  Result Value Ref Range Status   SARS Coronavirus 2 by RT PCR NEGATIVE NEGATIVE Final    Comment: (NOTE) SARS-CoV-2 target nucleic acids are NOT DETECTED.  The SARS-CoV-2 RNA is generally detectable in upper respiratory specimens during the acute phase of infection. The lowest concentration of SARS-CoV-2 viral copies this assay can detect is 138 copies/mL. A negative result does not preclude SARS-Cov-2 infection and should not be used as the sole basis for treatment or other patient management decisions. A negative result may occur with  improper specimen collection/handling, submission of specimen other than nasopharyngeal swab, presence of viral mutation(s) within the areas targeted by this assay, and inadequate number of viral copies(<138 copies/mL). A negative result must be  combined with clinical observations, patient history, and epidemiological information. The expected result is Negative.  Fact Sheet for Patients:  BloggerCourse.com  Fact Sheet for Healthcare Providers:  SeriousBroker.it  This test is no t yet approved or cleared by the [place] FDA and  has been authorized for detection and/or diagnosis of SARS-CoV-2 by FDA under an Emergency Use Authorization (EUA). This EUA will remain  in effect (meaning this test can be used) for the duration of the COVID-19 declaration under Section 564(b)(1) of the Act, 21 U.S.C.section 360bbb-3(b)(1), unless the authorization is terminated  or revoked sooner.       Influenza A by PCR NEGATIVE NEGATIVE Final   Influenza B by PCR NEGATIVE NEGATIVE Final    Comment: (NOTE) The Xpert Xpress SARS-CoV-2/FLU/RSV plus assay is intended as an aid in the diagnosis of influenza from Nasopharyngeal swab specimens and should not be used as a sole basis for treatment. Nasal washings and aspirates are unacceptable for Xpert Xpress SARS-CoV-2/FLU/RSV testing.  Fact Sheet for Patients: BloggerCourse.com  Fact Sheet for Healthcare Providers: SeriousBroker.it  This test is not yet approved or cleared by the [place] FDA and has been authorized for detection and/or diagnosis of SARS-CoV-2 by FDA under an Emergency Use Authorization (EUA). This EUA will remain in effect (meaning this test can be used) for the duration of the COVID-19 declaration under Section 564(b)(1) of the Act, 21 U.S.C. section 360bbb-3(b)(1), unless the authorization is terminated or revoked.  Performed at [facility], [address] [place], [place] [address]      Studies: DG Chest Portable 1 View  Result Date: 11/07/2020 CLINICAL DATA:  Shortness of breath EXAM: PORTABLE CHEST 1 VIEW COMPARISON:  08/31/2020 FINDINGS:  Defibrillator pads overlying the mediastinum and left lower hemithorax. Lungs are essentially clear.  No pleural effusion or pneumothorax. The heart is normal in size. IMPRESSION: No evidence of acute cardiopulmonary disease. Electronically Signed   By: Charline Bills M.D.   On: 11/07/2020 06:48    Scheduled Meds: . aspirin  81 mg Oral Daily  . atorvastatin  10 mg Oral QHS  . diltiazem  30 mg Oral Q6H  . enoxaparin (LOVENOX) injection  40 mg Subcutaneous Q24H  . ipratropium-albuterol  3 mL Nebulization Q6H  . methylPREDNISolone (SOLU-MEDROL) injection  40 mg Intravenous Daily  . sodium chloride flush  3 mL Intravenous Q12H   Continuous Infusions: . sodium chloride      Assessment/Plan:  1. Acute hypoxic hypercarbic respiratory failure.  Patient required BiPAP on presentation.  Pulse ox initially 81% on room air.  Venous blood gas showing a PCO2 of 69 and a pH of 7.23.  Patient is a candidate for noninvasive ventilation at night.  Will order BiPAP at night. 2. COPD exacerbation  with diffuse wheeze.  Continue Solu-Medrol and nebulizer treatments. 3. Rising troponin still believed to be secondary to demand ischemia.  Started on aspirin and atorvastatin.  Cardiology following.  Troponin was 22 on presentation and now up to 601.  No complaints of chest pain.  Echocardiogram ordered. 4. Chronic atrial fibrillation not on any long-term anticoagulation secondary to risk of falls.  Cardiology added low-dose Cardizem. 5. Essential hypertension.  Blood pressure on the lower side I discontinued hydrochlorothiazide this morning.  Cardiology discontinued ACE inhibitor and started low-dose Cardizem. 6. Chronic kidney disease stage II 7. Chronic diastolic congestive heart failure.  Appears euvolemic at this point.        Code Status:     Code Status Orders  (From admission, onward)         Start     Ordered   11/07/20 0919  Full code  Continuous        11/07/20 0920        Code Status  History    Date Active Date Inactive Code Status Order ID Comments User Context   08/31/2020 0832 09/04/2020 2051 DNR [id number]  Lucile Shutters, MD ED   08/31/2020 0816 08/31/2020 0832 DNR [id number]  Willy Eddy, MD ED   07/06/2020 0130 07/07/2020 2248 Full Code [id number]  Hillary Bow, DO Inpatient   Advance Care Planning Activity     Family Communication: Spoke with patient's daughter on the phone Disposition Plan: Status is: Inpatient  Dispo: The patient is from: Home              Anticipated d/c is to: Home              Patient currently medically being treated with IV Solu-Medrol for COPD exacerbation acute hypoxic hypercarbic respiratory failure   Difficult to place patient.  No.  Consultants:  Cardiology  Time spent: 28 minutes  Abrian [employer]

## 2020-11-09 ENCOUNTER — Inpatient Hospital Stay (HOSPITAL_COMMUNITY)
Admit: 2020-11-09 | Discharge: 2020-11-09 | Disposition: A | Discharge status: Home / Self Care | Payer: Medicare Other | Attending: Nurse Practitioner | Admitting: Nurse Practitioner

## 2020-11-09 DIAGNOSIS — R0609 Other forms of dyspnea: Secondary | ICD-10-CM

## 2020-11-09 DIAGNOSIS — I340 Nonrheumatic mitral (valve) insufficiency: Secondary | ICD-10-CM

## 2020-11-09 DIAGNOSIS — I361 Nonrheumatic tricuspid (valve) insufficiency: Secondary | ICD-10-CM

## 2020-11-09 LAB — ECHOCARDIOGRAM LIMITED
AV Peak grad: 5 mmHg
Ao pk vel: 1.12 m/s
Height: 64 in
S' Lateral: 2.64 cm
Weight: 2000.01 oz

## 2020-11-09 LAB — BASIC METABOLIC PANEL
Anion gap: 9 (ref 5–15)
BUN: 36 mg/dL — ABNORMAL HIGH (ref 8–23)
CO2: 27 mmol/L (ref 22–32)
Calcium: 9 mg/dL (ref 8.9–10.3)
Chloride: 99 mmol/L (ref 98–111)
Creatinine, Ser: 0.91 mg/dL (ref 0.44–1.00)
GFR, Estimated: 59 mL/min — ABNORMAL LOW (ref >60)
Glucose, Bld: 104 mg/dL — ABNORMAL HIGH (ref 70–99)
Potassium: 4.5 mmol/L (ref 3.5–5.1)
Sodium: 135 mmol/L (ref 135–145)

## 2020-11-09 LAB — TROPONIN I (HIGH SENSITIVITY): Troponin I (High Sensitivity): 348 ng/L (ref <18)

## 2020-11-09 MED ORDER — BISOPROLOL FUMARATE 5 MG PO TABS
5.0000 mg | ORAL_TABLET | Freq: Every day | ORAL | Status: DC
  Administered 2020-11-09 – 2020-11-10 (×2): 5 mg via ORAL
  Filled 2020-11-09 (×2): qty 1

## 2020-11-09 MED ORDER — HALOPERIDOL LACTATE 5 MG/ML IJ SOLN: 1.0000 mg | Freq: Four times a day (QID) | INTRAMUSCULAR | Status: DC | PRN

## 2020-11-09 NOTE — Progress Notes (Signed)
Patient ID: JATAYA WANN, female   DOB: 18-Feb-1926, 87 y.o.   MRN: [id number] [facility] PROGRESS NOTE  TAHIRAH SARA WCB:[id number] DOB: [birth year] DOA: 11/07/2020 PCP: Glori Bickers, MD  HPI/Subjective: Patient feels a little bit better today than when she came in.  Still with a little shortness of breath if moves around.  Some cough.  Able to take a deeper breath today.  Admitted with COPD exacerbation.  Objective: Vitals:   11/09/20 0822 11/09/20 1117  BP: 113/62 109/61  Pulse: 68 73  Resp: 18 18  Temp: (!) 97.5 F (36.4 C) (!) 97.5 F (36.4 C)  SpO2: 99% 99%    Intake/Output Summary (Last 24 hours) at 11/09/2020 1310 Last data filed at 11/09/2020 0900 Gross per 24 hour  Intake 480 ml  Output --  Net 480 ml   Filed Weights   11/07/20 0619  Weight: 56.7 kg    ROS: Review of Systems  Respiratory: Positive for shortness of breath.   Cardiovascular: Negative for chest pain.  Gastrointestinal: Negative for abdominal pain, nausea and vomiting.   Exam: Physical Exam HENT:     Head: Normocephalic.     Mouth/Throat:     Pharynx: No oropharyngeal exudate.  Eyes:     General: Lids are normal.     Conjunctiva/sclera: Conjunctivae normal.     Pupils: Pupils are equal, round, and reactive to light.  Cardiovascular:     Rate and Rhythm: Normal rate. Rhythm irregularly irregular.     Heart sounds: Normal heart sounds, S1 normal and S2 normal.  Pulmonary:     Breath sounds: Examination of the right-lower field reveals decreased breath sounds. Examination of the left-lower field reveals decreased breath sounds. Decreased breath sounds present. No wheezing, rhonchi or rales.  Abdominal:     Palpations: Abdomen is soft.     Tenderness: There is no abdominal tenderness.  Musculoskeletal:     Right lower leg: No swelling.     Left lower leg: No swelling.  Skin:    General: Skin is warm.     Comments: Chronic lower extremity edema  Neurological:     Mental Status:  She is alert and oriented to person, place, and time.       Data Reviewed: Basic Metabolic Panel: Recent Labs  Lab 11/07/20 0620 11/08/20 0501 11/09/20 0414  NA 138 136 135  K 4.1 4.4 4.5  CL 103 103 99  CO2 26 26 27   GLUCOSE 210* 132* 104*  BUN 20 21 36*  CREATININE 0.88 0.83 0.91  CALCIUM 9.2 9.0 9.0   Liver Function Tests: Recent Labs  Lab 11/07/20 0620  AST 48*  ALT 34  ALKPHOS 60  BILITOT 1.2  PROT 7.2  ALBUMIN 4.0   CBC: Recent Labs  Lab 11/07/20 0620 11/08/20 0501  WBC 9.0 10.7*  NEUTROABS 3.0  --   HGB 16.5* 14.5  HCT 50.2* 43.7  MCV 95.8 93.6  PLT 193 170   BNP (last 3 results) Recent Labs    08/31/20 0535 11/07/20 0620  BNP 691.0* 374.5*     Recent Results (from the past 240 hour(s))  Resp Panel by RT-PCR (Flu A&B, Covid) Nasopharyngeal Swab     Status: None   Collection Time: 11/07/20  6:21 AM   Specimen: Nasopharyngeal Swab; Nasopharyngeal(NP) swabs in vial transport medium  Result Value Ref Range Status   SARS Coronavirus 2 by RT PCR NEGATIVE NEGATIVE Final    Comment: (NOTE) SARS-CoV-2 target nucleic acids are NOT  DETECTED.  The SARS-CoV-2 RNA is generally detectable in upper respiratory specimens during the acute phase of infection. The lowest concentration of SARS-CoV-2 viral copies this assay can detect is 138 copies/mL. A negative result does not preclude SARS-Cov-2 infection and should not be used as the sole basis for treatment or other patient management decisions. A negative result may occur with  improper specimen collection/handling, submission of specimen other than nasopharyngeal swab, presence of viral mutation(s) within the areas targeted by this assay, and inadequate number of viral copies(<138 copies/mL). A negative result must be combined with clinical observations, patient history, and epidemiological information. The expected result is Negative.  Fact Sheet for Patients:   BloggerCourse.com  Fact Sheet for Healthcare Providers:  SeriousBroker.it  This test is no t yet approved or cleared by the [place] FDA and  has been authorized for detection and/or diagnosis of SARS-CoV-2 by FDA under an Emergency Use Authorization (EUA). This EUA will remain  in effect (meaning this test can be used) for the duration of the COVID-19 declaration under Section 564(b)(1) of the Act, 21 U.S.C.section 360bbb-3(b)(1), unless the authorization is terminated  or revoked sooner.       Influenza A by PCR NEGATIVE NEGATIVE Final   Influenza B by PCR NEGATIVE NEGATIVE Final    Comment: (NOTE) The Xpert Xpress SARS-CoV-2/FLU/RSV plus assay is intended as an aid in the diagnosis of influenza from Nasopharyngeal swab specimens and should not be used as a sole basis for treatment. Nasal washings and aspirates are unacceptable for Xpert Xpress SARS-CoV-2/FLU/RSV testing.  Fact Sheet for Patients: BloggerCourse.com  Fact Sheet for Healthcare Providers: SeriousBroker.it  This test is not yet approved or cleared by the [place] FDA and has been authorized for detection and/or diagnosis of SARS-CoV-2 by FDA under an Emergency Use Authorization (EUA). This EUA will remain in effect (meaning this test can be used) for the duration of the COVID-19 declaration under Section 564(b)(1) of the Act, 21 U.S.C. section 360bbb-3(b)(1), unless the authorization is terminated or revoked.  Performed at [facility], [address] [place], [place] [address]      Studies: ECHOCARDIOGRAM LIMITED  Result Date: 11/09/2020    ECHOCARDIOGRAM LIMITED REPORT   Patient Name:   JERICCA RUSSETT Date of Exam: 11/09/2020 Medical Rec #:  [id number]      Height:       64.0 in Accession #:    [id number]     Weight:       125.0 lb Date of Birth:  June 08, 1926      BSA:          1.602 m  Patient Age:    89 years       BP:           103/60 mmHg Patient Gender: F              HR:           77 bpm. Exam Location:  [facility] Procedure: 2D Echo Indications:     Dyspnea R06.00  History:         Patient has prior history of Echocardiogram examinations, most                  recent 08/31/2020.  Sonographer:     Wonda Cerise Referring Phys:  [id number] CHRISTOPHER RONALD BERGE Diagnosing Phys: Julien Nordmann MD IMPRESSIONS  1. Left ventricular ejection fraction, by estimation, is 55 %. The left ventricle has normal function. The left ventricle  has no regional wall motion abnormalities.  2. Right ventricular systolic function is normal. The right ventricular size is mildly enlarged. There is moderately elevated pulmonary artery systolic pressure. The estimated right ventricular systolic pressure is 56.0 mmHg.  3. Left atrial size was mildly dilated.  4. Right atrial size was moderately dilated.  5. The mitral valve is normal in structure. Mild to moderate mitral valve regurgitation  6. The inferior vena cava is dilated in size with <50% respiratory variability, suggesting right atrial pressure of 15 mmHg. FINDINGS  Left Ventricle: Left ventricular ejection fraction, by estimation, is 55 %. The left ventricle has normal function. The left ventricle has no regional wall motion abnormalities. The left ventricular internal cavity size was normal in size. There is no left ventricular hypertrophy. Right Ventricle: The right ventricular size is mildly enlarged. No increase in right ventricular wall thickness. Right ventricular systolic function is normal. There is moderately elevated pulmonary artery systolic pressure. The tricuspid regurgitant velocity is 3.20 m/s, and with an assumed right atrial pressure of 15 mmHg, the estimated right ventricular systolic pressure is 56.0 mmHg. Left Atrium: Left atrial size was mildly dilated. Right Atrium: Right atrial size was moderately dilated. Pericardium: There is no evidence of  pericardial effusion. Mitral Valve: The mitral valve is normal in structure. Mild to moderate mitral valve regurgitation. No evidence of mitral valve stenosis. Tricuspid Valve: The tricuspid valve is normal in structure. Tricuspid valve regurgitation is mild . No evidence of tricuspid stenosis. Aortic Valve: The aortic valve is normal in structure. Aortic valve regurgitation is not visualized. No aortic stenosis is present. Aortic valve peak gradient measures 5.0 mmHg. Pulmonic Valve: The pulmonic valve was normal in structure. Pulmonic valve regurgitation is not visualized. No evidence of pulmonic stenosis. Aorta: The aortic root is normal in size and structure. Venous: The inferior vena cava is dilated in size with less than 50% respiratory variability, suggesting right atrial pressure of 15 mmHg. IAS/Shunts: No atrial level shunt detected by color flow Doppler. LEFT VENTRICLE PLAX 2D LVIDd:         3.93 cm Diastology LVIDs:         2.64 cm LV e' medial:  5.00 cm/s LV PW:         1.07 cm LV e' lateral: 6.96 cm/s LV IVS:        1.19 cm  LEFT ATRIUM         Index      RIGHT ATRIUM           Index LA diam:    4.70 cm 2.93 cm/m RA Area:     24.80 cm                                RA Volume:   82.70 ml  51.62 ml/m  AORTIC VALVE AV Vmax:      112.00 cm/s AV Peak Grad: 5.0 mmHg TRICUSPID VALVE TV Peak grad:   38.7 mmHg TV Vmax:        3.11 m/s TR Peak grad:   41.0 mmHg TR Vmax:        320.00 cm/s Julien Nordmann MD Electronically signed by Julien Nordmann MD Signature Date/Time: 11/09/2020/12:23:50 PM    Final     Scheduled Meds: . aspirin  81 mg Oral Daily  . atorvastatin  10 mg Oral QHS  . bisoprolol  5 mg Oral Daily  . enoxaparin (LOVENOX) injection  40 mg Subcutaneous Q24H  . ipratropium-albuterol  3 mL Nebulization BID  . methylPREDNISolone (SOLU-MEDROL) injection  40 mg Intravenous Daily  . mometasone-formoterol  2 puff Inhalation BID  . sodium chloride flush  3 mL Intravenous Q12H   Continuous  Infusions: . sodium chloride      Assessment/Plan:  1. Acute hypoxic hypercarbic respiratory failure.  Patient required BiPAP on presentation.  Pulse ox initially 81% on room air.  Venous blood gas showing a PCO2 of 69 and a pH of 7.23.  The patient is a candidate for noninvasive ventilation at night I did order a BiPAP but she did not wear it.  Patient does not think she will want it.  We will get an overnight oximetry to see if she qualifies for nighttime oxygen.  Able to come off oxygen during the day at rest.  We will also check a pulse ox with ambulation. 2. COPD exacerbation.  Lungs sound better today than yesterday.  Continue Solu-Medrol and nebulizer treatments.  Added Dulera inhaler yesterday. 3. Elevated troponin secondary to demand ischemia.  Cardiology started aspirin, bisoprolol and atorvastatin.  Troponin peaked at 601 and has come down to 348.  Echocardiogram shows a normal EF but high pulmonary pressures. 4. Chronic atrial fibrillation.  Patient not on long-term anticoagulation secondary to the risk of falls.  Cardiology added bisoprolol. 5. Essential hypertension.  Blood pressure on the lower side I discontinued hydrochlorothiazide and ACE inhibitor.  Cardiology started bisoprolol. 6. Chronic kidney disease stage IIIa, borderline stage II 7. Chronic diastolic congestive heart failure.  No signs of heart failure currently.        Code Status:     Code Status Orders  (From admission, onward)         Start     Ordered   11/07/20 0919  Full code  Continuous        11/07/20 0920        Code Status History    Date Active Date Inactive Code Status Order ID Comments User Context   08/31/2020 0832 09/04/2020 2051 DNR [id number]  Lucile ShuttersAgbata, Tochukwu, MD ED   08/31/2020 0816 08/31/2020 0832 DNR [id number]  Willy Eddyobinson, Patrick, MD ED   07/06/2020 0130 07/07/2020 2248 Full Code [id number]  Hillary BowGardner, Jared M, DO Inpatient   Advance Care Planning Activity     Family Communication: Spoke  with patient's daughter on the phone Disposition Plan: Status is: Inpatient  Dispo: The patient is from: Home              Anticipated d/c is to: Home with home health              Patient currently still on IV Solu-Medrol for COPD exacerbation   Difficult to place patient.  No.  Consultants:  Cardiology  Time spent: 27 minutes  [employer]ao B[employer]za [employer]  [facility]

## 2020-11-09 NOTE — Progress Notes (Signed)
Initial Nutrition Assessment  DOCUMENTATION CODES:  Not applicable  INTERVENTION:   Liberalize diet to regular, encourage PO intake  Magic cup TID with meals, each supplement provides 290 kcal and 9 grams of protein  Monitor intake trends for need for additional supplements  NUTRITION DIAGNOSIS:  Increased nutrient needs related to acute illness as evidenced by estimated needs.  GOAL:  Patient will meet greater than or equal to 90% of their needs  MONITOR:  PO intake  REASON FOR ASSESSMENT:  Consult Assessment of nutrition requirement/status  ASSESSMENT:  Pt presented to ED with shortness of breath worsen over the last 2 days. PMH includes asthma, COPD, hypertension, A. fib, chronic kidney disease, CHF. Admitted 1/23-1/25 with COVID19 and 3/21-3/25 with COPD exacerbation. Has been followed by RD at previous admissions.  Pt resting in bed at the time of visit, RN administering medications. Pt endorses a good appetite and that she is enjoying her meals. States that at home she normally eats 3 meals and may have a snack during the day. Reports that she lives alone but daughter is nearby if she needs assistance. Denies any major weight changes recently or GI distress. Pt agreeable to a nutrition supplement if needed, however appetite is good and is likely meeting nutrition needs without interventions. Hopeful to go home tomorrow.   Will recommend liberalizing due to advanced age and some deficits that were seen on exam. Monitor intake trends for need for additional nutrition interventions.    Average Meal Intake: . 5/28-5/30: 96% intake x 4 recorded meals  Nutritionally Relevant Medications Scheduled Meds: . atorvastatin  10 mg Oral QHS  . bisoprolol  5 mg Oral Daily  . methylPREDNISolone (SOLU-MEDROL) injection  40 mg Intravenous Daily   Labs reviewed  NUTRITION - FOCUSED PHYSICAL EXAM: deficits likely related to normal aging, no weight loss or decreased appetite reported.   Flowsheet Row Most Recent Value  Orbital Region Mild depletion  Upper Arm Region Mild depletion  Thoracic and Lumbar Region No depletion  Buccal Region No depletion  Temple Region No depletion  Clavicle Bone Region Moderate depletion  Clavicle and Acromion Bone Region Moderate depletion  Scapular Bone Region No depletion  Dorsal Hand Mild depletion  Patellar Region No depletion  Anterior Thigh Region No depletion  Posterior Calf Region No depletion  Edema (RD Assessment) Mild  [generalized edema to the legs]  Hair Reviewed  Eyes Reviewed  Mouth Reviewed  Skin Reviewed  Nails Reviewed     Diet Order:   Diet Order            Diet regular Room service appropriate? Yes; Fluid consistency: Thin  Diet effective now                 EDUCATION NEEDS:  No education needs have been identified at this time  Skin:  Skin Assessment: Reviewed RN Assessment  Last BM:  5/30, type 4. Per RN documentation  Height:  Ht Readings from Last 1 Encounters:  11/07/20 5\' 4"  (1.626 m)    Weight:  Wt Readings from Last 1 Encounters:  11/07/20 56.7 kg    Ideal Body Weight:  54.5 kg  BMI:  Body mass index is 21.46 kg/m.  Estimated Nutritional Needs:   Kcal:  1400-1700 kcal/d  Protein:  70-85 g/d  Fluid:  >1500 mL/d  11/09/20, RD, LDN Clinical Dietitian Pager on Amion

## 2020-11-09 NOTE — Progress Notes (Signed)
*  PRELIMINARY RESULTS* Echocardiogram 2D Echocardiogram has been performed.  Lenor Coffin 11/09/2020, 11:42 AM

## 2020-11-09 NOTE — Progress Notes (Signed)
Progress Note  Patient Name: Bridget [name] Date of Encounter: 11/09/2020  [facility] Cardiologist: Julien Nordmann, MD  Subjective   Reports feeling much better, close to her baseline Reports that she did some walking yesterday in the hallway, felt well Denies chest pain concerning for angina   Inpatient Medications    Scheduled Meds: . aspirin  81 mg Oral Daily  . atorvastatin  10 mg Oral QHS  . diltiazem  30 mg Oral Q6H  . enoxaparin (LOVENOX) injection  40 mg Subcutaneous Q24H  . ipratropium-albuterol  3 mL Nebulization BID  . methylPREDNISolone (SOLU-MEDROL) injection  40 mg Intravenous Daily  . mometasone-formoterol  2 puff Inhalation BID  . sodium chloride flush  3 mL Intravenous Q12H   Continuous Infusions: . sodium chloride     PRN Meds: sodium chloride, albuterol, sodium chloride flush   Vital Signs    Vitals:   11/09/20 0037 11/09/20 0424 11/09/20 0820 11/09/20 0822  BP: 110/80 103/60  113/62  Pulse: 66 60 98 68  Resp: 20 16 15 18   Temp: (!) 97.5 F (36.4 C) (!) 97.4 F (36.3 C)  (!) 97.5 F (36.4 C)  TempSrc: Oral Oral  Oral  SpO2: 98% 97% 95% 99%  Weight:      Height:        Intake/Output Summary (Last 24 hours) at 11/09/2020 1045 Last data filed at 11/09/2020 0900 Gross per 24 hour  Intake 480 ml  Output --  Net 480 ml   Last 3 Weights 11/07/2020 08/31/2020 07/05/2020  Weight (lbs) 125 lb 125 lb 125 lb  Weight (kg) 56.7 kg 56.7 kg 56.7 kg      Telemetry    Atrial fibrillation- Personally Reviewed  ECG     - Personally Reviewed  Physical Exam   GEN: No acute distress.   Neck: No JVD Cardiac:  Irregularly irregular, no murmurs, rubs, or gallops.  Respiratory:  Mildly decreased breath sounds throughout, scattered wheeze GI: Soft, nontender, non-distended  MS: No edema; No deformity. Neuro:  Nonfocal  Psych: Normal affect   Labs    High Sensitivity Troponin:   Recent Labs  Lab 11/07/20 0825 11/07/20 1203 11/07/20 1624  11/08/20 1150 11/09/20 0432  TROPONINIHS 168* 474* 571* 601* 348*      Chemistry Recent Labs  Lab 11/07/20 0620 11/08/20 0501 11/09/20 0414  NA 138 136 135  K 4.1 4.4 4.5  CL 103 103 99  CO2 26 26 27   GLUCOSE 210* 132* 104*  BUN 20 21 36*  CREATININE 0.88 0.83 0.91  CALCIUM 9.2 9.0 9.0  PROT 7.2  --   --   ALBUMIN 4.0  --   --   AST 48*  --   --   ALT 34  --   --   ALKPHOS 60  --   --   BILITOT 1.2  --   --   GFRNONAA >60 >60 59*  ANIONGAP 9 7 9      Hematology Recent Labs  Lab 11/07/20 0620 11/08/20 0501  WBC 9.0 10.7*  RBC 5.24* 4.67  HGB 16.5* 14.5  HCT 50.2* 43.7  MCV 95.8 93.6  MCH 31.5 31.0  MCHC 32.9 33.2  RDW 14.0 14.0  PLT 193 170    BNP Recent Labs  Lab 11/07/20 0620  BNP 374.5*     DDimer No results for input(s): DDIMER in the last 168 hours.   Radiology    No results found.  Cardiac Studies  Patient Profile     87 y.o. female with a history of permanent Afib, HTN, COPD/Asthma, HFpEF,and mild dementia, who was admitted 5/28 w/ worsening dyspnea and HsTrop elevation.  Assessment & Plan    Demand ischemia  Troponin up to 600  in the setting of COPD exacerbation, hypoxia, unable to exclude component of pulmonary edema -Long smoking history, likely has underlying coronary disease -Plan for outpatient follow-up, consider outpatient Myoview Recent echocardiogram March 2022 normal ejection fraction On Eliquis in place of aspirin    Acute on chronic hypoxic respiratory failure  Multifactorial including COPD, unable to exclude component of diastolic CHF/pulmonary hypertension in the setting of atrial fibrillation. BNP not particularly elevated  --With severe lung disease, she would have a narrow therapeutic window  -On steroids, nebulizers  -Consider pulse steroids (she could have these on hand) and dose of Lasix 20 p.o. as needed as outpatient for worsening shortness of breath  -Follow-up with pulmonary   Permanent  atrial fibrillation  Continue Eliquis  -Not tolerating diltiazem secondary to hypotension, will discontinue diltiazem On Eliquis  -We will add very low-dose bisoprolol 5 mg daily   Severe COPD  On steroids, nebulizers  ACE discontinued given low blood pressure   [facility] will sign off.   Medication Recommendations: Changes as above Other recommendations (labs, testing, etc): No further testing at this time Follow up as an outpatient: We will arrange follow-up in clinic  Long discussion with her concerning follow-up, atrial fibrillation, rate control, management of her lungs with steroids  Total encounter time more than 35 minutes  Greater than 50% was spent in counseling and coordination of care with the patient     For questions or updates, please contact [facility] Please consult [email or web address] for contact info under        Signed, Julien Nordmann, MD  11/09/2020, 10:45 AM

## 2020-11-09 NOTE — TOC Initial Note (Addendum)
Transition of Care [facility]) - Initial/Assessment Note    Patient Details  Name: Bridget [name] MRN: [id number] Date of Birth: 29-Nov-1925  Transition of Care [facility]) CM/SW Contact:    Candie Chroman, LCSW Phone Number: 11/09/2020, 11:25 AM  Clinical Narrative: CSW met with patient. No supports at bedside. CSW introduced role and explained that PT recommendations would be discussed. Patient is agreeable to home health. Provided CMS scores for agencies that serve her zip code. She will review with her daughter when she arrives and notify CSW of preference. No DME recommendations. Per chart patient refused bipap last night. MD is aware and will discuss with daughter. No further concerns. CSW encouraged patient to contact CSW as needed. CSW will continue to follow patient for support and facilitate return home when stable.     3:57 pm: Went back by room. Patient said her daughter has not arrived yet.            Expected Discharge Plan: [place] Barriers to Discharge: Continued Medical Work up   Patient Goals and CMS Choice   CMS Medicare.gov Compare Post Acute Care list provided to:: Patient    Expected Discharge Plan and Services Expected Discharge Plan: [place] Choice: [place] arrangements for the past 2 months: Single Family Home                                      Prior Living Arrangements/Services Living arrangements for the past 2 months: Single Family Home Lives with:: Self Patient language and need for interpreter reviewed:: Yes Do you feel safe going back to the place where you live?: Yes      Need for Family Participation in Patient Care: Yes (Comment) Care giver support system in place?: Yes (comment) Current home services: DME Criminal Activity/Legal Involvement Pertinent to Current Situation/Hospitalization: No - Comment as needed  Activities of Daily Living Home Assistive Devices/Equipment:  None ADL Screening (condition at time of admission) Patient's cognitive ability adequate to safely complete daily activities?: Yes Is the patient deaf or have difficulty hearing?: Yes Does the patient have difficulty seeing, even when wearing glasses/contacts?: No Does the patient have difficulty concentrating, remembering, or making decisions?: No Patient able to express need for assistance with ADLs?: No Does the patient have difficulty dressing or bathing?: No Independently performs ADLs?: Yes (appropriate for developmental age) Does the patient have difficulty walking or climbing stairs?: No Weakness of Legs: None Weakness of Arms/Hands: None  Permission Sought/Granted Permission sought to share information with : Facility Contact Representative,Family Supports Permission granted to share information with : Yes, Verbal Permission Granted  Share Information with NAME: Clyda Hurdle  Permission granted to share info w AGENCY: [facility] [place] granted to share info w Relationship: Daughter  Permission granted to share info w Contact Information: [phone]  Emotional Assessment Appearance:: Appears stated age Attitude/Demeanor/Rapport: Engaged,Gracious Affect (typically observed): Accepting,Appropriate,Calm,Pleasant Orientation: : Oriented to Self,Oriented to Place,Oriented to  Time,Oriented to Situation Alcohol / Substance Use: Not Applicable Psych Involvement: No (comment)  Admission diagnosis:  Acute respiratory failure (HCC) [J96.00] COPD with acute exacerbation (HCC) [J44.1] Acute respiratory failure with hypoxia and hypercapnia ([place]) [J96.01, J96.02] Patient Active Problem List   Diagnosis Date Noted  . CKD (chronic kidney disease), stage II   . Chronic diastolic CHF (congestive heart failure) ([place])   .  Acute respiratory failure ([place]) 11/07/2020  . Elevated troponin 11/07/2020  . COPD with acute exacerbation ([place]) 08/31/2020  . Atrial fibrillation, chronic  ([place]) 08/31/2020  . Acute CHF (congestive heart failure) ([place]) 08/31/2020  . CKD (chronic kidney disease), stage III ([place]) 08/31/2020  . Essential hypertension 07/06/2020  . Acute lower UTI 07/06/2020  . Acute respiratory disease due to COVID-19 virus 07/05/2020   PCP:  Keane Police, MD Pharmacy:   [facility] [address], [place] - [place] [address] [place] [address] Phone: [phone] Fax: [phone]  [facility] [place], [place] - [place] [address] [place] [address] Phone: [phone] Fax: [phone]  [id number] [place]Phone: [phone]: [phone]    Social Determinants of Health (SDOH) Interventions    Readmission Risk Interventions No flowsheet data found.

## 2020-11-09 NOTE — Progress Notes (Signed)
Pt care assumed. Pt given neb tx as ordered. Pt consulted on niv usage. Pt informed of order and why it was ordered. Pt seemed to understand, but did not want to be placed on unit. RN informed pt left in [place]. Will follow.

## 2020-11-10 LAB — BLOOD GAS, ARTERIAL
Acid-Base Excess: 6.7 mmol/L — ABNORMAL HIGH (ref 0.0–2.0)
Bicarbonate: 31.3 mmol/L — ABNORMAL HIGH (ref 20.0–28.0)
FIO2: 0.21
O2 Saturation: 97.2 %
Patient temperature: 37
pCO2 arterial: 43 mmHg (ref 32.0–48.0)
pH, Arterial: 7.47 — ABNORMAL HIGH (ref 7.350–7.450)
pO2, Arterial: 87 mmHg (ref 83.0–108.0)

## 2020-11-10 MED ORDER — FUROSEMIDE 20 MG PO TABS: 20.0000 mg | ORAL_TABLET | Freq: Every day | ORAL | 0 refills | Status: DC

## 2020-11-10 MED ORDER — BISOPROLOL FUMARATE 5 MG PO TABS: 5.0000 mg | ORAL_TABLET | Freq: Every day | ORAL | 0 refills | Status: AC

## 2020-11-10 MED ORDER — IPRATROPIUM-ALBUTEROL 0.5-2.5 (3) MG/3ML IN SOLN: 3.0000 mL | Freq: Four times a day (QID) | RESPIRATORY_TRACT | 0 refills | Status: DC

## 2020-11-10 MED ORDER — ASPIRIN 81 MG PO CHEW: 81.0000 mg | CHEWABLE_TABLET | Freq: Every day | ORAL | 0 refills | Status: DC

## 2020-11-10 MED ORDER — ATORVASTATIN CALCIUM 10 MG PO TABS: 10.0000 mg | ORAL_TABLET | Freq: Every day | ORAL | 0 refills | Status: DC

## 2020-11-10 MED ORDER — SPACER/AERO-HOLDING CHAMBERS DEVI: 1.0000 | Freq: Two times a day (BID) | 0 refills | Status: DC

## 2020-11-10 MED ORDER — MOMETASONE FURO-FORMOTEROL FUM 100-5 MCG/ACT IN AERO: 2.0000 | INHALATION_SPRAY | Freq: Two times a day (BID) | RESPIRATORY_TRACT | 0 refills | Status: DC

## 2020-11-10 MED ORDER — PREDNISONE 20 MG PO TABS: ORAL_TABLET | ORAL | 0 refills | Status: DC

## 2020-11-10 MED ORDER — ALBUTEROL SULFATE HFA 108 (90 BASE) MCG/ACT IN AERS: 2.0000 | INHALATION_SPRAY | Freq: Four times a day (QID) | RESPIRATORY_TRACT | 0 refills | Status: DC | PRN

## 2020-11-10 MED ORDER — PREDNISONE 20 MG PO TABS
40.0000 mg | ORAL_TABLET | Freq: Every day | ORAL | Status: DC
  Administered 2020-11-10: 40 mg via ORAL

## 2020-11-10 NOTE — Progress Notes (Signed)
   11/10/20 1445  Clinical Encounter Type  Visited With Patient  Visit Type Initial;Spiritual support;Social support  Referral From Chaplain  Consult/Referral To Chaplain   Posey Boyer visited Ms. Corti while doing rounds. Chaplain gave room for PT to express her emotions. Chaplain validated her feelings and further encouraged her to reach out to family members when she felt lonely, as she said they have previously asked her to. Chaplain offered supportive presence to help counter her sense of social isolation, and spiritual comfort through scripture and prayer to help sustain her sense of relationship with God.

## 2020-11-10 NOTE — Progress Notes (Deleted)
Patient ID: Bridget [name], female   DOB: April 03, 1926, 87 y.o.   MRN: [id number]   Acute hypoxic hypercapnic respiratory failure Bridget [name] presents with chronic hypercapnia associated with acute on chronic respiratory failure secondary to COPD. The use of the NIV will assist with hypercapnia and can reduce risk of exacerbations and future hospitalizations (three admissions year-to-date) when used at night and during the day. All alternate devices [phone] and [id number]) have been proven ineffective to provide essential volume control necessary to maintain acceptable CO2 levels. An NIV with AVAPS AE is necessary to prevent patient harm. Interruption or failure to provide NIV would quickly lead to exacerbation of the patient's condition, hospital admission, and likely harm to the patient. Continued use is preferred.  Dr Alford Highland

## 2020-11-10 NOTE — TOC Progression Note (Addendum)
Transition of Care [facility]) - Progression Note    Patient Details  Name: ARELLA BLINDER MRN: [id number] Date of Birth: Mar 24, 1926  Transition of Care [facility]) CM/SW Contact  Margarito Liner, LCSW Phone Number: 11/10/2020, 9:12 AM  Clinical Narrative: [employer] representative is reviewing chart in regards to NIV.    9:35 am: Spoke with daughter about [facility] recommendation. She is agreeable and said [facility] is first preference. Referral made to them for RN and PT and she was accepted. [employer] requesting ABG to show high PCO2 for NIV. Sent secure chat to MD to notify.  10:52 am: ABG is done, MD narrative is in, and NIV order form faxed to [employer]. [employer] representative said PCO2 is low but will try to run the insurance. He confirmed NIV would be delivered to the home today or tomorrow if approved by insurance.  Expected Discharge Plan: Home w Home Health Services Barriers to Discharge: Continued Medical Work up  Expected Discharge Plan and Services Expected Discharge Plan: Home w Home Health Services     Post Acute Care Choice: Home Health Living arrangements for the past 2 months: Single Family Home Expected Discharge Date: 11/10/20                                     Social Determinants of Health (SDOH) Interventions    Readmission Risk Interventions No flowsheet data found.

## 2020-11-10 NOTE — TOC Transition Note (Signed)
Transition of Care [facility]) - CM/SW Discharge Note   Patient Details  Name: Bridget [name] MRN: [id number] Date of Birth: 07-Sep-1925  Transition of Care [facility]) CM/SW Contact:  Margarito Liner, LCSW Phone Number: 11/10/2020, 1:19 PM   Clinical Narrative: Patient has orders to discharge home today. Insurance told [employer] she does not qualify for NIV because PCO2 is within normal limits. Have set up with nocturnal oxygen through [employer]. Daughter is aware. No further concerns. CSW signing off.  Final next level of care: Home w Home Health Services Barriers to Discharge: Barriers Resolved   Patient Goals and CMS Choice   CMS Medicare.gov Compare Post Acute Care list provided to:: Patient    Discharge Placement                Patient to be transferred to facility by: Daughter will take her home Name of family member notified: Dolores Hoose Patient and family notified of of transfer: 11/10/20  Discharge Plan and Services     Post Acute Care Choice: Home Health          DME Arranged: Oxygen DME Agency: [employer] Date DME Agency Contacted: 11/10/20   Representative spoke with at DME Agency: Wendall Papa [facility] Arranged: RN,PT HH Agency: [facility] Date [facility] Agency Contacted: 11/10/20   Representative spoke with at [facility] Agency: Lorenza Chick  Social Determinants of Health (SDOH) Interventions     Readmission Risk Interventions No flowsheet data found.

## 2020-11-10 NOTE — Progress Notes (Signed)
Patient to be discharged home.  piv removed with tip intact.  Discharge instructions reviewed with patient and sent in packet for daughter, [name].  [name] transported patient home.

## 2020-11-10 NOTE — Care Management Important Message (Signed)
Important Message  Patient Details  Name: Bridget [name] MRN: [id number] Date of Birth: Jun 15, 1925   Medicare Important Message Given:  Yes     Johnell Comings 11/10/2020, 11:33 AM

## 2020-11-10 NOTE — Discharge Instructions (Signed)

## 2020-11-10 NOTE — Discharge Summary (Signed)
[facility] [place] [facility]   PATIENT NAME: Bridget [name]    MR#:  [id number]  DATE OF BIRTH:  03-27-1926  DATE OF ADMISSION:  11/07/2020 ADMITTING PHYSICIAN: Lucile Shutters, MD  DATE OF DISCHARGE: 11/10/2020  PRIMARY CARE PHYSICIAN: Glori Bickers, MD    ADMISSION DIAGNOSIS:  Acute respiratory failure (HCC) [J96.00] COPD with acute exacerbation (HCC) [J44.1] Acute respiratory failure with hypoxia and hypercapnia (HCC) [J96.01, J96.02]  DISCHARGE DIAGNOSIS:  Principal Problem:   Acute respiratory failure (HCC) Active Problems:   Essential hypertension   COPD with acute exacerbation (HCC)   Atrial fibrillation, chronic (HCC)   CKD (chronic kidney disease), stage III (HCC)   Elevated troponin   CKD (chronic kidney disease), stage II   Chronic diastolic CHF (congestive heart failure) (HCC)   SECONDARY DIAGNOSIS:   Past Medical History:  Diagnosis Date  . (HFpEF) heart failure with preserved ejection fraction (HCC)    a. 08/2020 Echo: EF 60-65%, no rwma, Gr1 DD, nl RV fxn. Mildly dil RA. Mild MR/AI.  Marland Kitchen Asthma   . CKD (chronic kidney disease), stage III (HCC)   . COPD (chronic obstructive pulmonary disease) (HCC)   . Falls   . Hypertension   . Mild Dementia (HCC)   . Permanent atrial fibrillation (HCC)    a. CHA2DS2VASc = 5-->no OAC 2/2 unsteady gait/falls.  Waymon Budge gait     HOSPITAL COURSE:   1.  Acute hypoxic hypercarbic respiratory failure.  The patient required BiPAP on presentation.  The patient's pulse ox was initially 81% on room air.  Venous blood gas shows a PCO2 of 69 and a pH of 7.23.  When I did an ABG on the day of discharge PCO2 was now 43.  The patient no longer a candidate for noninvasive ventilation.  I did an overnight oximetry on room air and the patient did desaturate into the 80s on a few occasions and is a candidate for nocturnal oxygen only.  We will set up for nocturnal oxygen.  Patient off oxygen during the day. 2.   COPD exacerbation.  The patient was given IV Solu-Medrol during the hospitalization.  On the first day I saw her she had diffuse wheezing throughout the entire lung field.  On the day of discharge much better air entry and lungs were clear. 3.  Elevated troponin secondary to demand ischemia.  Cardiology started aspirin, bisoprolol and atorvastatin.  Troponin peaked at 601 and has come down to 348.  Echocardiogram shows a normal EF but high pulmonary pressures.  Follow-up with cardiology as outpatient. 4.  Chronic atrial fibrillation.  Patient not on long-term anticoagulation secondary to risk of falls.  Cardiology added bisoprolol 5.  Essential hypertension.  Blood pressure was on the lower side I discontinue hydrochlorothiazide and ACE inhibitor.  Cardiology was able to start bisoprolol.  Can go back on low-dose Lasix as outpatient.  Recommend checking a BMP and follow-up appointment 6.  Chronic kidney disease stage IIIa 7.  Chronic diastolic congestive heart failure.  No signs of heart failure on this hospital stay.  On bisoprolol and can go back on low-dose oral Lasix as outpatient.  DISCHARGE CONDITIONS:   Satisfactory  CONSULTS OBTAINED:  Cardiology  DRUG ALLERGIES:  No Known Allergies  DISCHARGE MEDICATIONS:   Allergies as of 11/10/2020   No Known Allergies     Medication List    STOP taking these medications   albuterol (2.5 MG/3ML) 0.083% nebulizer solution Commonly known as: PROVENTIL Replaced by: albuterol  108 (90 Base) MCG/ACT inhaler   Eliquis 2.5 MG Tabs tablet Generic drug: apixaban   ipratropium 0.02 % nebulizer solution Commonly known as: ATROVENT   lisinopril-hydrochlorothiazide 20-12.5 MG tablet Commonly known as: ZESTORETIC   mirtazapine 7.5 MG tablet Commonly known as: REMERON   omeprazole 20 MG capsule Commonly known as: PRILOSEC     TAKE these medications   albuterol 108 (90 Base) MCG/ACT inhaler Commonly known as: VENTOLIN HFA Inhale 2 puffs into  the lungs every 6 (six) hours as needed for wheezing or shortness of breath. Replaces: albuterol (2.5 MG/3ML) 0.083% nebulizer solution   aspirin 81 MG chewable tablet Chew 1 tablet (81 mg total) by mouth daily.   atorvastatin 10 MG tablet Commonly known as: LIPITOR Take 1 tablet (10 mg total) by mouth at bedtime.   bisoprolol 5 MG tablet Commonly known as: ZEBETA Take 1 tablet (5 mg total) by mouth daily.   furosemide 20 MG tablet Commonly known as: Lasix Take 1 tablet (20 mg total) by mouth daily.   ipratropium-albuterol 0.5-2.5 (3) MG/3ML Soln Commonly known as: DUONEB Take 3 mLs by nebulization 4 (four) times daily.   mometasone-formoterol 100-5 MCG/ACT Aero Commonly known as: DULERA Inhale 2 puffs into the lungs 2 (two) times daily.   predniSONE 20 MG tablet Commonly known as: DELTASONE Two tab po daily for two days   Spacer/Aero-Holding Rudean Curt 1 Device by Does not apply route 2 (two) times daily.            Durable Medical Equipment  (From admission, onward)         Start     Ordered   11/10/20 1239  For home use only DME oxygen  Once       Question Answer Comment  Length of Need Lifetime   Mode or (Route) Nasal cannula   Liters per Minute 2   Frequency Only at night (stationary unit needed)   Oxygen conserving device Yes   Oxygen delivery system Gas      11/10/20 1238           DISCHARGE INSTRUCTIONS:   Follow-up PMD 5 days Follow-up Dr. Mariah Milling cardiology  If you experience worsening of your admission symptoms, develop shortness of breath, life threatening emergency, suicidal or homicidal thoughts you must seek medical attention immediately by calling 911 or calling your MD immediately  if symptoms less severe.  You Must read complete instructions/literature along with all the possible adverse reactions/side effects for all the Medicines you take and that have been prescribed to you. Take any new Medicines after you have completely  understood and accept all the possible adverse reactions/side effects.   Please note  You were cared for by a hospitalist during your hospital stay. If you have any questions about your discharge medications or the care you received while you were in the hospital after you are discharged, you can call the unit and asked to speak with the hospitalist on call if the hospitalist that took care of you is not available. Once you are discharged, your primary care physician will handle any further medical issues. Please note that NO REFILLS for any discharge medications will be authorized once you are discharged, as it is imperative that you return to your primary care physician (or establish a relationship with a primary care physician if you do not have one) for your aftercare needs so that they can reassess your need for medications and monitor your lab values.    Today  CHIEF COMPLAINT:   Chief Complaint  Patient presents with  . Shortness of Breath    HISTORY OF PRESENT ILLNESS:  Bridget [name]  is a 87 y.o. female came in with acute hypoxic respiratory failure and shortness of breath   VITAL SIGNS:  Blood pressure 100/67, pulse (!) 51, temperature 97.9 F (36.6 C), temperature source Oral, resp. rate 16, height  (1.626 m), weight 56.7 kg, SpO2 94 %. Heart rate actually in the 70s on the monitor I/O:    Intake/Output Summary (Last 24 hours) at 11/10/2020 1844 Last data filed at 11/10/2020 1300 Gross per 24 hour  Intake 480 ml  Output --  Net 480 ml    PHYSICAL EXAMINATION:  GENERAL:  87 y.o.-year-old patient lying in the bed with no acute distress.  EYES: Pupils equal, round, reactive to light and accommodation. No scleral icterus.   HEENT: Head atraumatic, normocephalic. Oropharynx and nasopharynx clear.  LUNGS: Normal breath sounds bilaterally, no wheezing, rales,rhonchi or crepitation. No use of accessory muscles of respiration.  CARDIOVASCULAR: S1, S2 regular regular.   2-6 systolic murmurs.  ABDOMEN: Soft, non-tender, non-distended.  EXTREMITIES: Trace pedal edema.  NEUROLOGIC: Cranial nerves II through XII are intact. Muscle strength 5/5 in all extremities. Sensation intact. Gait not checked.  PSYCHIATRIC: The patient is alert and answers all questions appropriately.  SKIN: No obvious rash, lesion, or ulcer.   DATA REVIEW:   CBC Recent Labs  Lab 11/08/20 0501  WBC 10.7*  HGB 14.5  HCT 43.7  PLT 170    Chemistries  Recent Labs  Lab 11/07/20 0620 11/08/20 0501 11/09/20 0414  NA 138   < > 135  K 4.1   < > 4.5  CL 103   < > 99  CO2 26   < > 27  GLUCOSE 210*   < > 104*  BUN 20   < > 36*  CREATININE 0.88   < > 0.91  CALCIUM 9.2   < > 9.0  AST 48*  --   --   ALT 34  --   --   ALKPHOS 60  --   --   BILITOT 1.2  --   --    < > = values in this interval not displayed.    Microbiology Results  Results for orders placed or performed during the hospital encounter of 11/07/20  Resp Panel by RT-PCR (Flu A&B, Covid) Nasopharyngeal Swab     Status: None   Collection Time: 11/07/20  6:21 AM   Specimen: Nasopharyngeal Swab; Nasopharyngeal(NP) swabs in vial transport medium  Result Value Ref Range Status   SARS Coronavirus 2 by RT PCR NEGATIVE NEGATIVE Final    Comment: (NOTE) SARS-CoV-2 target nucleic acids are NOT DETECTED.  The SARS-CoV-2 RNA is generally detectable in upper respiratory specimens during the acute phase of infection. The lowest concentration of SARS-CoV-2 viral copies this assay can detect is 138 copies/mL. A negative result does not preclude SARS-Cov-2 infection and should not be used as the sole basis for treatment or other patient management decisions. A negative result may occur with  improper specimen collection/handling, submission of specimen other than nasopharyngeal swab, presence of viral mutation(s) within the areas targeted by this assay, and inadequate number of viral copies(<138 copies/mL). A negative result  must be combined with clinical observations, patient history, and epidemiological information. The expected result is Negative.  Fact Sheet for Patients:  BloggerCourse.com  Fact Sheet for Healthcare Providers:  SeriousBroker.it  This test is no  t yet approved or cleared by the [place] and  has been authorized for detection and/or diagnosis of SARS-CoV-2 by FDA under an Emergency Use Authorization (EUA). This EUA will remain  in effect (meaning this test can be used) for the duration of the COVID-19 declaration under Section 564(b)(1) of the Act, 21 U.S.C.section 360bbb-3(b)(1), unless the authorization is terminated  or revoked sooner.       Influenza A by PCR NEGATIVE NEGATIVE Final   Influenza B by PCR NEGATIVE NEGATIVE Final    Comment: (NOTE) The Xpert Xpress SARS-CoV-2/FLU/RSV plus assay is intended as an aid in the diagnosis of influenza from Nasopharyngeal swab specimens and should not be used as a sole basis for treatment. Nasal washings and aspirates are unacceptable for Xpert Xpress SARS-CoV-2/FLU/RSV testing.  Fact Sheet for Patients: BloggerCourse.com  Fact Sheet for Healthcare Providers: SeriousBroker.it  This test is not yet approved or cleared by the [place] FDA and has been authorized for detection and/or diagnosis of SARS-CoV-2 by FDA under an Emergency Use Authorization (EUA). This EUA will remain in effect (meaning this test can be used) for the duration of the COVID-19 declaration under Section 564(b)(1) of the Act, 21 U.S.C. section 360bbb-3(b)(1), unless the authorization is terminated or revoked.  Performed at [facility], [address] [place], [place] [address]     RADIOLOGY:  ECHOCARDIOGRAM LIMITED  Result Date: 11/09/2020    ECHOCARDIOGRAM LIMITED REPORT   Patient Name:   ROZELLE CAUDLE Date of Exam: 11/09/2020  Medical Rec #:  [id number]      Height:       64.0 in Accession #:    [id number]     Weight:       125.0 lb Date of Birth:  1926/02/15      BSA:          1.602 m Patient Age:    87 years       BP:           103/60 mmHg Patient Gender: F              HR:           77 bpm. Exam Location:  [facility] Procedure: 2D Echo Indications:     Dyspnea R06.00  History:         Patient has prior history of Echocardiogram examinations, most                  recent 08/31/2020.  Sonographer:     Wonda Cerise Referring Phys:  [id number] CHRISTOPHER RONALD BERGE Diagnosing Phys: Julien Nordmann MD IMPRESSIONS  1. Left ventricular ejection fraction, by estimation, is 55 %. The left ventricle has normal function. The left ventricle has no regional wall motion abnormalities.  2. Right ventricular systolic function is normal. The right ventricular size is mildly enlarged. There is moderately elevated pulmonary artery systolic pressure. The estimated right ventricular systolic pressure is 56.0 mmHg.  3. Left atrial size was mildly dilated.  4. Right atrial size was moderately dilated.  5. The mitral valve is normal in structure. Mild to moderate mitral valve regurgitation  6. The inferior vena cava is dilated in size with <50% respiratory variability, suggesting right atrial pressure of 15 mmHg. FINDINGS  Left Ventricle: Left ventricular ejection fraction, by estimation, is 55 %. The left ventricle has normal function. The left ventricle has no regional wall motion abnormalities. The left ventricular internal cavity size was normal in size. There is no  left ventricular hypertrophy. Right Ventricle: The right ventricular size is mildly enlarged. No increase in right ventricular wall thickness. Right ventricular systolic function is normal. There is moderately elevated pulmonary artery systolic pressure. The tricuspid regurgitant velocity is 3.20 m/s, and with an assumed right atrial pressure of 15 mmHg, the estimated right ventricular systolic  pressure is 56.0 mmHg. Left Atrium: Left atrial size was mildly dilated. Right Atrium: Right atrial size was moderately dilated. Pericardium: There is no evidence of pericardial effusion. Mitral Valve: The mitral valve is normal in structure. Mild to moderate mitral valve regurgitation. No evidence of mitral valve stenosis. Tricuspid Valve: The tricuspid valve is normal in structure. Tricuspid valve regurgitation is mild . No evidence of tricuspid stenosis. Aortic Valve: The aortic valve is normal in structure. Aortic valve regurgitation is not visualized. No aortic stenosis is present. Aortic valve peak gradient measures 5.0 mmHg. Pulmonic Valve: The pulmonic valve was normal in structure. Pulmonic valve regurgitation is not visualized. No evidence of pulmonic stenosis. Aorta: The aortic root is normal in size and structure. Venous: The inferior vena cava is dilated in size with less than 50% respiratory variability, suggesting right atrial pressure of 15 mmHg. IAS/Shunts: No atrial level shunt detected by color flow Doppler. LEFT VENTRICLE PLAX 2D LVIDd:         3.93 cm Diastology LVIDs:         2.64 cm LV e' medial:  5.00 cm/s LV PW:         1.07 cm LV e' lateral: 6.96 cm/s LV IVS:        1.19 cm  LEFT ATRIUM         Index      RIGHT ATRIUM           Index LA diam:    4.70 cm 2.93 cm/m RA Area:     24.80 cm                                RA Volume:   82.70 ml  51.62 ml/m  AORTIC VALVE AV Vmax:      112.00 cm/s AV Peak Grad: 5.0 mmHg TRICUSPID VALVE TV Peak grad:   38.7 mmHg TV Vmax:        3.11 m/s TR Peak grad:   41.0 mmHg TR Vmax:        320.00 cm/s Julien Nordmann MD Electronically signed by Julien Nordmann MD Signature Date/Time: 11/09/2020/12:23:50 PM    Final       Management plans discussed with the patient, family and they are in agreement.  CODE STATUS:     Code Status Orders  (From admission, onward)         Start     Ordered   11/07/20 0919  Full code  Continuous        11/07/20 0920         Code Status History    Date Active Date Inactive Code Status Order ID Comments User Context   08/31/2020 0832 09/04/2020 2051 DNR [id number]  Lucile Shutters, MD ED   08/31/2020 0816 08/31/2020 0832 DNR [id number]  Willy Eddy, MD ED   07/06/2020 0130 07/07/2020 2248 Full Code [id number]  Hillary Bow, DO Inpatient   Advance Care Planning Activity      TOTAL TIME TAKING CARE OF THIS PATIENT: 34 minutes.    Alford Highland M.D on 11/10/2020 at 6:44 PM    [facility]  CC: Primary care physician; Glori Bickersrivedi, Rajendra, MD

## 2020-11-10 NOTE — Progress Notes (Signed)
Mobility Specialist - Progress Note   11/10/20 1200  Mobility  Activity Ambulated in room  Level of Assistance Standby assist, set-up cues, supervision of patient - no hands on  Assistive Device Front wheel walker  Distance Ambulated (ft) 25 ft  Mobility Ambulated with assistance in room  Mobility Response Tolerated well  Mobility performed by Mobility specialist  $Mobility charge 1 Mobility    Pt sitting in recliner upon arrival. Pt ambulated in room with RW. No LOB. VC for initiating tasks, pt carries over well. Pt reports fatigue after activity and mild SOB. O2 98% on RA with HR of 65-75 bpm. Pt anticipating d/c later this date.    Filiberto Pinks Mobility Specialist 11/10/20, 12:03 PM

## 2020-11-13 ENCOUNTER — Inpatient Hospital Stay
Admission: EM | Admit: 2020-11-13 | Discharge: 2020-11-18 | DRG: 291 | Disposition: A | Discharge status: Home / Self Care | Payer: Medicare Other | Attending: Internal Medicine | Admitting: Internal Medicine

## 2020-11-13 ENCOUNTER — Emergency Department: Payer: Medicare Other

## 2020-11-13 ENCOUNTER — Other Ambulatory Visit: Payer: Self-pay

## 2020-11-13 DIAGNOSIS — I5033 Acute on chronic diastolic (congestive) heart failure: Secondary | ICD-10-CM | POA: Diagnosis present

## 2020-11-13 DIAGNOSIS — Z8616 Personal history of COVID-19: Secondary | ICD-10-CM | POA: Diagnosis not present

## 2020-11-13 DIAGNOSIS — E785 Hyperlipidemia, unspecified: Secondary | ICD-10-CM | POA: Diagnosis present

## 2020-11-13 DIAGNOSIS — R778 Other specified abnormalities of plasma proteins: Secondary | ICD-10-CM | POA: Diagnosis present

## 2020-11-13 DIAGNOSIS — J9622 Acute and chronic respiratory failure with hypercapnia: Secondary | ICD-10-CM | POA: Diagnosis present

## 2020-11-13 DIAGNOSIS — I10 Essential (primary) hypertension: Secondary | ICD-10-CM | POA: Diagnosis not present

## 2020-11-13 DIAGNOSIS — I130 Hypertensive heart and chronic kidney disease with heart failure and stage 1 through stage 4 chronic kidney disease, or unspecified chronic kidney disease: Secondary | ICD-10-CM | POA: Diagnosis not present

## 2020-11-13 DIAGNOSIS — I493 Ventricular premature depolarization: Secondary | ICD-10-CM | POA: Diagnosis present

## 2020-11-13 DIAGNOSIS — I4821 Permanent atrial fibrillation: Secondary | ICD-10-CM | POA: Diagnosis present

## 2020-11-13 DIAGNOSIS — J441 Chronic obstructive pulmonary disease with (acute) exacerbation: Secondary | ICD-10-CM | POA: Diagnosis present

## 2020-11-13 DIAGNOSIS — F0390 Unspecified dementia without behavioral disturbance: Secondary | ICD-10-CM | POA: Diagnosis present

## 2020-11-13 DIAGNOSIS — Z20822 Contact with and (suspected) exposure to covid-19: Secondary | ICD-10-CM | POA: Diagnosis present

## 2020-11-13 DIAGNOSIS — N1831 Chronic kidney disease, stage 3a: Secondary | ICD-10-CM | POA: Diagnosis present

## 2020-11-13 DIAGNOSIS — Z7952 Long term (current) use of systemic steroids: Secondary | ICD-10-CM | POA: Diagnosis not present

## 2020-11-13 DIAGNOSIS — J9621 Acute and chronic respiratory failure with hypoxia: Secondary | ICD-10-CM | POA: Diagnosis present

## 2020-11-13 DIAGNOSIS — Z79818 Long term (current) use of other agents affecting estrogen receptors and estrogen levels: Secondary | ICD-10-CM | POA: Diagnosis not present

## 2020-11-13 DIAGNOSIS — Z79899 Other long term (current) drug therapy: Secondary | ICD-10-CM | POA: Diagnosis not present

## 2020-11-13 DIAGNOSIS — R296 Repeated falls: Secondary | ICD-10-CM | POA: Diagnosis present

## 2020-11-13 DIAGNOSIS — Z7982 Long term (current) use of aspirin: Secondary | ICD-10-CM | POA: Diagnosis not present

## 2020-11-13 DIAGNOSIS — I2721 Secondary pulmonary arterial hypertension: Secondary | ICD-10-CM | POA: Diagnosis present

## 2020-11-13 DIAGNOSIS — R0602 Shortness of breath: Secondary | ICD-10-CM

## 2020-11-13 DIAGNOSIS — J9601 Acute respiratory failure with hypoxia: Secondary | ICD-10-CM

## 2020-11-13 DIAGNOSIS — Z7951 Long term (current) use of inhaled steroids: Secondary | ICD-10-CM | POA: Diagnosis not present

## 2020-11-13 DIAGNOSIS — R0902 Hypoxemia: Secondary | ICD-10-CM | POA: Diagnosis not present

## 2020-11-13 DIAGNOSIS — D72829 Elevated white blood cell count, unspecified: Secondary | ICD-10-CM | POA: Diagnosis present

## 2020-11-13 DIAGNOSIS — Z87891 Personal history of nicotine dependence: Secondary | ICD-10-CM

## 2020-11-13 LAB — CBC WITH DIFFERENTIAL/PLATELET
Abs Immature Granulocytes: 0.11 10*3/uL — ABNORMAL HIGH (ref 0.00–0.07)
Basophils Absolute: 0 10*3/uL (ref 0.0–0.1)
Basophils Relative: 0 %
Eosinophils Absolute: 0.1 10*3/uL (ref 0.0–0.5)
Eosinophils Relative: 1 %
HCT: 50.7 % — ABNORMAL HIGH (ref 36.0–46.0)
Hemoglobin: 16.9 g/dL — ABNORMAL HIGH (ref 12.0–15.0)
Immature Granulocytes: 1 %
Lymphocytes Relative: 12 %
Lymphs Abs: 1.6 10*3/uL (ref 0.7–4.0)
MCH: 31.4 pg (ref 26.0–34.0)
MCHC: 33.3 g/dL (ref 30.0–36.0)
MCV: 94.1 fL (ref 80.0–100.0)
Monocytes Absolute: 1.2 10*3/uL — ABNORMAL HIGH (ref 0.1–1.0)
Monocytes Relative: 9 %
Neutro Abs: 10.2 10*3/uL — ABNORMAL HIGH (ref 1.7–7.7)
Neutrophils Relative %: 77 %
Platelets: 260 10*3/uL (ref 150–400)
RBC: 5.39 MIL/uL — ABNORMAL HIGH (ref 3.87–5.11)
RDW: 13.9 % (ref 11.5–15.5)
WBC: 13.2 10*3/uL — ABNORMAL HIGH (ref 4.0–10.5)
nRBC: 0 % (ref 0.0–0.2)

## 2020-11-13 LAB — COMPREHENSIVE METABOLIC PANEL
ALT: 84 U/L — ABNORMAL HIGH (ref 0–44)
AST: 33 U/L (ref 15–41)
Albumin: 4 g/dL (ref 3.5–5.0)
Alkaline Phosphatase: 53 U/L (ref 38–126)
Anion gap: 12 (ref 5–15)
BUN: 34 mg/dL — ABNORMAL HIGH (ref 8–23)
CO2: 29 mmol/L (ref 22–32)
Calcium: 9.1 mg/dL (ref 8.9–10.3)
Chloride: 96 mmol/L — ABNORMAL LOW (ref 98–111)
Creatinine, Ser: 0.91 mg/dL (ref 0.44–1.00)
GFR, Estimated: 59 mL/min — ABNORMAL LOW (ref >60)
Glucose, Bld: 126 mg/dL — ABNORMAL HIGH (ref 70–99)
Potassium: 5 mmol/L (ref 3.5–5.1)
Sodium: 137 mmol/L (ref 135–145)
Total Bilirubin: 1.2 mg/dL (ref 0.3–1.2)
Total Protein: 7.2 g/dL (ref 6.5–8.1)

## 2020-11-13 LAB — TROPONIN I (HIGH SENSITIVITY): Troponin I (High Sensitivity): 164 ng/L (ref <18)

## 2020-11-13 LAB — BRAIN NATRIURETIC PEPTIDE: B Natriuretic Peptide: >4500 pg/mL — ABNORMAL HIGH (ref 0.0–100.0)

## 2020-11-13 MED ORDER — IPRATROPIUM-ALBUTEROL 0.5-2.5 (3) MG/3ML IN SOLN
6.0000 mL | Freq: Once | RESPIRATORY_TRACT | Status: AC
  Administered 2020-11-13: 6 mL via RESPIRATORY_TRACT
  Filled 2020-11-13: qty 9

## 2020-11-13 MED ORDER — METHYLPREDNISOLONE SODIUM SUCC 125 MG IJ SOLR
125.0000 mg | Freq: Once | INTRAMUSCULAR | Status: AC
  Administered 2020-11-13: 125 mg via INTRAVENOUS
  Filled 2020-11-13: qty 2

## 2020-11-13 MED ORDER — FUROSEMIDE 10 MG/ML IJ SOLN
40.0000 mg | Freq: Two times a day (BID) | INTRAMUSCULAR | Status: DC
  Administered 2020-11-14 – 2020-11-16 (×5): 40 mg via INTRAVENOUS
  Filled 2020-11-13 (×5): qty 4

## 2020-11-13 MED ORDER — ONDANSETRON HCL 4 MG PO TABS: 4.0000 mg | ORAL_TABLET | Freq: Four times a day (QID) | ORAL | Status: DC | PRN

## 2020-11-13 MED ORDER — SODIUM CHLORIDE 0.9 % IV SOLN
1.0000 g | INTRAVENOUS | Status: AC
  Administered 2020-11-14 – 2020-11-17 (×5): 1 g via INTRAVENOUS
  Filled 2020-11-13: qty 1
  Filled 2020-11-13: qty 10
  Filled 2020-11-13: qty 1
  Filled 2020-11-13 (×2): qty 10

## 2020-11-13 MED ORDER — FUROSEMIDE 10 MG/ML IJ SOLN
60.0000 mg | Freq: Once | INTRAMUSCULAR | Status: AC
  Administered 2020-11-13: 60 mg via INTRAVENOUS
  Filled 2020-11-13: qty 8

## 2020-11-13 MED ORDER — ONDANSETRON HCL 4 MG/2ML IJ SOLN: 4.0000 mg | Freq: Four times a day (QID) | INTRAMUSCULAR | Status: DC | PRN

## 2020-11-13 MED ORDER — MAGNESIUM HYDROXIDE 400 MG/5ML PO SUSP: 30.0000 mL | Freq: Every day | ORAL | Status: DC | PRN

## 2020-11-13 MED ORDER — GUAIFENESIN ER 600 MG PO TB12
600.0000 mg | ORAL_TABLET | Freq: Two times a day (BID) | ORAL | Status: DC
  Administered 2020-11-14 – 2020-11-18 (×9): 600 mg via ORAL
  Filled 2020-11-13 (×9): qty 1

## 2020-11-13 MED ORDER — METHYLPREDNISOLONE SODIUM SUCC 40 MG IJ SOLR
40.0000 mg | Freq: Three times a day (TID) | INTRAMUSCULAR | Status: DC
  Administered 2020-11-14: 40 mg via INTRAVENOUS
  Filled 2020-11-13 (×2): qty 1

## 2020-11-13 MED ORDER — ASPIRIN 81 MG PO CHEW
81.0000 mg | CHEWABLE_TABLET | Freq: Every day | ORAL | Status: DC
  Administered 2020-11-14 – 2020-11-18 (×5): 81 mg via ORAL
  Filled 2020-11-13 (×5): qty 1

## 2020-11-13 MED ORDER — PREDNISONE 20 MG PO TABS
40.0000 mg | ORAL_TABLET | Freq: Every day | ORAL | Status: DC
  Filled 2020-11-13: qty 2

## 2020-11-13 MED ORDER — IPRATROPIUM-ALBUTEROL 0.5-2.5 (3) MG/3ML IN SOLN
3.0000 mL | Freq: Four times a day (QID) | RESPIRATORY_TRACT | Status: DC
  Administered 2020-11-14 – 2020-11-15 (×5): 3 mL via RESPIRATORY_TRACT
  Filled 2020-11-13 (×5): qty 3

## 2020-11-13 MED ORDER — ACETAMINOPHEN 650 MG RE SUPP: 650.0000 mg | Freq: Four times a day (QID) | RECTAL | Status: DC | PRN

## 2020-11-13 MED ORDER — HYDROCOD POLST-CPM POLST ER 10-8 MG/5ML PO SUER: 5.0000 mL | Freq: Two times a day (BID) | ORAL | Status: DC | PRN

## 2020-11-13 MED ORDER — ENOXAPARIN SODIUM 40 MG/0.4ML IJ SOSY
40.0000 mg | PREFILLED_SYRINGE | INTRAMUSCULAR | Status: DC
  Administered 2020-11-14 – 2020-11-15 (×3): 40 mg via SUBCUTANEOUS
  Filled 2020-11-13 (×3): qty 0.4

## 2020-11-13 MED ORDER — ATORVASTATIN CALCIUM 10 MG PO TABS
10.0000 mg | ORAL_TABLET | Freq: Every day | ORAL | Status: DC
  Administered 2020-11-14 – 2020-11-17 (×4): 10 mg via ORAL
  Filled 2020-11-13 (×4): qty 1

## 2020-11-13 MED ORDER — ACETAMINOPHEN 325 MG PO TABS: 650.0000 mg | ORAL_TABLET | Freq: Four times a day (QID) | ORAL | Status: DC | PRN

## 2020-11-13 MED ORDER — TRAZODONE HCL 50 MG PO TABS
25.0000 mg | ORAL_TABLET | Freq: Every evening | ORAL | Status: DC | PRN
Start: 2020-11-13 — End: 2020-11-18

## 2020-11-13 MED ORDER — BISOPROLOL FUMARATE 5 MG PO TABS
5.0000 mg | ORAL_TABLET | Freq: Every day | ORAL | Status: DC
  Administered 2020-11-14 – 2020-11-18 (×5): 5 mg via ORAL
  Filled 2020-11-13 (×5): qty 1

## 2020-11-13 NOTE — ED Notes (Signed)
Coarse crackles noted bilaterally. Pt states she still feels "bad." Pt and daughter requesting covid test, EDP notified.

## 2020-11-13 NOTE — ED Notes (Signed)
Dr. Joylene Igo bedside

## 2020-11-13 NOTE — ED Notes (Signed)
Pt SOB at rest. Audible wheezing noted. EDP at bedside.

## 2020-11-13 NOTE — ED Triage Notes (Signed)
Pt presents to ER c/o sob that started yesterday and was significantly worse this morning.  Pt saw PT and her O2 on exertion was 89%.  Pt was dc'd from this facility Tuesday for copd exacerbation.  Pt having difficulty saying full sentences.  O2 89% on RA at this time. Pt A&O x3 at this time.

## 2020-11-13 NOTE — H&P (Addendum)
Chest     [place]   PATIENT NAME: Bridget [name]    MR#:  [id number]  DATE OF BIRTH:  1926/01/07  DATE OF ADMISSION:  11/13/2020  PRIMARY CARE PHYSICIAN: Glori Bickers, MD   Patient is coming from: Home.  REQUESTING/REFERRING PHYSICIAN: Delton Prairie, MD.  CHIEF COMPLAINT:   Chief Complaint  Patient presents with  . Shortness of Breath    HISTORY OF PRESENT ILLNESS:  Bridget [name] is a 87 y.o. Caucasian female with medical history significant for heart failure with preserved ejection fraction, asthma/COPD, stage III chronic kidney disease, hypertension and mild dementia as well as permanent atrial fibrillation, not on anticoagulation due to fall risk, who was just admitted here for acute CHF exacerbation and COPD exacerbation from 5/28-5/31 and presents today to the emergency room with acute onset of worsening dyspnea with associated cough and wheezing over the last couple of days not significantly worse since yesterday.  No fever or chills.  No nausea vomiting or abdominal pain.  No chest pain or palpitations.  Since this morning she has been using more nebulizer treatment.  She denies any dysuria, oliguria or hematuria or flank pain.  She was supposed to get oxygen only on a as needed basis at home and mainly at night. ED Course: Upon presentation to the ER blood pressure was 158/101 with respiratory to 22, temperature 97.5 and pulse oximetry 89% on room air.  Labs revealed CMP remarkable for potassium of 5 and a BN of 34 with chloride 96 total bili of 1.2 and ALT of 84.  BNP was more than 4500 and high-sensitivity troponin I was 164 compared to 348 on 5/30.  CBC showed leukocytosis 13.2 and hemoconcentration as well as neutrophilia.   EKG as reviewed by me : Showed atrial fibrillation with controlled ventricular sponsor of 80, right axis deviation with Q waves anteroseptally. Imaging: Chest x-ray showed COPD and cardiomegaly without acute changes.  The patient was given 60 mg of  IV Lasix, 125 mg of IV Solu-Medrol and DuoNeb.  She will be admitted to a progressive cardiac unit bed for further evaluation and management. PAST MEDICAL HISTORY:   Past Medical History:  Diagnosis Date  . (HFpEF) heart failure with preserved ejection fraction (HCC)    a. 08/2020 Echo: EF 60-65%, no rwma, Gr1 DD, nl RV fxn. Mildly dil RA. Mild MR/AI.  Marland Kitchen Asthma   . CKD (chronic kidney disease), stage III (HCC)   . COPD (chronic obstructive pulmonary disease) (HCC)   . Falls   . Hypertension   . Mild Dementia (HCC)   . Permanent atrial fibrillation (HCC)    a. CHA2DS2VASc = 5-->no OAC 2/2 unsteady gait/falls.  Waymon Budge gait     PAST SURGICAL HISTORY:   Past Surgical History:  Procedure Laterality Date  . APPENDECTOMY    . BREAST SURGERY    . HERNIA REPAIR      SOCIAL HISTORY:   Social History   Tobacco Use  . Smoking status: Former Games developer  . Smokeless tobacco: Never Used  Substance Use Topics  . Alcohol use: Never    FAMILY HISTORY:   Family History  Problem Relation Age of Onset  . Immunocompromised Neg Hx     DRUG ALLERGIES:  No Known Allergies  REVIEW OF SYSTEMS:   ROS As per history of present illness. All pertinent systems were reviewed above. Constitutional, HEENT, cardiovascular, respiratory, GI, GU, musculoskeletal, neuro, psychiatric, endocrine, integumentary and hematologic systems were reviewed and are otherwise negative/unremarkable  except for positive findings mentioned above in the HPI.   MEDICATIONS AT HOME:   Prior to Admission medications   Medication Sig Start Date End Date Taking? Authorizing Provider  albuterol (VENTOLIN HFA) 108 (90 Base) MCG/ACT inhaler Inhale 2 puffs into the lungs every 6 (six) hours as needed for wheezing or shortness of breath. 11/10/20   Alford Highland, MD  aspirin 81 MG chewable tablet Chew 1 tablet (81 mg total) by mouth daily. 11/10/20   Alford Highland, MD  atorvastatin (LIPITOR) 10 MG tablet Take 1 tablet  (10 mg total) by mouth at bedtime. 11/10/20   Alford Highland, MD  bisoprolol (ZEBETA) 5 MG tablet Take 1 tablet (5 mg total) by mouth daily. 11/10/20   Alford Highland, MD  furosemide (LASIX) 20 MG tablet Take 1 tablet (20 mg total) by mouth daily. 11/10/20 12/10/20  Alford Highland, MD  ipratropium-albuterol (DUONEB) 0.5-2.5 (3) MG/3ML SOLN Take 3 mLs by nebulization 4 (four) times daily. 11/10/20   Alford Highland, MD  mometasone-formoterol (DULERA) 100-5 MCG/ACT AERO Inhale 2 puffs into the lungs 2 (two) times daily. 11/10/20   Alford Highland, MD  predniSONE (DELTASONE) 20 MG tablet Two tab po daily for two days 11/10/20   Alford Highland, MD  Spacer/Aero-Holding Chambers DEVI 1 Device by Does not apply route 2 (two) times daily. 11/10/20   Alford Highland, MD      VITAL SIGNS:  Blood pressure (!) 145/75, pulse 96, temperature (!) 97.5 F (36.4 C), temperature source Oral, resp. rate 17, height 5\' 4"  (1.626 m), weight 61.2 kg, SpO2 98 %.  PHYSICAL EXAMINATION:  Physical Exam  GENERAL:  87 y.o.-year-old Caucasian female patient semi-lying in the bed with mild respiratory distress with conversational dyspnea.  EYES: Pupils equal, round, reactive to light and accommodation. No scleral icterus. Extraocular muscles intact.  HEENT: Head atraumatic, normocephalic. Oropharynx and nasopharynx clear.  NECK:  Supple, no jugular venous distention. No thyroid enlargement, no tenderness.  LUNGS: Diffuse expiratory wheezes with tight expiratory airflow and heart physical breathing.  She had mild diminished bibasal breath sounds. CARDIOVASCULAR: Regular rate and rhythm, S1, S2 normal. No murmurs, rubs, or gallops.  ABDOMEN: Soft, nondistended, nontender. Bowel sounds present. No organomegaly or mass.  EXTREMITIES: Trace bilateral lower extremity pitting edema with no cyanosis, or clubbing.  NEUROLOGIC: Cranial nerves II through XII are intact. Muscle strength 5/5 in all extremities. Sensation intact.  Gait not checked.  PSYCHIATRIC: The patient is alert and oriented x 3.  Normal affect and good eye contact. SKIN: No obvious rash, lesion, or ulcer.   LABORATORY PANEL:   CBC Recent Labs  Lab 11/13/20 2105  WBC 13.2*  HGB 16.9*  HCT 50.7*  PLT 260   ------------------------------------------------------------------------------------------------------------------  Chemistries  Recent Labs  Lab 11/13/20 2105  NA 137  K 5.0  CL 96*  CO2 29  GLUCOSE 126*  BUN 34*  CREATININE 0.91  CALCIUM 9.1  AST 33  ALT 84*  ALKPHOS 53  BILITOT 1.2   ------------------------------------------------------------------------------------------------------------------  Cardiac Enzymes No results for input(s): TROPONINI in the last 168 hours. ------------------------------------------------------------------------------------------------------------------  RADIOLOGY:  DG Chest Portable 1 View  Result Date: 11/13/2020 CLINICAL DATA:  COPD exacerbation. EXAM: PORTABLE CHEST 1 VIEW COMPARISON:  11/07/2020 FINDINGS: Cardiomegaly. Aortic atherosclerosis. Biapical scarring. No acute confluent opacities or effusions. Mild hyperinflation. No acute bony abnormality. IMPRESSION: Cardiomegaly, COPD.  Chronic changes.  No active disease. Electronically Signed   By: 11/09/2020 M.D.   On: 11/13/2020 21:23  IMPRESSION AND PLAN:  Active Problems:   Acute on chronic diastolic (congestive) heart failure (HCC)  1.  Acute hypoxic respiratory failure secondary to acute on chronic diastolic CHF and COPD acute exacerbation. - The patient will be admitted to a progressive cardiac unit bed. - We will diurese with IV Lasix. - We will follow troponin I's given elevated troponin that is likely secondary to acute CHF. - We will continue steroid therapy with IV Solu-Medrol. - We will continue bronchodilator therapy with DuoNebs 4 times daily and every 4 hours as needed. - We will add IV antibiotic therapy  with Rocephin. - Mucolytic therapy will be provided. - We will check sputum culture. - We will hold off long-acting beta agonist during COPD exacerbation.  2.  Essential hypertension. - We will continue Zebeta.  3.  Dyslipidemia. - We will continue statin therapy.  4.  Chronic atrial fibrillation with controlled ventricular response. - We will continue beta-blocker therapy. - The patient is not on anticoagulation due to fall risk.  DVT prophylaxis: Lovenox. Code Status: The patient is DO NOT INTUBATE only. Family Communication:  The plan of care was discussed in details with the patient (and family). I answered all questions. The patient agreed to proceed with the above mentioned plan. Further management will depend upon hospital course. Disposition Plan: Back to previous home environment Consults called: Cardiology consult. All the records are reviewed and case discussed with ED provider.  Status is: Inpatient  Remains inpatient appropriate because:Hemodynamically unstable, Ongoing diagnostic testing needed not appropriate for outpatient work up, Unsafe d/c plan, IV treatments appropriate due to intensity of illness or inability to take PO and Inpatient level of care appropriate due to severity of illness   Dispo: The patient is from: Home              Anticipated d/c is to: Home              Patient currently is not medically stable to d/c.   Difficult to place patient No  TOTAL TIME TAKING CARE OF THIS PATIENT: 55 minutes.    Hannah Beat M.D on 11/13/2020 at 10:31 PM  [facility]   From 7 PM-7 AM, contact night-coverage [email or web address]  CC: Primary care physician; Glori Bickers, MD

## 2020-11-13 NOTE — ED Provider Notes (Signed)
[facility] Emergency Department Provider Note ____________________________________________   Event Date/Time   First MD Initiated Contact with Patient 11/13/20 2051     (approximate)  I have reviewed the triage vital signs and the nursing notes.  HISTORY  Chief Complaint Shortness of Breath   HPI Bridget [name] is a 87 y.o. femalewho presents to the ED for evaluation of SOB.   Chart review indicates history of COPD, CKD, HTN and atrial fibrillation not on AC due to recurrent falls.  Recent admission 5/28-5/31 for COPD exacerbation requiring BiPAP on presentation.  Discharged with 20 mg prednisone for the next couple days.   Patient returns to the ED for evaluation of worsening shortness of breath over the past 24 hours or so.  She reports feeling okay yesterday, and then worsening this morning and has been progressively worsening throughout the day today with her respiratory status.  She denies cough, increased sputum production, fever, chest pain, syncopal episodes or falls.  Reporting shortness of breath, feeling better when sitting upright and worse when laying flat.  Past Medical History:  Diagnosis Date  . (HFpEF) heart failure with preserved ejection fraction (HCC)    a. 08/2020 Echo: EF 60-65%, no rwma, Gr1 DD, nl RV fxn. Mildly dil RA. Mild MR/AI.  Marland Kitchen Asthma   . CKD (chronic kidney disease), stage III (HCC)   . COPD (chronic obstructive pulmonary disease) (HCC)   . Falls   . Hypertension   . Mild Dementia (HCC)   . Permanent atrial fibrillation (HCC)    a. CHA2DS2VASc = 5-->no OAC 2/2 unsteady gait/falls.  Waymon Budge gait     Patient Active Problem List   Diagnosis Date Noted  . Acute on chronic diastolic (congestive) heart failure (HCC) 11/13/2020  . CKD (chronic kidney disease), stage II   . Chronic diastolic CHF (congestive heart failure) (HCC)   . Acute respiratory failure (HCC) 11/07/2020  . Elevated troponin 11/07/2020  . COPD with  acute exacerbation (HCC) 08/31/2020  . Atrial fibrillation, chronic (HCC) 08/31/2020  . Acute CHF (congestive heart failure) (HCC) 08/31/2020  . CKD (chronic kidney disease), stage III (HCC) 08/31/2020  . Essential hypertension 07/06/2020  . Acute lower UTI 07/06/2020  . Acute respiratory disease due to COVID-19 virus 07/05/2020    Past Surgical History:  Procedure Laterality Date  . APPENDECTOMY    . BREAST SURGERY    . HERNIA REPAIR      Prior to Admission medications   Medication Sig Start Date End Date Taking? Authorizing Provider  albuterol (VENTOLIN HFA) 108 (90 Base) MCG/ACT inhaler Inhale 2 puffs into the lungs every 6 (six) hours as needed for wheezing or shortness of breath. 11/10/20   Alford Highland, MD  aspirin 81 MG chewable tablet Chew 1 tablet (81 mg total) by mouth daily. 11/10/20   Alford Highland, MD  atorvastatin (LIPITOR) 10 MG tablet Take 1 tablet (10 mg total) by mouth at bedtime. 11/10/20   Alford Highland, MD  bisoprolol (ZEBETA) 5 MG tablet Take 1 tablet (5 mg total) by mouth daily. 11/10/20   Alford Highland, MD  furosemide (LASIX) 20 MG tablet Take 1 tablet (20 mg total) by mouth daily. 11/10/20 12/10/20  Alford Highland, MD  ipratropium-albuterol (DUONEB) 0.5-2.5 (3) MG/3ML SOLN Take 3 mLs by nebulization 4 (four) times daily. 11/10/20   Alford Highland, MD  mometasone-formoterol (DULERA) 100-5 MCG/ACT AERO Inhale 2 puffs into the lungs 2 (two) times daily. 11/10/20   Alford Highland, MD  predniSONE (DELTASONE)  20 MG tablet Two tab po daily for two days 11/10/20   Alford Highland, MD  Spacer/Aero-Holding Chambers DEVI 1 Device by Does not apply route 2 (two) times daily. 11/10/20   Alford Highland, MD    Allergies Patient has no known allergies.  Family History  Problem Relation Age of Onset  . Immunocompromised Neg Hx     Social History Social History   Tobacco Use  . Smoking status: Former Games developer  . Smokeless tobacco: Never Used  Substance Use  Topics  . Alcohol use: Never  . Drug use: Never    Review of Systems  Constitutional: No fever/chills Eyes: No visual changes. ENT: No sore throat. Cardiovascular: Denies chest pain. Respiratory: Positive for shortness of breath and orthopnea Gastrointestinal: No abdominal pain.  No nausea, no vomiting.  No diarrhea.  No constipation. Genitourinary: Negative for dysuria. Musculoskeletal: Negative for back pain. Skin: Negative for rash. Neurological: Negative for headaches, focal weakness or numbness.  ____________________________________________   PHYSICAL EXAM:  VITAL SIGNS: Vitals:   11/13/20 2135 11/13/20 2200  BP:  (!) 145/75  Pulse: 82 96  Resp: 16 17  Temp:    SpO2: 99% 98%     Constitutional: Alert and oriented.  Sitting upright in bed.  Pleasant  Eyes: Conjunctivae are normal. PERRL. EOMI. Head: Atraumatic. Nose: No congestion/rhinnorhea. Mouth/Throat: Mucous membranes are moist.  Oropharynx non-erythematous. Neck: No stridor. No cervical spine tenderness to palpation. Cardiovascular: Normal rate, regular rhythm. Grossly normal heart sounds.  Good peripheral circulation. Respiratory: Tachypneic to the mid 20s.  No retractions.  Diffuse expiratory wheezes throughout with decreased airflow.  Bibasilar crackles are present. Gastrointestinal: Soft , nondistended, nontender to palpation. No CVA tenderness. Musculoskeletal: No lower extremity tenderness.  No joint effusions. No signs of acute trauma. Trace pitting edema bilaterally Neurologic:  Normal speech and language. No gross focal neurologic deficits are appreciated. No gait instability noted. Skin:  Skin is warm, dry and intact. Marland Kitchen Psychiatric: Mood and affect are normal. Speech and behavior are normal.  ____________________________________________   LABS (all labs ordered are listed, but only abnormal results are displayed)  Labs Reviewed  COMPREHENSIVE METABOLIC PANEL - Abnormal; Notable for the  following components:      Result Value   Chloride 96 (*)    Glucose, Bld 126 (*)    BUN 34 (*)    ALT 84 (*)    GFR, Estimated 59 (*)    All other components within normal limits  CBC WITH DIFFERENTIAL/PLATELET - Abnormal; Notable for the following components:   WBC 13.2 (*)    RBC 5.39 (*)    Hemoglobin 16.9 (*)    HCT 50.7 (*)    Neutro Abs 10.2 (*)    Monocytes Absolute 1.2 (*)    Abs Immature Granulocytes 0.11 (*)    All other components within normal limits  BRAIN NATRIURETIC PEPTIDE - Abnormal; Notable for the following components:   B Natriuretic Peptide >4,500.0 (*)    All other components within normal limits  TROPONIN I (HIGH SENSITIVITY) - Abnormal; Notable for the following components:   Troponin I (High Sensitivity) 164 (*)    All other components within normal limits   ____________________________________________  12 Lead EKG  Atrial fibrillation with a rate of 80 bpm.  Rightward axis and normal intervals.  Shivering/poor baseline.  No clear evidence of acute ischemia. ____________________________________________  RADIOLOGY  ED MD interpretation: 1 view CXR with cardiomegaly and hyperinflation without lobar infiltration  Official radiology report(s): DG Chest  Portable 1 View  Result Date: 11/13/2020 CLINICAL DATA:  COPD exacerbation. EXAM: PORTABLE CHEST 1 VIEW COMPARISON:  11/07/2020 FINDINGS: Cardiomegaly. Aortic atherosclerosis. Biapical scarring. No acute confluent opacities or effusions. Mild hyperinflation. No acute bony abnormality. IMPRESSION: Cardiomegaly, COPD.  Chronic changes.  No active disease. Electronically Signed   By: Charlett Nose M.D.   On: 11/13/2020 21:23    ____________________________________________   PROCEDURES and INTERVENTIONS  Procedure(s) performed (including Critical Care):  .1-3 Lead EKG Interpretation Performed by: Delton Prairie, MD Authorized by: Delton Prairie, MD     Interpretation: normal     ECG rate:  90   ECG rate  assessment: normal     Rhythm: sinus rhythm     Ectopy: none     Conduction: normal      Medications  furosemide (LASIX) injection 60 mg (has no administration in time range)  ipratropium-albuterol (DUONEB) 0.5-2.5 (3) MG/3ML nebulizer solution 6 mL (6 mLs Nebulization Given 11/13/20 2130)  methylPREDNISolone sodium succinate (SOLU-MEDROL) 125 mg/2 mL injection 125 mg (125 mg Intravenous Given 11/13/20 2130)    ____________________________________________   MDM / ED COURSE   Pleasant 87 year old woman presents to the ED short of breath with evidence of COPD and CHF exacerbations, requiring medical admission.  Mildly hypoxic to the upper 80s, but otherwise stable.  Exam with stigmata of COPD exacerbation with diffuse expiratory wheezes and poor airflow.  Orthopnea is present and she prefers to be sitting upright.  No fevers or signs of infectious etiology of her symptoms.  No infiltrates on x-ray.  BNP is remarkably elevated indicating a strong component of CHF.  Some improvement with breathing treatments.  Suspect combination of the 2.  EKG is nonischemic without evidence of ACS.  Her troponins are downtrending from her recent admission she has had no chest pain, we will trend these.  Admitted to hospitalist for further work-up and management      ____________________________________________   FINAL CLINICAL IMPRESSION(S) / ED DIAGNOSES  Final diagnoses:  Acute on chronic diastolic congestive heart failure (HCC)  COPD exacerbation (HCC)  Hypoxia  SOB (shortness of breath)     ED Discharge Orders    None       Iona Stay   Note:  This document was prepared using Conservation officer, historic buildings and may include unintentional dictation errors.   Delton Prairie, MD 11/13/20 2222

## 2020-11-14 DIAGNOSIS — J9621 Acute and chronic respiratory failure with hypoxia: Secondary | ICD-10-CM

## 2020-11-14 DIAGNOSIS — J9622 Acute and chronic respiratory failure with hypercapnia: Secondary | ICD-10-CM

## 2020-11-14 DIAGNOSIS — R778 Other specified abnormalities of plasma proteins: Secondary | ICD-10-CM

## 2020-11-14 LAB — CBC
HCT: 51.2 % — ABNORMAL HIGH (ref 36.0–46.0)
Hemoglobin: 16.9 g/dL — ABNORMAL HIGH (ref 12.0–15.0)
MCH: 31.2 pg (ref 26.0–34.0)
MCHC: 33 g/dL (ref 30.0–36.0)
MCV: 94.6 fL (ref 80.0–100.0)
Platelets: 237 10*3/uL (ref 150–400)
RBC: 5.41 MIL/uL — ABNORMAL HIGH (ref 3.87–5.11)
RDW: 14.2 % (ref 11.5–15.5)
WBC: 9.9 10*3/uL (ref 4.0–10.5)
nRBC: 0 % (ref 0.0–0.2)

## 2020-11-14 LAB — TROPONIN I (HIGH SENSITIVITY)
Troponin I (High Sensitivity): 136 ng/L (ref <18)
Troponin I (High Sensitivity): 92 ng/L — ABNORMAL HIGH (ref <18)

## 2020-11-14 LAB — RESP PANEL BY RT-PCR (FLU A&B, COVID) ARPGX2
Influenza A by PCR: NEGATIVE
Influenza B by PCR: NEGATIVE
SARS Coronavirus 2 by RT PCR: NEGATIVE

## 2020-11-14 LAB — BASIC METABOLIC PANEL
Anion gap: 13 (ref 5–15)
BUN: 32 mg/dL — ABNORMAL HIGH (ref 8–23)
CO2: 32 mmol/L (ref 22–32)
Calcium: 8.7 mg/dL — ABNORMAL LOW (ref 8.9–10.3)
Chloride: 92 mmol/L — ABNORMAL LOW (ref 98–111)
Creatinine, Ser: 0.87 mg/dL (ref 0.44–1.00)
GFR, Estimated: >60 mL/min (ref >60)
Glucose, Bld: 166 mg/dL — ABNORMAL HIGH (ref 70–99)
Potassium: 3.9 mmol/L (ref 3.5–5.1)
Sodium: 137 mmol/L (ref 135–145)

## 2020-11-14 NOTE — Progress Notes (Signed)
PROGRESS NOTE  Bridget [name] YSA:[id number] DOB: Dec 25, 1925 DOA: 11/13/2020 PCP: Glori Bickers, MD  HPI/Recap of past 24 hours: Patient seen and examined at bedside in the emergency department.  Patient denies any new complain stated that she is still feels quite weak but does not feel sick   Assessment/Plan: Active Problems:   Acute on chronic diastolic (congestive) heart failure (HCC)  Heart failure with preserved ejection fraction continue diuresis Continue daily weights Strict in and out  Permanent atrial fibrillation continue beta-blocker therapy.  He is she is not on anticoagulation due to fall risk  Chronic hypoxic Hypoxia respiratory failure on supplemental oxygen secondary to COPD and asthma.  Her O2 saturation on presentation to the emergency room was 88% on room air.  Continue oxygen supplementation, IV Solu-Medrol albuterol nebulizer treatment and antibiotics  Elevated troponin most likely due to demand ischemia from CHF exacerbation  Mild dementia  Hypertension.  Continues home med Dyslipidemia continue statin therapy Code Status: Partial code  Severity of Illness: The appropriate patient status for this patient is INPATIENT. Inpatient status is judged to be reasonable and necessary in order to provide the required intensity of service to ensure the patient's safety. The patient's presenting symptoms, physical exam findings, and initial radiographic and laboratory data in the context of their chronic comorbidities is felt to place them at high risk for further clinical deterioration. Furthermore, it is not anticipated that the patient will be medically stable for discharge from the hospital within 2 midnights of admission. The following factors support the patient status of inpatient.   " Congestive heart failure  * I certify that at the point of admission it is my clinical judgment that the patient will require inpatient hospital care spanning beyond 2 midnights  from the point of admission due to high intensity of service, high risk for further deterioration and high frequency of surveillance required.*    Family Communication: Patient  Disposition Plan: Home when stable   Consultants:  Cardiology  Procedures:  None  Antimicrobials:  Ceftriaxone      DVT prophylaxis: Lovenox   Objective: Vitals:   11/14/20 1100 11/14/20 1130 11/14/20 1200 11/14/20 1208  BP: 105/67 107/64 (!) 101/55   Pulse: 63 (!) 58    Resp: 20 16 18    Temp:    97.9 F (36.6 C)  TempSrc:    Oral  SpO2: 97% 97% 95%   Weight:      Height:        Intake/Output Summary (Last 24 hours) at 11/14/2020 1728 Last data filed at 11/14/2020 0554 Gross per 24 hour  Intake --  Output 1300 ml  Net -1300 ml   Filed Weights   11/13/20 2042  Weight: 61.2 kg   Body mass index is 23.17 kg/m.  Exam:  . General: 87 y.o. year-old female well developed well nourished in no acute distress.  Alert and oriented x3. . Cardiovascular: Regular rate and rhythm with no rubs or gallops.  No thyromegaly or JVD noted.   85 Respiratory: Clear to auscultation with no wheezes or rales. Good inspiratory effort. . Abdomen: Soft nontender nondistended with normal bowel sounds x4 quadrants. . Musculoskeletal: No lower extremity edema. 2/4 pulses in all 4 extremities. . Skin: No ulcerative lesions noted or rashes, . Psychiatry: Mood is appropriate for condition and setting    Data Reviewed: CBC: Recent Labs  Lab 11/08/20 0501 11/13/20 2105 11/14/20 0546  WBC 10.7* 13.2* 9.9  NEUTROABS  --  10.2*  --  HGB 14.5 16.9* 16.9*  HCT 43.7 50.7* 51.2*  MCV 93.6 94.1 94.6  PLT 170 260 237   Basic Metabolic Panel: Recent Labs  Lab 11/08/20 0501 11/09/20 0414 11/13/20 2105 11/14/20 0709  NA 136 135 137 137  K 4.4 4.5 5.0 3.9  CL 103 99 96* 92*  CO2 26 27 29  32  GLUCOSE 132* 104* 126* 166*  BUN 21 36* 34* 32*  CREATININE 0.83 0.91 0.91 0.87  CALCIUM 9.0 9.0 9.1 8.7*    GFR: Estimated Creatinine Clearance: 34.9 mL/min (by C-G formula based on SCr of 0.87 mg/dL). Liver Function Tests: Recent Labs  Lab 11/13/20 2105  AST 33  ALT 84*  ALKPHOS 53  BILITOT 1.2  PROT 7.2  ALBUMIN 4.0   No results for input(s): LIPASE, AMYLASE in the last 168 hours. No results for input(s): AMMONIA in the last 168 hours. Coagulation Profile: No results for input(s): INR, PROTIME in the last 168 hours. Cardiac Enzymes: No results for input(s): CKTOTAL, CKMB, CKMBINDEX, TROPONINI in the last 168 hours. BNP (last 3 results) No results for input(s): PROBNP in the last 8760 hours. HbA1C: No results for input(s): HGBA1C in the last 72 hours. CBG: No results for input(s): GLUCAP in the last 168 hours. Lipid Profile: No results for input(s): CHOL, HDL, LDLCALC, TRIG, CHOLHDL, LDLDIRECT in the last 72 hours. Thyroid Function Tests: No results for input(s): TSH, T4TOTAL, FREET4, T3FREE, THYROIDAB in the last 72 hours. Anemia Panel: No results for input(s): VITAMINB12, FOLATE, FERRITIN, TIBC, IRON, RETICCTPCT in the last 72 hours. Urine analysis:    Component Value Date/Time   COLORURINE YELLOW 07/05/2020 1232   APPEARANCEUR CLOUDY (A) 07/05/2020 1232   LABSPEC 1.030 07/05/2020 1232   PHURINE 5.0 07/05/2020 1232   GLUCOSEU NEGATIVE 07/05/2020 1232   HGBUR SMALL (A) 07/05/2020 1232   BILIRUBINUR NEGATIVE 07/05/2020 1232   KETONESUR NEGATIVE 07/05/2020 1232   PROTEINUR >300 (A) 07/05/2020 1232   NITRITE NEGATIVE 07/05/2020 1232   LEUKOCYTESUR SMALL (A) 07/05/2020 1232   Sepsis Labs: @LABRCNTIP (procalcitonin:4,lacticidven:4)  ) Recent Results (from the past 240 hour(s))  Resp Panel by RT-PCR (Flu A&B, Covid) Nasopharyngeal Swab     Status: None   Collection Time: 11/07/20  6:21 AM   Specimen: Nasopharyngeal Swab; Nasopharyngeal(NP) swabs in vial transport medium  Result Value Ref Range Status   SARS Coronavirus 2 by RT PCR NEGATIVE NEGATIVE Final    Comment:  (NOTE) SARS-CoV-2 target nucleic acids are NOT DETECTED.  The SARS-CoV-2 RNA is generally detectable in upper respiratory specimens during the acute phase of infection. The lowest concentration of SARS-CoV-2 viral copies this assay can detect is 138 copies/mL. A negative result does not preclude SARS-Cov-2 infection and should not be used as the sole basis for treatment or other patient management decisions. A negative result may occur with  improper specimen collection/handling, submission of specimen other than nasopharyngeal swab, presence of viral mutation(s) within the areas targeted by this assay, and inadequate number of viral copies(<138 copies/mL). A negative result must be combined with clinical observations, patient history, and epidemiological information. The expected result is Negative.  Fact Sheet for Patients:  BloggerCourse.comhttps://www.fda.gov/media/152166/download  Fact Sheet for Healthcare Providers:  SeriousBroker.ithttps://www.fda.gov/media/152162/download  This test is no t yet approved or cleared by the [place] FDA and  has been authorized for detection and/or diagnosis of SARS-CoV-2 by FDA under an Emergency Use Authorization (EUA). This EUA will remain  in effect (meaning this test can be used) for the duration of the  COVID-19 declaration under Section 564(b)(1) of the Act, 21 U.S.C.section 360bbb-3(b)(1), unless the authorization is terminated  or revoked sooner.       Influenza A by PCR NEGATIVE NEGATIVE Final   Influenza B by PCR NEGATIVE NEGATIVE Final    Comment: (NOTE) The Xpert Xpress SARS-CoV-2/FLU/RSV plus assay is intended as an aid in the diagnosis of influenza from Nasopharyngeal swab specimens and should not be used as a sole basis for treatment. Nasal washings and aspirates are unacceptable for Xpert Xpress SARS-CoV-2/FLU/RSV testing.  Fact Sheet for Patients: BloggerCourse.com  Fact Sheet for Healthcare  Providers: SeriousBroker.it  This test is not yet approved or cleared by the [place] FDA and has been authorized for detection and/or diagnosis of SARS-CoV-2 by FDA under an Emergency Use Authorization (EUA). This EUA will remain in effect (meaning this test can be used) for the duration of the COVID-19 declaration under Section 564(b)(1) of the Act, 21 U.S.C. section 360bbb-3(b)(1), unless the authorization is terminated or revoked.  Performed at [facility], [address] [place], [place] [address]   Resp Panel by RT-PCR (Flu A&B, Covid) Nasopharyngeal Swab     Status: None   Collection Time: 11/13/20 10:17 PM   Specimen: Nasopharyngeal Swab; Nasopharyngeal(NP) swabs in vial transport medium  Result Value Ref Range Status   SARS Coronavirus 2 by RT PCR NEGATIVE NEGATIVE Final    Comment: (NOTE) SARS-CoV-2 target nucleic acids are NOT DETECTED.  The SARS-CoV-2 RNA is generally detectable in upper respiratory specimens during the acute phase of infection. The lowest concentration of SARS-CoV-2 viral copies this assay can detect is 138 copies/mL. A negative result does not preclude SARS-Cov-2 infection and should not be used as the sole basis for treatment or other patient management decisions. A negative result may occur with  improper specimen collection/handling, submission of specimen other than nasopharyngeal swab, presence of viral mutation(s) within the areas targeted by this assay, and inadequate number of viral copies(<138 copies/mL). A negative result must be combined with clinical observations, patient history, and epidemiological information. The expected result is Negative.  Fact Sheet for Patients:  BloggerCourse.com  Fact Sheet for Healthcare Providers:  SeriousBroker.it  This test is no t yet approved or cleared by the [place] FDA and  has been authorized for  detection and/or diagnosis of SARS-CoV-2 by FDA under an Emergency Use Authorization (EUA). This EUA will remain  in effect (meaning this test can be used) for the duration of the COVID-19 declaration under Section 564(b)(1) of the Act, 21 U.S.C.section 360bbb-3(b)(1), unless the authorization is terminated  or revoked sooner.       Influenza A by PCR NEGATIVE NEGATIVE Final   Influenza B by PCR NEGATIVE NEGATIVE Final    Comment: (NOTE) The Xpert Xpress SARS-CoV-2/FLU/RSV plus assay is intended as an aid in the diagnosis of influenza from Nasopharyngeal swab specimens and should not be used as a sole basis for treatment. Nasal washings and aspirates are unacceptable for Xpert Xpress SARS-CoV-2/FLU/RSV testing.  Fact Sheet for Patients: BloggerCourse.com  Fact Sheet for Healthcare Providers: SeriousBroker.it  This test is not yet approved or cleared by the [place] FDA and has been authorized for detection and/or diagnosis of SARS-CoV-2 by FDA under an Emergency Use Authorization (EUA). This EUA will remain in effect (meaning this test can be used) for the duration of the COVID-19 declaration under Section 564(b)(1) of the Act, 21 U.S.C. section 360bbb-3(b)(1), unless the authorization is terminated or revoked.  Performed at [facility], [address] [place], [place] [address]       Studies: DG Chest Portable 1 View  Result Date: 11/13/2020 CLINICAL DATA:  COPD exacerbation. EXAM: PORTABLE CHEST 1 VIEW COMPARISON:  11/07/2020 FINDINGS: Cardiomegaly. Aortic atherosclerosis. Biapical scarring. No acute confluent opacities or effusions. Mild hyperinflation. No acute bony abnormality. IMPRESSION: Cardiomegaly, COPD.  Chronic changes.  No active disease. Electronically Signed   By: Charlett Nose M.D.   On: 11/13/2020 21:23    Scheduled Meds: . aspirin  81 mg Oral Daily  . atorvastatin  10 mg Oral QHS  .  bisoprolol  5 mg Oral Daily  . enoxaparin (LOVENOX) injection  40 mg Subcutaneous Q24H  . furosemide  40 mg Intravenous Q12H  . guaiFENesin  600 mg Oral BID  . ipratropium-albuterol  3 mL Nebulization QID  . methylPREDNISolone (SOLU-MEDROL) injection  40 mg Intravenous Q8H   Followed by  . [START ON 11/15/2020] predniSONE  40 mg Oral Q breakfast    Continuous Infusions: . cefTRIAXone (ROCEPHIN)  IV Stopped (11/14/20 0048)     LOS: 1 day     Myrtie Neither, MD [facility]  To reach me or the doctor on call, go to: [email or web address] Password [facility]  11/14/2020, 5:28 PM

## 2020-11-14 NOTE — ED Notes (Signed)
Jennifer W RN aware of assigned bed 

## 2020-11-14 NOTE — Consult Note (Addendum)
Cardiology Consultation:   Patient ID: Bridget [name]; [id number]; [birth year]   Admit date: 11/13/2020 Date of Consult: 11/14/2020  Primary Care Provider: Glori Bickers, MD Primary Cardiologist: Mariah Milling Primary Electrophysiologist:  None   Patient Profile:   Bridget [name] is a 87 y.o. female with a hx of HFpEF, permanent A. fib, chronic hypercapnic and hypoxic respiratory failure on supplemental oxygen secondary to COPD and asthma, mild dementia, and HTN who is being seen today for the evaluation of volume overload at the request of Dr. Arville Care.  History of Present Illness:   Ms. Degraff lives in [place], [place] though spends a lot of time with her daughter here in [place], [place].  She has not been followed by cardiology as an outpatient and has indicated her PCP has been managing her A. fib.    She was admitted to the hospital in 06/2020 with COVID infection and was treated with steroids and remdesivir.  She was admitted in 08/2020 with COPD exacerbation and acute on chronic HFpEF with symptoms improving with bronchodilators, steroids, IV Lasix, and supplemental oxygen.  Echo during that admission showed an EF of 60 to 65%, no regional wall motion normalities, mild LVH, grade 1 diastolic dysfunction, normal RV systolic function and ventricular cavity size, mildly dilated right atrium, mild mitral regurgitation, and mild aortic valve insufficiency.  She was recently admitted to [facility] from 5/28 through 5/31 for acute hypoxic and hypercarbic respiratory failure in the setting of COPD exacerbation.  During her admission she was noted to have a mildly elevated troponin secondary to demand ischemia, peaking at 601, with echo demonstrating an EF of 55%, no regional wall motion abnormalities, normal RV systolic function with mildly enlarged RV cavity size, moderately elevated PASP estimated at 56 mmHg, mildly dilated left atrium, moderately dilated right atrium, mild to moderate mitral  regurgitation, and an estimated right atrial pressure of 15 mmHg.  She was also noted to have chronic A. Fib.  WIth regards to [facility], prior hospital notes have indicated the patient is not on anticoagulation secondary to fall risk.  Cardiology note on 11/07/2020 indicated the patient was on Eliquis 2.5 mg twice daily at home.  However, the patient was uncertain if she was taking this medication.  Cardiology notes recommended continuing Eliquis.  At time of discharge it was recommended the patient not to take Eliquis due to fall risk.  She was not felt to be volume overloaded.  She returned to [facility] on 6/3 with worsening shortness of breath, wheezing, and cough.  No chest pain or palpitations.  Upon arrival to the ED she was hypertensive with a BP of 158/101, afebrile, and hypoxic on room air with oxygen saturation of 89%.  She was admitted with acute on chronic hypoxic respiratory failure secondary to COPD exacerbation and HFpEF.  EKG demonstrated A. fib, 80 bpm, right axis deviation, rare PVC, poor R wave progression along the precordial leads, underlying artifact, and nonspecific ST-T changes.  Chest x-ray showed cardiomegaly with COPD and chronic changes without active disease.  Labs revealed an initial high-sensitivity troponin of 164 which was consistent with a downward trend from her recent high-sensitivity troponins obtained earlier this week.  Subsequent values have continued to downtrend.  BNP was elevated at greater than 4500 which was a significant increase from her value obtained 1 week prior.  BUN/SCR 34/0.91.  Hgb 16.9.  COVID and influenza negative.  She was admitted with acute on chronic hypoxic and hypercapnic respiratory failure secondary to COPD  exacerbation and acute on chronic HFpEF.  She has received IV Rocephin, IV steroids, nebulizers, and IV Lasix 40 mg.  Documented urine output 1.3 L for the admission to date.      Past Medical History:  Diagnosis Date  . (HFpEF) heart failure with  preserved ejection fraction (HCC)    a. 08/2020 Echo: EF 60-65%, no rwma, Gr1 DD, nl RV fxn. Mildly dil RA. Mild MR/AI.  Marland Kitchen Asthma   . CKD (chronic kidney disease), stage III (HCC)   . COPD (chronic obstructive pulmonary disease) (HCC)   . Falls   . Hypertension   . Mild Dementia (HCC)   . Permanent atrial fibrillation (HCC)    a. CHA2DS2VASc = 5-->no OAC 2/2 unsteady gait/falls.  Waymon Budge gait     Past Surgical History:  Procedure Laterality Date  . APPENDECTOMY    . BREAST SURGERY    . HERNIA REPAIR       Home Meds: Prior to Admission medications   Medication Sig Start Date End Date Taking? Authorizing Provider  atorvastatin (LIPITOR) 10 MG tablet Take 1 tablet (10 mg total) by mouth at bedtime. 11/10/20  Yes Wieting, Richard, MD  bisoprolol (ZEBETA) 5 MG tablet Take 1 tablet (5 mg total) by mouth daily. 11/10/20  Yes Wieting, Richard, MD  furosemide (LASIX) 20 MG tablet Take 1 tablet (20 mg total) by mouth daily. 11/10/20 12/10/20 Yes Wieting, Richard, MD  ipratropium-albuterol (DUONEB) 0.5-2.5 (3) MG/3ML SOLN Take 3 mLs by nebulization 4 (four) times daily. 11/10/20  Yes Wieting, Richard, MD  albuterol (VENTOLIN HFA) 108 (90 Base) MCG/ACT inhaler Inhale 2 puffs into the lungs every 6 (six) hours as needed for wheezing or shortness of breath. Patient not taking: No sig reported 11/10/20   Alford Highland, MD  aspirin 81 MG chewable tablet Chew 1 tablet (81 mg total) by mouth daily. Patient not taking: No sig reported 11/10/20   Alford Highland, MD  mometasone-formoterol [facility]) 100-5 MCG/ACT AERO Inhale 2 puffs into the lungs 2 (two) times daily. Patient not taking: No sig reported 11/10/20   Alford Highland, MD  predniSONE (DELTASONE) 20 MG tablet Two tab po daily for two days Patient not taking: Reported on 11/14/2020 11/10/20   Alford Highland, MD  Spacer/Aero-Holding Chambers DEVI 1 Device by Does not apply route 2 (two) times daily. 11/10/20   Alford Highland, MD    Inpatient  Medications: Scheduled Meds: . aspirin  81 mg Oral Daily  . atorvastatin  10 mg Oral QHS  . bisoprolol  5 mg Oral Daily  . enoxaparin (LOVENOX) injection  40 mg Subcutaneous Q24H  . furosemide  40 mg Intravenous Q12H  . guaiFENesin  600 mg Oral BID  . ipratropium-albuterol  3 mL Nebulization QID  . methylPREDNISolone (SOLU-MEDROL) injection  40 mg Intravenous Q8H   Followed by  . [START ON 11/15/2020] predniSONE  40 mg Oral Q breakfast   Continuous Infusions: . cefTRIAXone (ROCEPHIN)  IV Stopped (11/14/20 0048)   PRN Meds: acetaminophen **OR** acetaminophen, chlorpheniramine-HYDROcodone, magnesium hydroxide, ondansetron **OR** ondansetron (ZOFRAN) IV, traZODone  Allergies:  No Known Allergies  Social History:   Social History   Socioeconomic History  . Marital status: Widowed    Spouse name: Not on file  . Number of children: Not on file  . Years of education: Not on file  . Highest education level: Not on file  Occupational History  . Not on file  Tobacco Use  . Smoking status: Former Games developer  . Smokeless tobacco:  Never Used  Substance and Sexual Activity  . Alcohol use: Never  . Drug use: Never  . Sexual activity: Not Currently    Birth control/protection: Post-menopausal  Other Topics Concern  . Not on file  Social History Narrative  . Not on file   Social Determinants of Health   Financial Resource Strain: Not on file  Food Insecurity: Not on file  Transportation Needs: Not on file  Physical Activity: Not on file  Stress: Not on file  Social Connections: Not on file  Intimate Partner Violence: Not on file     Family History:   Family History  Problem Relation Age of Onset  . Immunocompromised Neg Hx     ROS:  Review of Systems  Constitutional: Positive for malaise/fatigue. Negative for chills, diaphoresis, fever and weight loss.  HENT: Negative for congestion.   Eyes: Negative for discharge and redness.  Respiratory: Positive for cough, shortness of  breath and wheezing. Negative for sputum production.   Cardiovascular: Negative for chest pain, palpitations, orthopnea, claudication, leg swelling and PND.  Gastrointestinal: Negative for abdominal pain, heartburn, nausea and vomiting.  Genitourinary: Negative for hematuria.  Musculoskeletal: Negative for falls and myalgias.  Skin: Negative for rash.  Neurological: Positive for weakness. Negative for dizziness, tingling, tremors, sensory change, speech change, focal weakness and loss of consciousness.  Endo/Heme/Allergies: Does not bruise/bleed easily.  Psychiatric/Behavioral: Negative for substance abuse. The patient is not nervous/anxious.   All other systems reviewed and are negative.     Physical Exam/Data:   Vitals:   11/14/20 0600 11/14/20 0630 11/14/20 0700 11/14/20 0710  BP: 121/85 102/62 (!) 101/58   Pulse: 77 82 72   Resp: 15 18 14    Temp:      TempSrc:      SpO2: 98% 98% 97% 99%  Weight:      Height:        Intake/Output Summary (Last 24 hours) at 11/14/2020 0953 Last data filed at 11/14/2020 0554 Gross per 24 hour  Intake --  Output 1300 ml  Net -1300 ml   Filed Weights   11/13/20 2042  Weight: 61.2 kg   Body mass index is 23.17 kg/m.   Physical Exam: See MD attestation  EKG:  The EKG was personally reviewed and demonstrates: A. fib, 80 bpm, right axis deviation, rare PVC, poor R wave progression along the precordial leads, underlying artifact, and nonspecific ST-T changes Telemetry:  Telemetry was personally reviewed and demonstrates: A. fib  Weights: Filed Weights   11/13/20 2042  Weight: 61.2 kg    Relevant CV Studies:  2D echo 11/09/2020: 1. Left ventricular ejection fraction, by estimation, is 55 %. The left  ventricle has normal function. The left ventricle has no regional wall  motion abnormalities.  2. Right ventricular systolic function is normal. The right ventricular  size is mildly enlarged. There is moderately elevated pulmonary artery   systolic pressure. The estimated right ventricular systolic pressure is  56.0 mmHg.  3. Left atrial size was mildly dilated.  4. Right atrial size was moderately dilated.  5. The mitral valve is normal in structure. Mild to moderate mitral valve  regurgitation  6. The inferior vena cava is dilated in size with <50% respiratory  variability, suggesting right atrial pressure of 15 mmHg. __________  2D echo 08/31/2020: 1. Left ventricular ejection fraction, by estimation, is 60 to 65%. The  left ventricle has normal function. The left ventricle has no regional  wall motion abnormalities. There is mild  left ventricular hypertrophy.  Left ventricular diastolic parameters  are consistent with Grade I diastolic dysfunction (impaired relaxation).  2. Right ventricular systolic function is normal. The right ventricular  size is normal.  3. Right atrial size was mildly dilated.  4. The mitral valve is grossly normal. Mild mitral valve regurgitation.  5. The aortic valve was not well visualized. Aortic valve regurgitation  is mild.   Laboratory Data:  Chemistry Recent Labs  Lab 11/09/20 0414 11/13/20 2105 11/14/20 0709  NA 135 137 137  K 4.5 5.0 3.9  CL 99 96* 92*  CO2 27 29 32  GLUCOSE 104* 126* 166*  BUN 36* 34* 32*  CREATININE 0.91 0.91 0.87  CALCIUM 9.0 9.1 8.7*  GFRNONAA 59* 59* >60  ANIONGAP 9 12 13     Recent Labs  Lab 11/13/20 2105  PROT 7.2  ALBUMIN 4.0  AST 33  ALT 84*  ALKPHOS 53  BILITOT 1.2   Hematology Recent Labs  Lab 11/08/20 0501 11/13/20 2105 11/14/20 0546  WBC 10.7* 13.2* 9.9  RBC 4.67 5.39* 5.41*  HGB 14.5 16.9* 16.9*  HCT 43.7 50.7* 51.2*  MCV 93.6 94.1 94.6  MCH 31.0 31.4 31.2  MCHC 33.2 33.3 33.0  RDW 14.0 13.9 14.2  PLT 170 260 237   Cardiac EnzymesNo results for input(s): TROPONINI in the last 168 hours. No results for input(s): TROPIPOC in the last 168 hours.  BNP Recent Labs  Lab 11/13/20 2105  BNP >4,500.0*    DDimer  No results for input(s): DDIMER in the last 168 hours.  Radiology/Studies:  DG Chest Portable 1 View  Result Date: 11/13/2020 IMPRESSION: Cardiomegaly, COPD.  Chronic changes.  No active disease. Electronically Signed   By: 01/13/2021 M.D.   On: 11/13/2020 21:23    Assessment and Plan:   1.  Acute on chronic hypoxic and hypercarbic respiratory failure: -Multifactorial including COPD exacerbation and acute on chronic HFpEF -Wean supplemental oxygen as able -Management as outlined below  2.  Acute on chronic HFpEF: -IV Lasix -Optimal BP control -Wean steroids when able -Echo just 5 days ago demonstrating preserved LV systolic function -Daily weights -Strict ins and outs  3.  AECOPD: -Management per internal medicine  4.  Elevated high-sensitivity troponin: -Minimally elevated and downtrending, likely in the context of continued downward trend from her recent admission where she was noted to have a peak high-sensitivity troponin of approximately 600 -No indication for heparin drip -ASA -Atorvastatin -No plans for inpatient ischemic evaluation given lack of anginal symptoms  -Outpatient follow-up  5.  Permanent A. fib: -Ventricular rates well controlled -In the setting of her underlying COPD exacerbation, initially beta-blockers were deferred, however she did not tolerate diltiazem secondary to hypotension and in this setting was started on low-dose bisoprolol on 11/09/2020 -Remains on bisoprolol, if needed could rechallenge with diltiazem given active COPD exacerbation -CHA2DS2-VASc at least 6 (CHF, HTN, age x2, vascular disease, sex category) -Has previously not been managed with anticoagulation due to fall risk  6.  HTN: -Elevated BP upon presentation likely contributing to her volume status -Blood pressure much improved -Continue current therapy   For questions or updates, please contact [facility] Please consult [email or web address] for contact info under  Cardiology/STEMI.   Signed, 11/11/2020, PA-C [facility] Pager: [phone] 11/14/2020, 9:53 AM  Cardiology Attending  Patient seen and examined. Agree with the findings as documented above. She is probably not far off of her baseline. She has moderate pulmonary artery HTN, severe lung  disease and HFPEF and is again admitted with respiratory failure. I agree with above. She needs IV lasix, rate control and control of her HTN. She will be prone to developed sob with her baseline lung disease as well as pulmonary HTN and chronic atrial fib. I will defer decision on systemic anti-coagulation to those who know her better than me. Control of her bp will be increasingly important. I agree. No indication to work up or treat with IV heparin for elevated troponin/CAD. Continue bronchodilators. I think goals of care need to be addressed. She may well be a canidate for home hospice.   Sharlot Gowda Bevin Mayall,MD

## 2020-11-14 NOTE — ED Notes (Signed)
Pt given meal tray.

## 2020-11-14 NOTE — ED Notes (Addendum)
Provided pt with coffee, no other needs at this time

## 2020-11-15 LAB — CBC WITH DIFFERENTIAL/PLATELET
Abs Immature Granulocytes: 0.17 10*3/uL — ABNORMAL HIGH (ref 0.00–0.07)
Basophils Absolute: 0 10*3/uL (ref 0.0–0.1)
Basophils Relative: 0 %
Eosinophils Absolute: 0 10*3/uL (ref 0.0–0.5)
Eosinophils Relative: 0 %
HCT: 51.9 % — ABNORMAL HIGH (ref 36.0–46.0)
Hemoglobin: 17.2 g/dL — ABNORMAL HIGH (ref 12.0–15.0)
Immature Granulocytes: 1 %
Lymphocytes Relative: 2 %
Lymphs Abs: 0.5 10*3/uL — ABNORMAL LOW (ref 0.7–4.0)
MCH: 30.9 pg (ref 26.0–34.0)
MCHC: 33.1 g/dL (ref 30.0–36.0)
MCV: 93.3 fL (ref 80.0–100.0)
Monocytes Absolute: 1.2 10*3/uL — ABNORMAL HIGH (ref 0.1–1.0)
Monocytes Relative: 6 %
Neutro Abs: 17 10*3/uL — ABNORMAL HIGH (ref 1.7–7.7)
Neutrophils Relative %: 91 %
Platelets: 275 10*3/uL (ref 150–400)
RBC: 5.56 MIL/uL — ABNORMAL HIGH (ref 3.87–5.11)
RDW: 13.9 % (ref 11.5–15.5)
WBC: 18.8 10*3/uL — ABNORMAL HIGH (ref 4.0–10.5)
nRBC: 0 % (ref 0.0–0.2)

## 2020-11-15 LAB — TROPONIN I (HIGH SENSITIVITY): Troponin I (High Sensitivity): 81 ng/L — ABNORMAL HIGH (ref <18)

## 2020-11-15 LAB — BASIC METABOLIC PANEL
Anion gap: 14 (ref 5–15)
BUN: 53 mg/dL — ABNORMAL HIGH (ref 8–23)
CO2: 32 mmol/L (ref 22–32)
Calcium: 9.4 mg/dL (ref 8.9–10.3)
Chloride: 91 mmol/L — ABNORMAL LOW (ref 98–111)
Creatinine, Ser: 1.17 mg/dL — ABNORMAL HIGH (ref 0.44–1.00)
GFR, Estimated: 44 mL/min — ABNORMAL LOW (ref >60)
Glucose, Bld: 146 mg/dL — ABNORMAL HIGH (ref 70–99)
Potassium: 3.7 mmol/L (ref 3.5–5.1)
Sodium: 137 mmol/L (ref 135–145)

## 2020-11-15 MED ORDER — PREDNISONE 20 MG PO TABS
40.0000 mg | ORAL_TABLET | Freq: Every day | ORAL | Status: AC
  Administered 2020-11-15 – 2020-11-18 (×4): 40 mg via ORAL
  Filled 2020-11-15 (×3): qty 2

## 2020-11-15 MED ORDER — ENOXAPARIN SODIUM 30 MG/0.3ML IJ SOSY
30.0000 mg | PREFILLED_SYRINGE | INTRAMUSCULAR | Status: DC
  Administered 2020-11-16: 30 mg via SUBCUTANEOUS
  Filled 2020-11-15: qty 0.3

## 2020-11-15 MED ORDER — IPRATROPIUM-ALBUTEROL 0.5-2.5 (3) MG/3ML IN SOLN
3.0000 mL | Freq: Three times a day (TID) | RESPIRATORY_TRACT | Status: DC
  Administered 2020-11-15 – 2020-11-16 (×3): 3 mL via RESPIRATORY_TRACT
  Filled 2020-11-15 (×2): qty 3

## 2020-11-15 NOTE — Progress Notes (Signed)
PHARMACIST - PHYSICIAN COMMUNICATION  CONCERNING:  Enoxaparin (Lovenox) for DVT Prophylaxis    RECOMMENDATION: Patient was prescribed enoxaprin 40mg  q24 hours for VTE prophylaxis.   Filed Weights   11/13/20 2042 11/15/20 0500  Weight: 61.2 kg (135 lb) 62.3 kg (137 lb 5.6 oz)    Body mass index is 23.58 kg/m.  Estimated Creatinine Clearance: 25.9 mL/min (A) (by C-G formula based on SCr of 1.17 mg/dL (H)).   Patient is candidate for enoxaparin 30mg  every 24 hours based on CrCl <76ml/min or Weight <45kg  DESCRIPTION: Pharmacy has adjusted enoxaparin dose per [facility] policy.  Patient is now receiving enoxaparin 30 mg every 24 hours    31m, PharmD, BCPS Clinical Pharmacist  11/15/2020 9:37 PM

## 2020-11-15 NOTE — Plan of Care (Signed)
Pt admitted from ED previous shift. Assessment done see details in flowsheets. VSS, NAD observed. Pt on fall precautions.    Problem: Education: Goal: Knowledge of General Education information will improve Description: Including pain rating scale, medication(s)/side effects and non-pharmacologic comfort measures Outcome: Progressing   Problem: Health Behavior/Discharge Planning: Goal: Ability to manage health-related needs will improve Outcome: Progressing   Problem: Clinical Measurements: Goal: Ability to maintain clinical measurements within normal limits will improve Outcome: Progressing Goal: Will remain free from infection Outcome: Progressing Goal: Diagnostic test results will improve Outcome: Progressing Goal: Respiratory complications will improve Outcome: Progressing Goal: Cardiovascular complication will be avoided Outcome: Progressing   Problem: Activity: Goal: Risk for activity intolerance will decrease Outcome: Progressing   Problem: Nutrition: Goal: Adequate nutrition will be maintained Outcome: Progressing   Problem: Elimination: Goal: Will not experience complications related to bowel motility Outcome: Progressing Goal: Will not experience complications related to urinary retention Outcome: Progressing   Problem: Coping: Goal: Level of anxiety will decrease Outcome: Progressing   Problem: Pain Managment: Goal: General experience of comfort will improve Outcome: Progressing   Problem: Safety: Goal: Ability to remain free from injury will improve Outcome: Progressing   Problem: Skin Integrity: Goal: Risk for impaired skin integrity will decrease Outcome: Progressing   Problem: Education: Goal: Knowledge of disease or condition will improve Outcome: Progressing Goal: Understanding of medication regimen will improve Outcome: Progressing Goal: Individualized Educational Video(s) Outcome: Progressing   Problem: Activity: Goal: Ability to  tolerate increased activity will improve Outcome: Progressing   Problem: Cardiac: Goal: Ability to achieve and maintain adequate cardiopulmonary perfusion will improve Outcome: Progressing   Problem: Health Behavior/Discharge Planning: Goal: Ability to safely manage health-related needs after discharge will improve Outcome: Progressing

## 2020-11-15 NOTE — Progress Notes (Addendum)
PROGRESS NOTE  Bridget [name] [name] DOB: [birth year] DOA: 11/13/2020 PCP: Bridget [name], Bridget [name]  HPI/Recap of past 24 hours: Patient seen and examined at bedside in the emergency department.  Patient denies any new complain stated that she is still feels quite weak but does not feel sick   Update: 11/15/2020: Seen and examined very pleasant denies any complaints except that she still has some dyspnea on exertion but better than when she came in     Assessment/Plan: Active Problems:   Acute on chronic diastolic (congestive) heart failure (HCC)  Heart failure with preserved ejection fraction continue diuresis for 1 or 2 more days Continue daily weights Strict in and out  Permanent atrial fibrillation continue beta-blocker therapy.  He is she is not on anticoagulation due to fall risk  Chronic hypoxic Hypoxia respiratory failure on supplemental oxygen secondary to COPD and asthma.  Her O2 saturation on presentation to the emergency room was 88% on room air.  Continue oxygen supplementation, IV Solu-Medrol albuterol nebulizer treatment Mild leukocytosis continue ceftriaxone  Elevated troponin most likely due to demand ischemia from CHF exacerbation  Mild dementia patient had periods of forgetfulness and repeating questions otherwise very pleasant  Hypertension.  Continues home med Dyslipidemia continue statin therapy Code Status: Partial code  Severity of Illness: The appropriate patient status for this patient is INPATIENT. Inpatient status is judged to be reasonable and necessary in order to provide the required intensity of service to ensure the patient's safety. The patient's presenting symptoms, physical exam findings, and initial radiographic and laboratory data in the context of their chronic comorbidities is felt to place them at high risk for further clinical deterioration. Furthermore, it is not anticipated that the patient will be medically stable for discharge  from the hospital within 2 midnights of admission. The following factors support the patient status of inpatient.   " Congestive heart failure  * I certify that at the point of admission it is my clinical judgment that the patient will require inpatient hospital care spanning beyond 2 midnights from the point of admission due to high intensity of service, high risk for further deterioration and high frequency of surveillance required.*    Family Communication: Patient  Disposition Plan: Home when stable   Consultants:  Cardiology  Procedures:  None  Antimicrobials:  Ceftriaxone      DVT prophylaxis: Lovenox   Objective: Vitals:   11/14/20 2320 11/15/20 0428 11/15/20 0500 11/15/20 0749  BP: 91/65 104/67  126/71  Pulse: 80 63  80  Resp: 18 17  16   Temp: 98.1 F (36.7 C) (!) 97.5 F (36.4 C)  (!) 97.5 F (36.4 C)  TempSrc: Oral Oral  Oral  SpO2: 94% 95%  98%  Weight:   62.3 kg   Height:        Intake/Output Summary (Last 24 hours) at 11/15/2020 0939 Last data filed at 11/15/2020 0500 Gross per 24 hour  Intake 120 ml  Output 600 ml  Net -480 ml   Filed Weights   11/13/20 2042 11/15/20 0500  Weight: 61.2 kg 62.3 kg   Body mass index is 23.58 kg/m.  Exam:  . General: 87 y.o. year-old female well developed well nourished in no acute distress.  Alert and oriented x3. . Cardiovascular: Regular rate and rhythm with no rubs or gallops.  No thyromegaly or JVD noted.   83 Respiratory: Clear to auscultation with no wheezes or rales. Good inspiratory effort. . Abdomen: Soft nontender nondistended with normal bowel  sounds x4 quadrants. . Musculoskeletal: No lower extremity edema. 2/4 pulses in all 4 extremities. . Skin: No ulcerative lesions noted or rashes, . Psychiatry: Mood is appropriate for condition and setting    Data Reviewed: CBC: Recent Labs  Lab 11/13/20 2105 11/14/20 0546  WBC 13.2* 9.9  NEUTROABS 10.2*  --   HGB 16.9* 16.9*  HCT 50.7* 51.2*  MCV  94.1 94.6  PLT 260 237   Basic Metabolic Panel: Recent Labs  Lab 11/09/20 0414 11/13/20 2105 11/14/20 0709  NA 135 137 137  K 4.5 5.0 3.9  CL 99 96* 92*  CO2 27 29 32  GLUCOSE 104* 126* 166*  BUN 36* 34* 32*  CREATININE 0.91 0.91 0.87  CALCIUM 9.0 9.1 8.7*   GFR: Estimated Creatinine Clearance: 34.9 mL/min (by C-G formula based on SCr of 0.87 mg/dL). Liver Function Tests: Recent Labs  Lab 11/13/20 2105  AST 33  ALT 84*  ALKPHOS 53  BILITOT 1.2  PROT 7.2  ALBUMIN 4.0   No results for input(s): LIPASE, AMYLASE in the last 168 hours. No results for input(s): AMMONIA in the last 168 hours. Coagulation Profile: No results for input(s): INR, PROTIME in the last 168 hours. Cardiac Enzymes: No results for input(s): CKTOTAL, CKMB, CKMBINDEX, TROPONINI in the last 168 hours. BNP (last 3 results) No results for input(s): PROBNP in the last 8760 hours. HbA1C: No results for input(s): HGBA1C in the last 72 hours. CBG: No results for input(s): GLUCAP in the last 168 hours. Lipid Profile: No results for input(s): CHOL, HDL, LDLCALC, TRIG, CHOLHDL, LDLDIRECT in the last 72 hours. Thyroid Function Tests: No results for input(s): TSH, T4TOTAL, FREET4, T3FREE, THYROIDAB in the last 72 hours. Anemia Panel: No results for input(s): VITAMINB12, FOLATE, FERRITIN, TIBC, IRON, RETICCTPCT in the last 72 hours. Urine analysis:    Component Value Date/Time   COLORURINE YELLOW 07/05/2020 1232   APPEARANCEUR CLOUDY (A) 07/05/2020 1232   LABSPEC 1.030 07/05/2020 1232   PHURINE 5.0 07/05/2020 1232   GLUCOSEU NEGATIVE 07/05/2020 1232   HGBUR SMALL (A) 07/05/2020 1232   BILIRUBINUR NEGATIVE 07/05/2020 1232   KETONESUR NEGATIVE 07/05/2020 1232   PROTEINUR >300 (A) 07/05/2020 1232   NITRITE NEGATIVE 07/05/2020 1232   LEUKOCYTESUR SMALL (A) 07/05/2020 1232   Sepsis Labs: @LABRCNTIP (procalcitonin:4,lacticidven:4)  ) Recent Results (from the past 240 hour(s))  Resp Panel by RT-PCR (Flu  A&B, Covid) Nasopharyngeal Swab     Status: None   Collection Time: 11/07/20  6:21 AM   Specimen: Nasopharyngeal Swab; Nasopharyngeal(NP) swabs in vial transport medium  Result Value Ref Range Status   SARS Coronavirus 2 by RT PCR NEGATIVE NEGATIVE Final    Comment: (NOTE) SARS-CoV-2 target nucleic acids are NOT DETECTED.  The SARS-CoV-2 RNA is generally detectable in upper respiratory specimens during the acute phase of infection. The lowest concentration of SARS-CoV-2 viral copies this assay can detect is 138 copies/mL. A negative result does not preclude SARS-Cov-2 infection and should not be used as the sole basis for treatment or other patient management decisions. A negative result may occur with  improper specimen collection/handling, submission of specimen other than nasopharyngeal swab, presence of viral mutation(s) within the areas targeted by this assay, and inadequate number of viral copies(<138 copies/mL). A negative result must be combined with clinical observations, patient history, and epidemiological information. The expected result is Negative.  Fact Sheet for Patients:  11/09/20  Fact Sheet for Healthcare Providers:  BloggerCourse.com  This test is no t yet approved or  cleared by the [place] FDA and  has been authorized for detection and/or diagnosis of SARS-CoV-2 by FDA under an Emergency Use Authorization (EUA). This EUA will remain  in effect (meaning this test can be used) for the duration of the COVID-19 declaration under Section 564(b)(1) of the Act, 21 U.S.C.section 360bbb-3(b)(1), unless the authorization is terminated  or revoked sooner.       Influenza A by PCR NEGATIVE NEGATIVE Final   Influenza B by PCR NEGATIVE NEGATIVE Final    Comment: (NOTE) The Xpert Xpress SARS-CoV-2/FLU/RSV plus assay is intended as an aid in the diagnosis of influenza from Nasopharyngeal swab specimens  and should not be used as a sole basis for treatment. Nasal washings and aspirates are unacceptable for Xpert Xpress SARS-CoV-2/FLU/RSV testing.  Fact Sheet for Patients: BloggerCourse.comhttps://www.fda.gov/media/152166/download  Fact Sheet for Healthcare Providers: SeriousBroker.ithttps://www.fda.gov/media/152162/download  This test is not yet approved or cleared by the [place] FDA and has been authorized for detection and/or diagnosis of SARS-CoV-2 by FDA under an Emergency Use Authorization (EUA). This EUA will remain in effect (meaning this test can be used) for the duration of the COVID-19 declaration under Section 564(b)(1) of the Act, 21 U.S.C. section 360bbb-3(b)(1), unless the authorization is terminated or revoked.  Performed at [facility], [address] [place], [place] [address]   Resp Panel by RT-PCR (Flu A&B, Covid) Nasopharyngeal Swab     Status: None   Collection Time: 11/13/20 10:17 PM   Specimen: Nasopharyngeal Swab; Nasopharyngeal(NP) swabs in vial transport medium  Result Value Ref Range Status   SARS Coronavirus 2 by RT PCR NEGATIVE NEGATIVE Final    Comment: (NOTE) SARS-CoV-2 target nucleic acids are NOT DETECTED.  The SARS-CoV-2 RNA is generally detectable in upper respiratory specimens during the acute phase of infection. The lowest concentration of SARS-CoV-2 viral copies this assay can detect is 138 copies/mL. A negative result does not preclude SARS-Cov-2 infection and should not be used as the sole basis for treatment or other patient management decisions. A negative result may occur with  improper specimen collection/handling, submission of specimen other than nasopharyngeal swab, presence of viral mutation(s) within the areas targeted by this assay, and inadequate number of viral copies(<138 copies/mL). A negative result must be combined with clinical observations, patient history, and epidemiological information. The expected result is Negative.  Fact  Sheet for Patients:  BloggerCourse.comhttps://www.fda.gov/media/152166/download  Fact Sheet for Healthcare Providers:  SeriousBroker.ithttps://www.fda.gov/media/152162/download  This test is no t yet approved or cleared by the [place] FDA and  has been authorized for detection and/or diagnosis of SARS-CoV-2 by FDA under an Emergency Use Authorization (EUA). This EUA will remain  in effect (meaning this test can be used) for the duration of the COVID-19 declaration under Section 564(b)(1) of the Act, 21 U.S.C.section 360bbb-3(b)(1), unless the authorization is terminated  or revoked sooner.       Influenza A by PCR NEGATIVE NEGATIVE Final   Influenza B by PCR NEGATIVE NEGATIVE Final    Comment: (NOTE) The Xpert Xpress SARS-CoV-2/FLU/RSV plus assay is intended as an aid in the diagnosis of influenza from Nasopharyngeal swab specimens and should not be used as a sole basis for treatment. Nasal washings and aspirates are unacceptable for Xpert Xpress SARS-CoV-2/FLU/RSV testing.  Fact Sheet for Patients: BloggerCourse.comhttps://www.fda.gov/media/152166/download  Fact Sheet for Healthcare Providers: SeriousBroker.ithttps://www.fda.gov/media/152162/download  This test is not yet approved or cleared by the [place] FDA and has been authorized for detection and/or diagnosis of SARS-CoV-2 by FDA under an Emergency Use  Authorization (EUA). This EUA will remain in effect (meaning this test can be used) for the duration of the COVID-19 declaration under Section 564(b)(1) of the Act, 21 U.S.C. section 360bbb-3(b)(1), unless the authorization is terminated or revoked.  Performed at [facility], [address] [place], [place] [address]       Studies: No results found.  Scheduled Meds: . aspirin  81 mg Oral Daily  . atorvastatin  10 mg Oral QHS  . bisoprolol  5 mg Oral Daily  . enoxaparin (LOVENOX) injection  40 mg Subcutaneous Q24H  . furosemide  40 mg Intravenous Q12H  . guaiFENesin  600 mg Oral BID  .  ipratropium-albuterol  3 mL Nebulization QID  . predniSONE  40 mg Oral Q breakfast    Continuous Infusions: . cefTRIAXone (ROCEPHIN)  IV 1 g (11/14/20 2144)     LOS: 2 days     Bridget Neither, Bridget [name] [facility]  To reach me or the doctor on call, go to: [email or web address] Password [facility]  11/15/2020, 9:39 AM

## 2020-11-15 NOTE — Progress Notes (Addendum)
Progress Note  Patient Name: Bridget [name] Date of Encounter: 11/15/2020  Primary Cardiologist: Mariah Milling  Subjective   No chest pain. SOB improving. Still wheezing. Documented UOP 480 mL for the past 24 hours with a net - 1.7 L for the admission. No BMP this morning.   Inpatient Medications    Scheduled Meds: . aspirin  81 mg Oral Daily  . atorvastatin  10 mg Oral QHS  . bisoprolol  5 mg Oral Daily  . enoxaparin (LOVENOX) injection  40 mg Subcutaneous Q24H  . furosemide  40 mg Intravenous Q12H  . guaiFENesin  600 mg Oral BID  . ipratropium-albuterol  3 mL Nebulization QID  . predniSONE  40 mg Oral Q breakfast   Continuous Infusions: . cefTRIAXone (ROCEPHIN)  IV 1 g (11/14/20 2144)   PRN Meds: acetaminophen **OR** acetaminophen, chlorpheniramine-HYDROcodone, magnesium hydroxide, ondansetron **OR** ondansetron (ZOFRAN) IV, traZODone   Vital Signs    Vitals:   11/14/20 2320 11/15/20 0428 11/15/20 0500 11/15/20 0749  BP: 91/65 104/67  126/71  Pulse: 80 63  80  Resp: 18 17  16   Temp: 98.1 F (36.7 C) (!) 97.5 F (36.4 C)  (!) 97.5 F (36.4 C)  TempSrc: Oral Oral  Oral  SpO2: 94% 95%  98%  Weight:   62.3 kg   Height:        Intake/Output Summary (Last 24 hours) at 11/15/2020 0849 Last data filed at 11/15/2020 0500 Gross per 24 hour  Intake 120 ml  Output 600 ml  Net -480 ml   Filed Weights   11/13/20 2042 11/15/20 0500  Weight: 61.2 kg 62.3 kg    Telemetry    Afib, 70s to 80s bpm - Personally Reviewed  ECG    No new tracings - Personally Reviewed  Physical Exam   GEN: No acute distress.   Neck: No JVD. Cardiac: IRIR, no murmurs, rubs, or gallops.  Respiratory: Diminished breath sounds bilaterally with expiratory wheezing.  GI: Soft, nontender, non-distended.   MS: No edema; No deformity. Neuro:  Alert and oriented x 3; Nonfocal.  Psych: Normal affect.  Labs    Chemistry Recent Labs  Lab 11/09/20 0414 11/13/20 2105 11/14/20 0709  NA 135 137  137  K 4.5 5.0 3.9  CL 99 96* 92*  CO2 27 29 32  GLUCOSE 104* 126* 166*  BUN 36* 34* 32*  CREATININE 0.91 0.91 0.87  CALCIUM 9.0 9.1 8.7*  PROT  --  7.2  --   ALBUMIN  --  4.0  --   AST  --  33  --   ALT  --  84*  --   ALKPHOS  --  53  --   BILITOT  --  1.2  --   GFRNONAA 59* 59* >60  ANIONGAP 9 12 13      Hematology Recent Labs  Lab 11/13/20 2105 11/14/20 0546  WBC 13.2* 9.9  RBC 5.39* 5.41*  HGB 16.9* 16.9*  HCT 50.7* 51.2*  MCV 94.1 94.6  MCH 31.4 31.2  MCHC 33.3 33.0  RDW 13.9 14.2  PLT 260 237    Cardiac EnzymesNo results for input(s): TROPONINI in the last 168 hours. No results for input(s): TROPIPOC in the last 168 hours.   BNP Recent Labs  Lab 11/13/20 2105  BNP >4,500.0*     DDimer No results for input(s): DDIMER in the last 168 hours.   Radiology    DG Chest Portable 1 View  Result Date: 11/13/2020 IMPRESSION: Cardiomegaly,  COPD.  Chronic changes.  No active disease. Electronically Signed   By: Charlett Nose M.D.   On: 11/13/2020 21:23    Cardiac Studies   2D echo 11/09/2020: 1. Left ventricular ejection fraction, by estimation, is 55 %. The left  ventricle has normal function. The left ventricle has no regional wall  motion abnormalities.  2. Right ventricular systolic function is normal. The right ventricular  size is mildly enlarged. There is moderately elevated pulmonary artery  systolic pressure. The estimated right ventricular systolic pressure is  56.0 mmHg.  3. Left atrial size was mildly dilated.  4. Right atrial size was moderately dilated.  5. The mitral valve is normal in structure. Mild to moderate mitral valve  regurgitation  6. The inferior vena cava is dilated in size with <50% respiratory  variability, suggesting right atrial pressure of 15 mmHg. __________  2D echo 08/31/2020: 1. Left ventricular ejection fraction, by estimation, is 60 to 65%. The  left ventricle has normal function. The left ventricle has no  regional  wall motion abnormalities. There is mild left ventricular hypertrophy.  Left ventricular diastolic parameters  are consistent with Grade I diastolic dysfunction (impaired relaxation).  2. Right ventricular systolic function is normal. The right ventricular  size is normal.  3. Right atrial size was mildly dilated.  4. The mitral valve is grossly normal. Mild mitral valve regurgitation.  5. The aortic valve was not well visualized. Aortic valve regurgitation  is mild.   Patient Profile     87 y.o. female with history of HFpEF, permanent A. fib, chronic hypercapnic and hypoxic respiratory failure on supplemental oxygen secondary to COPD and asthma, mild dementia, and HTN who is being seen today for the evaluation of volume overload at the request of Dr. Arville Care.  Assessment & Plan    1. Acute on chronic hypoxic and hypercarbic respiratory failure: -Multifactorial including COPD exacerbation and acute on chronic HFpEF -Weaned to room air -Management as outlined below  2.  Acute on chronic HFpEF: -IV Lasix pending BMP this morning -Optimal BP control -Wean steroids when able -Echo just 5 days ago demonstrating preserved LV systolic function -Daily weights -Strict ins and outs  3.  AECOPD: -Management per internal medicine  4.  Elevated high-sensitivity troponin: -Minimally elevated and downtrending,consistent with continued downward trend from her recent admission where she was noted to have a peak high-sensitivity troponin of approximately 600 -Discontinued order for continued troponin cycling given the above downward documented trend and lack of angina  -No indication for heparin drip -ASA -Atorvastatin -No plans for inpatient ischemic evaluation given lack of anginal symptoms  -Outpatient follow-up  5.  Permanent A. fib: -Ventricular rates well controlled -In the setting of her underlying COPD exacerbation, initially beta-blockers were deferred, however she  did not tolerate diltiazem secondary to hypotension and in this setting was started on low-dose bisoprolol on 11/09/2020 -Remains on bisoprolol, if needed could rechallenge with diltiazem given active COPD exacerbation -CHA2DS2-VASc at least 6 (CHF, HTN, age x2, vascular disease, sex category) -Has previously not been managed with anticoagulation due to fall risk, defer management to primary cardiologist   6.  HTN: -Elevated BP upon presentation likely contributing to her volume status -Blood pressure much improved -Continue current therapy   For questions or updates, please contact [facility] Please consult [email or web address] for contact info under Cardiology/STEMI.    Signed, Eula Listen, PA-C [facility] Pager: [phone] 11/15/2020, 8:49 AM   Cardiology Attending  Patient seen and examined.  Agree with above. The patient has improvement in her dyspnea. She is frail appearing and in no distress. Lungs have rales in the bases. CV reveals an IRIR rhythm and no edema.  A/P 1. Acute on chronic HFPEF - her dyspnea is a little better today. Her BNP was elevated on admit and she will need aggressive diuresis 2. Atrial fib - her VR is controlled. Continue beta blocker 3. Elevated troponin - not thought to be an acute coronary syndrome. No plans for treatment except maintain rate control.  Sharlot Gowda Kaegan Hettich,MD

## 2020-11-16 LAB — CBC WITH DIFFERENTIAL/PLATELET
Abs Immature Granulocytes: 0.16 10*3/uL — ABNORMAL HIGH (ref 0.00–0.07)
Basophils Absolute: 0 10*3/uL (ref 0.0–0.1)
Basophils Relative: 0 %
Eosinophils Absolute: 0 10*3/uL (ref 0.0–0.5)
Eosinophils Relative: 0 %
HCT: 53.1 % — ABNORMAL HIGH (ref 36.0–46.0)
Hemoglobin: 17.5 g/dL — ABNORMAL HIGH (ref 12.0–15.0)
Immature Granulocytes: 1 %
Lymphocytes Relative: 4 %
Lymphs Abs: 0.7 10*3/uL (ref 0.7–4.0)
MCH: 30.6 pg (ref 26.0–34.0)
MCHC: 33 g/dL (ref 30.0–36.0)
MCV: 92.8 fL (ref 80.0–100.0)
Monocytes Absolute: 1 10*3/uL (ref 0.1–1.0)
Monocytes Relative: 6 %
Neutro Abs: 14.9 10*3/uL — ABNORMAL HIGH (ref 1.7–7.7)
Neutrophils Relative %: 89 %
Platelets: 265 10*3/uL (ref 150–400)
RBC: 5.72 MIL/uL — ABNORMAL HIGH (ref 3.87–5.11)
RDW: 14.2 % (ref 11.5–15.5)
WBC: 16.8 10*3/uL — ABNORMAL HIGH (ref 4.0–10.5)
nRBC: 0 % (ref 0.0–0.2)

## 2020-11-16 LAB — COMPREHENSIVE METABOLIC PANEL
ALT: 49 U/L — ABNORMAL HIGH (ref 0–44)
AST: 31 U/L (ref 15–41)
Albumin: 3.6 g/dL (ref 3.5–5.0)
Alkaline Phosphatase: 47 U/L (ref 38–126)
Anion gap: 12 (ref 5–15)
BUN: 47 mg/dL — ABNORMAL HIGH (ref 8–23)
CO2: 31 mmol/L (ref 22–32)
Calcium: 8.3 mg/dL — ABNORMAL LOW (ref 8.9–10.3)
Chloride: 90 mmol/L — ABNORMAL LOW (ref 98–111)
Creatinine, Ser: 1.01 mg/dL — ABNORMAL HIGH (ref 0.44–1.00)
GFR, Estimated: 52 mL/min — ABNORMAL LOW (ref >60)
Glucose, Bld: 152 mg/dL — ABNORMAL HIGH (ref 70–99)
Potassium: 4 mmol/L (ref 3.5–5.1)
Sodium: 133 mmol/L — ABNORMAL LOW (ref 135–145)
Total Bilirubin: 1.1 mg/dL (ref 0.3–1.2)
Total Protein: 6.9 g/dL (ref 6.5–8.1)

## 2020-11-16 LAB — BRAIN NATRIURETIC PEPTIDE: B Natriuretic Peptide: 1903.2 pg/mL — ABNORMAL HIGH (ref 0.0–100.0)

## 2020-11-16 MED ORDER — FUROSEMIDE 10 MG/ML IJ SOLN: 40.0000 mg | Freq: Every day | INTRAMUSCULAR | Status: DC

## 2020-11-16 MED ORDER — FUROSEMIDE 40 MG PO TABS
40.0000 mg | ORAL_TABLET | Freq: Every day | ORAL | Status: DC
  Administered 2020-11-17 – 2020-11-18 (×2): 40 mg via ORAL
  Filled 2020-11-16 (×2): qty 1

## 2020-11-16 MED ORDER — IPRATROPIUM-ALBUTEROL 0.5-2.5 (3) MG/3ML IN SOLN
3.0000 mL | Freq: Four times a day (QID) | RESPIRATORY_TRACT | Status: DC
  Administered 2020-11-16 – 2020-11-17 (×3): 3 mL via RESPIRATORY_TRACT
  Filled 2020-11-16 (×3): qty 3

## 2020-11-16 MED ORDER — ENOXAPARIN SODIUM 40 MG/0.4ML IJ SOSY
40.0000 mg | PREFILLED_SYRINGE | INTRAMUSCULAR | Status: DC
  Administered 2020-11-17: 40 mg via SUBCUTANEOUS
  Filled 2020-11-16: qty 0.4

## 2020-11-16 NOTE — Progress Notes (Signed)
Progress Note  Patient Name: Bridget [name] Date of Encounter: 11/16/2020  Primary Cardiologist: Julien Nordmann, MD   Subjective   No chest pain.  No shortness of breath at rest.  Does report orthopnea and requires the head of her bed to be elevated at a 45 degree angle, as she reports shortness of breath if lowered any more flat.  She was able to ambulate around the telemetry floor today with dyspnea noted with exertion.  No presyncope or concern for falls. She reports slowing of her urine output and overall improvement in her breathing status.  Inpatient Medications    Scheduled Meds: . aspirin  81 mg Oral Daily  . atorvastatin  10 mg Oral QHS  . bisoprolol  5 mg Oral Daily  . enoxaparin (LOVENOX) injection  30 mg Subcutaneous Q24H  . furosemide  40 mg Intravenous Q12H  . guaiFENesin  600 mg Oral BID  . ipratropium-albuterol  3 mL Nebulization Q6H  . predniSONE  40 mg Oral Q breakfast   Continuous Infusions: . cefTRIAXone (ROCEPHIN)  IV 1 g (11/15/20 2131)   PRN Meds: acetaminophen **OR** acetaminophen, chlorpheniramine-HYDROcodone, magnesium hydroxide, ondansetron **OR** ondansetron (ZOFRAN) IV, traZODone   Vital Signs    Vitals:   11/16/20 0609 11/16/20 0831 11/16/20 1137 11/16/20 1617  BP: 112/79 109/73 124/85 110/62  Pulse: 61 82 93 95  Resp: 14 17 16 17   Temp: 97.9 F (36.6 C) 97.9 F (36.6 C) 98 F (36.7 C) 98 F (36.7 C)  TempSrc:      SpO2: 92% 99% 95% 94%  Weight: 66 kg     Height:        Intake/Output Summary (Last 24 hours) at 11/16/2020 1709 Last data filed at 11/16/2020 1352 Gross per 24 hour  Intake 240 ml  Output 1650 ml  Net -1410 ml   Last 3 Weights 11/16/2020 11/15/2020 11/13/2020  Weight (lbs) 145 lb 8.1 oz 137 lb 5.6 oz 135 lb  Weight (kg) 66 kg 62.3 kg 61.236 kg      Telemetry    Coarse atrial fibrillation, PVCs - Personally Reviewed  ECG    No new tracings- Personally Reviewed  Physical Exam   GEN:  Elderly female.  No acute  distress.  Sitting on edge of bed completing a coloring book. Neck: No JVD Cardiac:  IR IR with controlled ventricular rate, no murmurs, rubs, or gallops.  Respiratory:  Bilateral diffuse wheezing GI: Soft, nontender, non-distended  MS:  Mild to moderate bilateral edema, lower extremity skin changes consistent with lymphedema; No deformity. Neuro:  Nonfocal  Psych: Normal affect   Labs    High Sensitivity Troponin:   Recent Labs  Lab 11/09/20 0432 11/13/20 2105 11/14/20 0019 11/14/20 0546 11/15/20 0523  TROPONINIHS 348* 164* 136* 92* 81*      Chemistry Recent Labs  Lab 11/13/20 2105 11/14/20 0709 11/15/20 1808 11/16/20 1415  NA 137 137 137 133*  K 5.0 3.9 3.7 4.0  CL 96* 92* 91* 90*  CO2 29 32 32 31  GLUCOSE 126* 166* 146* 152*  BUN 34* 32* 53* 47*  CREATININE 0.91 0.87 1.17* 1.01*  CALCIUM 9.1 8.7* 9.4 8.3*  PROT 7.2  --   --  6.9  ALBUMIN 4.0  --   --  3.6  AST 33  --   --  31  ALT 84*  --   --  49*  ALKPHOS 53  --   --  47  BILITOT 1.2  --   --  1.1  GFRNONAA 59* >60 44* 52*  ANIONGAP 12 13 14 12      Hematology Recent Labs  Lab 11/14/20 0546 11/15/20 1808 11/16/20 1415  WBC 9.9 18.8* 16.8*  RBC 5.41* 5.56* 5.72*  HGB 16.9* 17.2* 17.5*  HCT 51.2* 51.9* 53.1*  MCV 94.6 93.3 92.8  MCH 31.2 30.9 30.6  MCHC 33.0 33.1 33.0  RDW 14.2 13.9 14.2  PLT 237 275 265    BNP Recent Labs  Lab 11/13/20 2105 11/16/20 1415  BNP >4,500.0* 1,903.2*     DDimer No results for input(s): DDIMER in the last 168 hours.   Radiology    No results found.  Cardiac Studies   2D echo 11/09/2020: 1. Left ventricular ejection fraction, by estimation, is 55 %. The left  ventricle has normal function. The left ventricle has no regional wall  motion abnormalities.  2. Right ventricular systolic function is normal. The right ventricular  size is mildly enlarged. There is moderately elevated pulmonary artery  systolic pressure. The estimated right ventricular systolic  pressure is  56.0 mmHg.  3. Left atrial size was mildly dilated.  4. Right atrial size was moderately dilated.  5. The mitral valve is normal in structure. Mild to moderate mitral valve  regurgitation  6. The inferior vena cava is dilated in size with <50% respiratory  variability, suggesting right atrial pressure of 15 mmHg. __________  2D echo 08/31/2020: 1. Left ventricular ejection fraction, by estimation, is 60 to 65%. The  left ventricle has normal function. The left ventricle has no regional  wall motion abnormalities. There is mild left ventricular hypertrophy.  Left ventricular diastolic parameters  are consistent with Grade I diastolic dysfunction (impaired relaxation).  2. Right ventricular systolic function is normal. The right ventricular  size is normal.  3. Right atrial size was mildly dilated.  4. The mitral valve is grossly normal. Mild mitral valve regurgitation.  5. The aortic valve was not well visualized. Aortic valve regurgitation  is mild.   Patient Profile     87 y.o. female with history of HFpEF, permanent atrial fibrillation, chronic hypercapnic and hypoxic respiratory failure on supplemental oxygen secondary to COPD and asthma, mild dementia, and hypertension, and is being seen today for the evaluation of volume overload.  Assessment & Plan    Acute on chronic HFpEF -- Reports improvement in breathing status on IV diuresis.  Most recent echo as above with EF 55%, RVSP 56.0 mmHg, bilateral atrial enlargement, RAP 15 mmHg.  Today, ongoing orthopnea and mild dyspnea reported, though she notes chronic two-pillow orthopnea at home for some time now.  Continue to monitor I/os with net -2.3 L as of yesterday with total output 952 cc.  Daily BMET to monitor renal function electrolytes.  Baseline renal function appears between Cr 08.-0.9 and bumped over the last 2 days with Lasix IV 40 mg twice daily.  Volume status today consistent with euvolemia and patient  appears to be approaching her dry weight.  Suspect she is ready to transition over to oral diuresis.  We will transition from IV Lasix to oral Lasix 40 mg daily, as PTA medications included Lasix 20 mg daily. Wean steroids as tolerated.  Close monitoring of renal function recommended, especially with consideration of darker urine noted from her catheter today.  Acute on chronic COPD -- Per internal medicine  Elevated high-sensitivity troponin -- No chest pain.  Troponin minimally elevated and downtrending.  Peaked at 600.  No indication for IV heparin.  Continue  ASA, atorvastatin.  No plans for invasive ischemic work-up.  Further work-up if needed at outpatient follow-up.  Permanent atrial fibrillation -- Remains in atrial fibrillation with ventricular rates well controlled.  Initially beta-blockers were deferred in the setting of her COPD exacerbation; however, she did not tolerate diltiazem secondary to hypotension and was last started on low-dose bisoprolol 11/09/2020.  She remains on bisoprolol at this time.  Given a CHA2DS2-VASc score of at least 6 (CHF, hypertension, age x2, vascular disease, female), anticoagulation recommended.  However, due to her history of fall risk, she has previously not been maintained on anticoagulation.  Will defer to her primary cardiologist as bleeding risk of potential fall may outweigh the benefit associated with anticoagulation at this time.  Essential hypertension -- BP well controlled.  Continue current medications.  For questions or updates, please contact [facility] Please consult [email or web address] for contact info under        Signed, Lennon Alstrom, PA-C  11/16/2020, 5:09 PM

## 2020-11-16 NOTE — Progress Notes (Signed)
[facility]  PROGRESS NOTE  TRUDY KORY MBW:[id number] DOB: 01/22/1926 DOA: 11/13/2020 PCP: Glori Bickers, MD   Brief HPI:   87 year old female with history of HFpEF, asthma/COPD, stage III CKD, hypertension, mild dementia, permanent atrial fibrillation not on anticoagulation who was just admitted to the hospital with acute CHF exacerbation and COPD exacerbation from 11/07/2020 to 11/10/2020, presented to the hospital with worsening shortness of breath, cough, wheezing.  In the ED, EKG showed atrial fibrillation with controlled ventricular response, BNP was elevated more than 4500.  Patient was started on IV Lasix, cardiology was consulted    Subjective   Patient seen and examined, denies chest pain, has mild shortness of breath   Assessment/Plan:     Acute on chronic diastolic heart failure -Started on IV Lasix 40 mg every 12 hour -BNP was greater than 4500 on admit; today BNP is 1900 -Net -2.7 L; strict intake and output -Cardiology following   COPD exacerbation -Still has bilateral rhonchi on auscultation -Change DuoNeb nebulizer to every 6 hours -Continue prednisone 40 mg daily -Mucinex 600 mg p.o. twice daily -Continue ceftriaxone; stop date 11/18/2020   Leukocytosis -WBC 16,000 -Likely demargination from steroids   Hypertension -Continue bisoprolol       Scheduled medications:   . aspirin  81 mg Oral Daily  . atorvastatin  10 mg Oral QHS  . bisoprolol  5 mg Oral Daily  . enoxaparin (LOVENOX) injection  30 mg Subcutaneous Q24H  . furosemide  40 mg Intravenous Q12H  . guaiFENesin  600 mg Oral BID  . ipratropium-albuterol  3 mL Nebulization Q6H  . predniSONE  40 mg Oral Q breakfast         Data Reviewed:   CBG:  No results for input(s): GLUCAP in the last 168 hours.  SpO2: 94 % O2 Flow Rate (L/min): 2 L/min    Vitals:   11/16/20 0609 11/16/20 0831 11/16/20 1137 11/16/20 1617  BP: 112/79 109/73 124/85 110/62  Pulse: 61 82 93 95   Resp: 14 17 16 17   Temp: 97.9 F (36.6 C) 97.9 F (36.6 C) 98 F (36.7 C) 98 F (36.7 C)  TempSrc:      SpO2: 92% 99% 95% 94%  Weight: 66 kg     Height:         Intake/Output Summary (Last 24 hours) at 11/16/2020 1652 Last data filed at 11/16/2020 1352 Gross per 24 hour  Intake 240 ml  Output 1650 ml  Net -1410 ml    06/04 1901 - 06/06 0700 In: 560 [P.O.:360] Out: 1550 [Urine:1550]  Filed Weights   11/13/20 2042 11/15/20 0500 11/16/20 0609  Weight: 61.2 kg 62.3 kg 66 kg    CBC:  Recent Labs  Lab 11/13/20 2105 11/14/20 0546 11/15/20 1808 11/16/20 1415  WBC 13.2* 9.9 18.8* 16.8*  HGB 16.9* 16.9* 17.2* 17.5*  HCT 50.7* 51.2* 51.9* 53.1*  PLT 260 237 275 265  MCV 94.1 94.6 93.3 92.8  MCH 31.4 31.2 30.9 30.6  MCHC 33.3 33.0 33.1 33.0  RDW 13.9 14.2 13.9 14.2  LYMPHSABS 1.6  --  0.5* 0.7  MONOABS 1.2*  --  1.2* 1.0  EOSABS 0.1  --  0.0 0.0  BASOSABS 0.0  --  0.0 0.0    Complete metabolic panel:  Recent Labs  Lab 11/13/20 2105 11/14/20 0709 11/15/20 1808 11/16/20 1415  NA 137 137 137 133*  K 5.0 3.9 3.7 4.0  CL 96* 92* 91* 90*  CO2 29 32 32 31  GLUCOSE 126* 166* 146* 152*  BUN 34* 32* 53* 47*  CREATININE 0.91 0.87 1.17* 1.01*  CALCIUM 9.1 8.7* 9.4 8.3*  AST 33  --   --  31  ALT 84*  --   --  49*  ALKPHOS 53  --   --  47  BILITOT 1.2  --   --  1.1  ALBUMIN 4.0  --   --  3.6  BNP >4,500.0*  --   --  1,903.2*    No results for input(s): LIPASE, AMYLASE in the last 168 hours.  Recent Labs  Lab 11/13/20 2105 11/13/20 2217 11/16/20 1415  BNP >4,500.0*  --  1,903.2*  SARSCOV2NAA  --  NEGATIVE  --     ------------------------------------------------------------------------------------------------------------------ No results for input(s): CHOL, HDL, LDLCALC, TRIG, CHOLHDL, LDLDIRECT in the last 72 hours.  No results found for:  HGBA1C ------------------------------------------------------------------------------------------------------------------ No results for input(s): TSH, T4TOTAL, T3FREE, THYROIDAB in the last 72 hours.  Invalid input(s): FREET3 ------------------------------------------------------------------------------------------------------------------ No results for input(s): VITAMINB12, FOLATE, FERRITIN, TIBC, IRON, RETICCTPCT in the last 72 hours.  Coagulation profile No results for input(s): INR, PROTIME in the last 168 hours. No results for input(s): DDIMER in the last 72 hours.  Cardiac Enzymes No results for input(s): CKTOTAL, CKMB, CKMBINDEX, TROPONINI in the last 168 hours.  ------------------------------------------------------------------------------------------------------------------    Component Value Date/Time   BNP 1,903.2 (H) 11/16/2020 1415     Antibiotics: Anti-infectives (From admission, onward)   Start     Dose/Rate Route Frequency Ordered Stop   11/13/20 2245  cefTRIAXone (ROCEPHIN) 1 g in sodium chloride 0.9 % 100 mL IVPB        1 g 200 mL/hr over 30 Minutes Intravenous Every 24 hours 11/13/20 2230 11/18/20 2229       Radiology Reports  No results found.    DVT prophylaxis: Lovenox  Code Status: Partial code/no intubation or mechanical ventilation  Family Communication: No family at bedside   Consultants:  Cardiology  Procedures:      Objective    Physical Examination:   General-appears in no acute distress Heart-S1-S2, regular, no murmur auscultated Lungs-bilateral rhonchi auscultated Abdomen-soft, nontender, no organomegaly Extremities-no edema in the lower extremities Neuro-alert, oriented x3, no focal deficit noted  Status is: Inpatient  Dispo: The patient is from: Home              Anticipated d/c is to: Home              Anticipated d/c date is: 11/18/2020              Patient currently not stable for discharge  Barrier to  discharge-COPD exacerbation  COVID-19 Labs  No results for input(s): DDIMER, FERRITIN, LDH, CRP in the last 72 hours.  Lab Results  Component Value Date   SARSCOV2NAA NEGATIVE 11/13/2020   SARSCOV2NAA NEGATIVE 11/07/2020   SARSCOV2NAA NEGATIVE 09/04/2020   SARSCOV2NAA NEGATIVE 08/31/2020    Microbiology  Recent Results (from the past 240 hour(s))  Resp Panel by RT-PCR (Flu A&B, Covid) Nasopharyngeal Swab     Status: None   Collection Time: 11/07/20  6:21 AM   Specimen: Nasopharyngeal Swab; Nasopharyngeal(NP) swabs in vial transport medium  Result Value Ref Range Status   SARS Coronavirus 2 by RT PCR NEGATIVE NEGATIVE Final    Comment: (NOTE) SARS-CoV-2 target nucleic acids are NOT DETECTED.  The SARS-CoV-2 RNA is generally detectable in upper respiratory specimens during the acute phase of infection. The lowest concentration of SARS-CoV-2 viral copies this  assay can detect is 138 copies/mL. A negative result does not preclude SARS-Cov-2 infection and should not be used as the sole basis for treatment or other patient management decisions. A negative result may occur with  improper specimen collection/handling, submission of specimen other than nasopharyngeal swab, presence of viral mutation(s) within the areas targeted by this assay, and inadequate number of viral copies(<138 copies/mL). A negative result must be combined with clinical observations, patient history, and epidemiological information. The expected result is Negative.  Fact Sheet for Patients:  BloggerCourse.com  Fact Sheet for Healthcare Providers:  SeriousBroker.it  This test is no t yet approved or cleared by the [place] FDA and  has been authorized for detection and/or diagnosis of SARS-CoV-2 by FDA under an Emergency Use Authorization (EUA). This EUA will remain  in effect (meaning this test can be used) for the duration of the COVID-19  declaration under Section 564(b)(1) of the Act, 21 U.S.C.section 360bbb-3(b)(1), unless the authorization is terminated  or revoked sooner.       Influenza A by PCR NEGATIVE NEGATIVE Final   Influenza B by PCR NEGATIVE NEGATIVE Final    Comment: (NOTE) The Xpert Xpress SARS-CoV-2/FLU/RSV plus assay is intended as an aid in the diagnosis of influenza from Nasopharyngeal swab specimens and should not be used as a sole basis for treatment. Nasal washings and aspirates are unacceptable for Xpert Xpress SARS-CoV-2/FLU/RSV testing.  Fact Sheet for Patients: BloggerCourse.com  Fact Sheet for Healthcare Providers: SeriousBroker.it  This test is not yet approved or cleared by the [place] FDA and has been authorized for detection and/or diagnosis of SARS-CoV-2 by FDA under an Emergency Use Authorization (EUA). This EUA will remain in effect (meaning this test can be used) for the duration of the COVID-19 declaration under Section 564(b)(1) of the Act, 21 U.S.C. section 360bbb-3(b)(1), unless the authorization is terminated or revoked.  Performed at [facility], [address] [place], [place] [address]   Resp Panel by RT-PCR (Flu A&B, Covid) Nasopharyngeal Swab     Status: None   Collection Time: 11/13/20 10:17 PM   Specimen: Nasopharyngeal Swab; Nasopharyngeal(NP) swabs in vial transport medium  Result Value Ref Range Status   SARS Coronavirus 2 by RT PCR NEGATIVE NEGATIVE Final    Comment: (NOTE) SARS-CoV-2 target nucleic acids are NOT DETECTED.  The SARS-CoV-2 RNA is generally detectable in upper respiratory specimens during the acute phase of infection. The lowest concentration of SARS-CoV-2 viral copies this assay can detect is 138 copies/mL. A negative result does not preclude SARS-Cov-2 infection and should not be used as the sole basis for treatment or other patient management decisions. A negative result  may occur with  improper specimen collection/handling, submission of specimen other than nasopharyngeal swab, presence of viral mutation(s) within the areas targeted by this assay, and inadequate number of viral copies(<138 copies/mL). A negative result must be combined with clinical observations, patient history, and epidemiological information. The expected result is Negative.  Fact Sheet for Patients:  BloggerCourse.com  Fact Sheet for Healthcare Providers:  SeriousBroker.it  This test is no t yet approved or cleared by the [place] FDA and  has been authorized for detection and/or diagnosis of SARS-CoV-2 by FDA under an Emergency Use Authorization (EUA). This EUA will remain  in effect (meaning this test can be used) for the duration of the COVID-19 declaration under Section 564(b)(1) of the Act, 21 U.S.C.section 360bbb-3(b)(1), unless the authorization is terminated  or revoked sooner.  Influenza A by PCR NEGATIVE NEGATIVE Final   Influenza B by PCR NEGATIVE NEGATIVE Final    Comment: (NOTE) The Xpert Xpress SARS-CoV-2/FLU/RSV plus assay is intended as an aid in the diagnosis of influenza from Nasopharyngeal swab specimens and should not be used as a sole basis for treatment. Nasal washings and aspirates are unacceptable for Xpert Xpress SARS-CoV-2/FLU/RSV testing.  Fact Sheet for Patients: BloggerCourse.comhttps://www.fda.gov/media/152166/download  Fact Sheet for Healthcare Providers: SeriousBroker.ithttps://www.fda.gov/media/152162/download  This test is not yet approved or cleared by the [place] FDA and has been authorized for detection and/or diagnosis of SARS-CoV-2 by FDA under an Emergency Use Authorization (EUA). This EUA will remain in effect (meaning this test can be used) for the duration of the COVID-19 declaration under Section 564(b)(1) of the Act, 21 U.S.C. section 360bbb-3(b)(1), unless the authorization is terminated  or revoked.  Performed at [facility], [address] [place], [place] [id number]              Meredeth IdeGagan S Denny Lave   [facility] If 7PM-7AM, please contact night-coverage at [email or web address], Office  [phone]   11/16/2020, 4:52 PM  LOS: 3 days

## 2020-11-16 NOTE — Care Management Important Message (Signed)
Important Message  Patient Details  Name: Bridget [name] MRN: [id number] Date of Birth: [birth year]   Medicare Important Message Given:  Yes     Johnell Comings 11/16/2020, 11:35 AM

## 2020-11-17 DIAGNOSIS — R0902 Hypoxemia: Secondary | ICD-10-CM

## 2020-11-17 DIAGNOSIS — R0602 Shortness of breath: Secondary | ICD-10-CM

## 2020-11-17 LAB — CBC
HCT: 51.5 % — ABNORMAL HIGH (ref 36.0–46.0)
Hemoglobin: 17.1 g/dL — ABNORMAL HIGH (ref 12.0–15.0)
MCH: 30.7 pg (ref 26.0–34.0)
MCHC: 33.2 g/dL (ref 30.0–36.0)
MCV: 92.5 fL (ref 80.0–100.0)
Platelets: 239 10*3/uL (ref 150–400)
RBC: 5.57 MIL/uL — ABNORMAL HIGH (ref 3.87–5.11)
RDW: 14.2 % (ref 11.5–15.5)
WBC: 11.4 10*3/uL — ABNORMAL HIGH (ref 4.0–10.5)
nRBC: 0 % (ref 0.0–0.2)

## 2020-11-17 LAB — COMPREHENSIVE METABOLIC PANEL
ALT: 39 U/L (ref 0–44)
AST: 25 U/L (ref 15–41)
Albumin: 3.1 g/dL — ABNORMAL LOW (ref 3.5–5.0)
Alkaline Phosphatase: 38 U/L (ref 38–126)
Anion gap: 13 (ref 5–15)
BUN: 41 mg/dL — ABNORMAL HIGH (ref 8–23)
CO2: 30 mmol/L (ref 22–32)
Calcium: 8.7 mg/dL — ABNORMAL LOW (ref 8.9–10.3)
Chloride: 95 mmol/L — ABNORMAL LOW (ref 98–111)
Creatinine, Ser: 0.84 mg/dL (ref 0.44–1.00)
GFR, Estimated: >60 mL/min (ref >60)
Glucose, Bld: 110 mg/dL — ABNORMAL HIGH (ref 70–99)
Potassium: 3.8 mmol/L (ref 3.5–5.1)
Sodium: 138 mmol/L (ref 135–145)
Total Bilirubin: 0.7 mg/dL (ref 0.3–1.2)
Total Protein: 5.9 g/dL — ABNORMAL LOW (ref 6.5–8.1)

## 2020-11-17 MED ORDER — IPRATROPIUM-ALBUTEROL 0.5-2.5 (3) MG/3ML IN SOLN
3.0000 mL | Freq: Two times a day (BID) | RESPIRATORY_TRACT | Status: DC
  Administered 2020-11-18: 3 mL via RESPIRATORY_TRACT
  Filled 2020-11-17: qty 3

## 2020-11-17 NOTE — Progress Notes (Signed)
Progress Note  Patient Name: Bridget [name] Date of Encounter: 11/17/2020  Primary Cardiologist: Julien Nordmann, MD   Subjective   No chest pain.   No shortness of breath at rest.   Reports improved breathing status.  Stable orthopnea from that at home.  RLE TTP.   She was able to ambulate around the telemetry floor yesterday with dyspnea noted with exertion.  We discussed ambulating again today and monitoring her breathing.  No presyncope or concern for falls.   Inpatient Medications    Scheduled Meds: . aspirin  81 mg Oral Daily  . atorvastatin  10 mg Oral QHS  . bisoprolol  5 mg Oral Daily  . enoxaparin (LOVENOX) injection  40 mg Subcutaneous Q24H  . furosemide  40 mg Oral Daily  . guaiFENesin  600 mg Oral BID  . ipratropium-albuterol  3 mL Nebulization Q6H  . predniSONE  40 mg Oral Q breakfast   Continuous Infusions: . cefTRIAXone (ROCEPHIN)  IV 1 g (11/16/20 2115)   PRN Meds: acetaminophen **OR** acetaminophen, chlorpheniramine-HYDROcodone, magnesium hydroxide, ondansetron **OR** ondansetron (ZOFRAN) IV, traZODone   Vital Signs    Vitals:   11/17/20 0304 11/17/20 0500 11/17/20 0736 11/17/20 0750  BP: 110/82   106/68  Pulse: 75   (!) 54  Resp: 18   18  Temp: 97.6 F (36.4 C)   97.8 F (36.6 C)  TempSrc: Oral   Oral  SpO2: 92%  91% 100%  Weight:  65.6 kg    Height:        Intake/Output Summary (Last 24 hours) at 11/17/2020 0829 Last data filed at 11/16/2020 1858 Gross per 24 hour  Intake 360 ml  Output 700 ml  Net -340 ml   Last 3 Weights 11/17/2020 11/16/2020 11/15/2020  Weight (lbs) 144 lb 10 oz 145 lb 8.1 oz 137 lb 5.6 oz  Weight (kg) 65.6 kg 66 kg 62.3 kg      Telemetry    Coarse atrial fibrillation, PVCs, 60-80s  - Personally Reviewed  ECG    No new tracings- Personally Reviewed  Physical Exam   GEN:  Elderly female.  No acute distress.  Sitting on edge of bed eating breakfast. Neck: No JVD Cardiac:  IR IR with controlled ventricular rate,  no murmurs, rubs, or gallops.  Respiratory:  Bilateral wheezing improved from yesterday GI: Soft, nontender, non-distended  MS:  Mild to moderate bilateral edema, lower extremity skin changes consistent with lymphedema, reports RLE TTP as she did yesterday; No deformity. Neuro:  Nonfocal  Psych: Normal affect   Labs    High Sensitivity Troponin:   Recent Labs  Lab 11/09/20 0432 11/13/20 2105 11/14/20 0019 11/14/20 0546 11/15/20 0523  TROPONINIHS 348* 164* 136* 92* 81*      Chemistry Recent Labs  Lab 11/13/20 2105 11/14/20 0709 11/15/20 1808 11/16/20 1415 11/17/20 0436  NA 137   < > 137 133* 138  K 5.0   < > 3.7 4.0 3.8  CL 96*   < > 91* 90* 95*  CO2 29   < > 32 31 30  GLUCOSE 126*   < > 146* 152* 110*  BUN 34*   < > 53* 47* 41*  CREATININE 0.91   < > 1.17* 1.01* 0.84  CALCIUM 9.1   < > 9.4 8.3* 8.7*  PROT 7.2  --   --  6.9 5.9*  ALBUMIN 4.0  --   --  3.6 3.1*  AST 33  --   --  31 25  ALT 84*  --   --  49* 39  ALKPHOS 53  --   --  47 38  BILITOT 1.2  --   --  1.1 0.7  GFRNONAA 59*   < > 44* 52* >60  ANIONGAP 12   < > 14 12 13    < > = values in this interval not displayed.     Hematology Recent Labs  Lab 11/15/20 1808 11/16/20 1415 11/17/20 0436  WBC 18.8* 16.8* 11.4*  RBC 5.56* 5.72* 5.57*  HGB 17.2* 17.5* 17.1*  HCT 51.9* 53.1* 51.5*  MCV 93.3 92.8 92.5  MCH 30.9 30.6 30.7  MCHC 33.1 33.0 33.2  RDW 13.9 14.2 14.2  PLT 275 265 239    BNP Recent Labs  Lab 11/13/20 2105 11/16/20 1415  BNP >4,500.0* 1,903.2*     DDimer No results for input(s): DDIMER in the last 168 hours.   Radiology    No results found.  Cardiac Studies   2D echo 11/09/2020: 1. Left ventricular ejection fraction, by estimation, is 55 %. The left  ventricle has normal function. The left ventricle has no regional wall  motion abnormalities.  2. Right ventricular systolic function is normal. The right ventricular  size is mildly enlarged. There is moderately elevated  pulmonary artery  systolic pressure. The estimated right ventricular systolic pressure is  56.0 mmHg.  3. Left atrial size was mildly dilated.  4. Right atrial size was moderately dilated.  5. The mitral valve is normal in structure. Mild to moderate mitral valve  regurgitation  6. The inferior vena cava is dilated in size with <50% respiratory  variability, suggesting right atrial pressure of 15 mmHg. __________  2D echo 08/31/2020: 1. Left ventricular ejection fraction, by estimation, is 60 to 65%. The  left ventricle has normal function. The left ventricle has no regional  wall motion abnormalities. There is mild left ventricular hypertrophy.  Left ventricular diastolic parameters  are consistent with Grade I diastolic dysfunction (impaired relaxation).  2. Right ventricular systolic function is normal. The right ventricular  size is normal.  3. Right atrial size was mildly dilated.  4. The mitral valve is grossly normal. Mild mitral valve regurgitation.  5. The aortic valve was not well visualized. Aortic valve regurgitation  is mild.   Patient Profile     87 y.o. female with history of HFpEF, permanent atrial fibrillation, chronic hypercapnic and hypoxic respiratory failure on supplemental oxygen secondary to COPD and asthma, mild dementia, and hypertension, and is being seen today for the evaluation of volume overload.  Assessment & Plan    Acute on chronic HFpEF -- Reports improvement in breathing status on IV diuresis.  Most recent echo as above with EF 55%, RVSP 56.0 mmHg, bilateral atrial enlargement, RAP 15 mmHg.  Transitioned to oral lasix 40mg  daily yesterday. Continue to monitor I/os with net -2.6 L as of yesterday with total output -340cc.  Cr stable to slightly improved today and back to her baseline with transition to oral lasix. Wean steroids as tolerated.    Acute on chronic COPD -- Per internal medicine  Elevated high-sensitivity troponin -- No chest  pain.  Troponin minimally elevated and downtrending.  Peaked at 600.  No indication for IV heparin.  Continue ASA, atorvastatin.  No plans for invasive ischemic work-up.  Further work-up if needed at outpatient follow-up.  Permanent atrial fibrillation -- Remains in atrial fibrillation with ventricular rates well controlled.  Initially beta-blockers were deferred in the  setting of her COPD exacerbation; however, she did not tolerate diltiazem secondary to hypotension and was last started on low-dose bisoprolol 11/09/2020.  She remains on bisoprolol at this time.   CHA2DS2-VASc score of at least 6 (CHF, hypertension, age x2, vascular disease, female); however, due to her history of fall, no anticoagulation as bleeding risk / risk of potential fall outweighs the benefit associated with anticoagulation at this time.  Essential hypertension -- BP well controlled to soft.  Continue current medications.  For questions or updates, please contact [facility] Please consult [email or web address] for contact info under        Signed, Lennon Alstrom, PA-C  11/17/2020, 8:29 AM

## 2020-11-17 NOTE — Consult Note (Signed)
   Heart Failure Nurse Navigator Note  HFpEF 55%  Shortness of breath with cough and wheeze.  BNP was greater than 4000. She has a  history of recent hospitalization from May 28 to the 31.   Comorbidities:  COPD/asthma Hypertension Chronic kidney disease stage III Chronic atrial fibrillation  Medications:  Aspirin 81 mg daily Atorvastatin 10 mill grams daily Bisoprolol 5 mg daily Furosemide 40 mg daily  Labs: Sodium 138, potassium 3.8, chloride 95, CO2 30, BUN 41, creatinine, AST 5, ALT 39 Weight is 65.6 kg Intake 360 mL  Output 700 mL  Initial meeting with patient today.  She states that prior to this hospitalization she has been on home.  But now will be staying with daughter [name].  She states [name] will be doing the majority of the cooking.  States they do use salt with cooking.  Discouraged from adding at the table.  Talked about lean meats, fresh or frozen vegetables.  She states they very seldom have canned soups or convienice foods.  States they rarely eat in restaurants.  Went over weighing daily and recording, what to report to physician.  She voices understanding  Was given living with heart failure materials, zone magnet.  Also discussed follow up in the outpatient heart failure clinic with appointment time.  Tresa Endo RN [facility]

## 2020-11-17 NOTE — Progress Notes (Signed)
[facility]  PROGRESS NOTE  Bridget [name] XLK:[id number] DOB: 1925-08-30 DOA: 11/13/2020 PCP: Glori Bickers, MD   Brief HPI:   87 year old female with history of HFpEF, asthma/COPD, stage III CKD, hypertension, mild dementia, permanent atrial fibrillation not on anticoagulation who was just admitted to the hospital with acute CHF exacerbation and COPD exacerbation from 11/07/2020 to 11/10/2020, presented to the hospital with worsening shortness of breath, cough, wheezing.  In the ED, EKG showed atrial fibrillation with controlled ventricular response, BNP was elevated more than 4500.  Patient was started on IV Lasix, cardiology was consulted    Subjective   Patient seen, still has wheezing bilaterally.  O2 sats dropped to 86% on room air.  Cardiology has signed off.   Assessment/Plan:     Acute on chronic diastolic heart failure -Started on IV Lasix 40 mg every 12 hour; changed to Lasix 40 mg daily -BNP was greater than 4500 on admit; repeat BNP as of yesterday was 1900 -Net -2.3 L; strict intake and output -Cardiology has signed off.  - Recommend to continue Lasix 40 mg daily at home.   COPD exacerbation -Still has bilateral rhonchi on auscultation; hypoxemic on ambulation -Continue DuoNeb nebulizer to every 6 hours -Continue prednisone 40 mg daily; can be tapered at discharge -Mucinex 600 mg p.o. twice daily -Continue ceftriaxone; stop date 11/18/2020   Leukocytosis -WBC down to 11,000   Hypertension -Continue bisoprolol 5 mg daily at discharge       Scheduled medications:   . aspirin  81 mg Oral Daily  . atorvastatin  10 mg Oral QHS  . bisoprolol  5 mg Oral Daily  . enoxaparin (LOVENOX) injection  40 mg Subcutaneous Q24H  . furosemide  40 mg Oral Daily  . guaiFENesin  600 mg Oral BID  . ipratropium-albuterol  3 mL Nebulization Q6H  . predniSONE  40 mg Oral Q breakfast         Data Reviewed:   CBG:  No results for input(s): GLUCAP in the  last 168 hours.  SpO2: 98 % O2 Flow Rate (L/min): 2 L/min    Vitals:   11/17/20 0500 11/17/20 0736 11/17/20 0750 11/17/20 1115  BP:   106/68 101/72  Pulse:   (!) 54 77  Resp:   18 16  Temp:   97.8 F (36.6 C) 97.8 F (36.6 C)  TempSrc:   Oral Oral  SpO2:  91% 100% 98%  Weight: 65.6 kg     Height:         Intake/Output Summary (Last 24 hours) at 11/17/2020 1351 Last data filed at 11/17/2020 1036 Gross per 24 hour  Intake 480 ml  Output --  Net 480 ml    06/05 1901 - 06/07 0700 In: 360 [P.O.:360] Out: 1650 [Urine:1650]  Filed Weights   11/15/20 0500 11/16/20 0609 11/17/20 0500  Weight: 62.3 kg 66 kg 65.6 kg    CBC:  Recent Labs  Lab 11/13/20 2105 11/14/20 0546 11/15/20 1808 11/16/20 1415 11/17/20 0436  WBC 13.2* 9.9 18.8* 16.8* 11.4*  HGB 16.9* 16.9* 17.2* 17.5* 17.1*  HCT 50.7* 51.2* 51.9* 53.1* 51.5*  PLT 260 237 275 265 239  MCV 94.1 94.6 93.3 92.8 92.5  MCH 31.4 31.2 30.9 30.6 30.7  MCHC 33.3 33.0 33.1 33.0 33.2  RDW 13.9 14.2 13.9 14.2 14.2  LYMPHSABS 1.6  --  0.5* 0.7  --   MONOABS 1.2*  --  1.2* 1.0  --   EOSABS 0.1  --  0.0 0.0  --  BASOSABS 0.0  --  0.0 0.0  --     Complete metabolic panel:  Recent Labs  Lab 11/13/20 2105 11/14/20 0709 11/15/20 1808 11/16/20 1415 11/17/20 0436  NA 137 137 137 133* 138  K 5.0 3.9 3.7 4.0 3.8  CL 96* 92* 91* 90* 95*  CO2 29 32 32 31 30  GLUCOSE 126* 166* 146* 152* 110*  BUN 34* 32* 53* 47* 41*  CREATININE 0.91 0.87 1.17* 1.01* 0.84  CALCIUM 9.1 8.7* 9.4 8.3* 8.7*  AST 33  --   --  31 25  ALT 84*  --   --  49* 39  ALKPHOS 53  --   --  47 38  BILITOT 1.2  --   --  1.1 0.7  ALBUMIN 4.0  --   --  3.6 3.1*  BNP >4,500.0*  --   --  1,903.2*  --     No results for input(s): LIPASE, AMYLASE in the last 168 hours.  Recent Labs  Lab 11/13/20 2105 11/13/20 2217 11/16/20 1415  BNP >4,500.0*  --  1,903.2*  SARSCOV2NAA  --  NEGATIVE  --      ------------------------------------------------------------------------------------------------------------------ No results for input(s): CHOL, HDL, LDLCALC, TRIG, CHOLHDL, LDLDIRECT in the last 72 hours.  No results found for: HGBA1C ------------------------------------------------------------------------------------------------------------------ No results for input(s): TSH, T4TOTAL, T3FREE, THYROIDAB in the last 72 hours.  Invalid input(s): FREET3 ------------------------------------------------------------------------------------------------------------------ No results for input(s): VITAMINB12, FOLATE, FERRITIN, TIBC, IRON, RETICCTPCT in the last 72 hours.  Coagulation profile No results for input(s): INR, PROTIME in the last 168 hours. No results for input(s): DDIMER in the last 72 hours.  Cardiac Enzymes No results for input(s): CKTOTAL, CKMB, CKMBINDEX, TROPONINI in the last 168 hours.  ------------------------------------------------------------------------------------------------------------------    Component Value Date/Time   BNP 1,903.2 (H) 11/16/2020 1415     Antibiotics: Anti-infectives (From admission, onward)   Start     Dose/Rate Route Frequency Ordered Stop   11/13/20 2245  cefTRIAXone (ROCEPHIN) 1 g in sodium chloride 0.9 % 100 mL IVPB        1 g 200 mL/hr over 30 Minutes Intravenous Every 24 hours 11/13/20 2230 11/18/20 2229       Radiology Reports  No results found.    DVT prophylaxis: Lovenox  Code Status: Partial code/no intubation or mechanical ventilation  Family Communication: No family at bedside   Consultants:  Cardiology  Procedures:      Objective    Physical Examination:   General-appears in no acute distress Heart-S1-S2, regular, no murmur auscultated Lungs-bilateral rhonchi auscultated Abdomen-soft, nontender, no organomegaly Extremities-no edema in the lower extremities Neuro-alert, oriented x3, no focal  deficit noted  Status is: Inpatient  Dispo: The patient is from: Home              Anticipated d/c is to: Home              Anticipated d/c date is: 11/18/2020              Patient currently not stable for discharge  Barrier to discharge-COPD exacerbation  COVID-19 Labs  No results for input(s): DDIMER, FERRITIN, LDH, CRP in the last 72 hours.  Lab Results  Component Value Date   SARSCOV2NAA NEGATIVE 11/13/2020   SARSCOV2NAA NEGATIVE 11/07/2020   SARSCOV2NAA NEGATIVE 09/04/2020   SARSCOV2NAA NEGATIVE 08/31/2020    Microbiology  Recent Results (from the past 240 hour(s))  Resp Panel by RT-PCR (Flu A&B, Covid) Nasopharyngeal Swab     Status: None  Collection Time: 11/13/20 10:17 PM   Specimen: Nasopharyngeal Swab; Nasopharyngeal(NP) swabs in vial transport medium  Result Value Ref Range Status   SARS Coronavirus 2 by RT PCR NEGATIVE NEGATIVE Final    Comment: (NOTE) SARS-CoV-2 target nucleic acids are NOT DETECTED.  The SARS-CoV-2 RNA is generally detectable in upper respiratory specimens during the acute phase of infection. The lowest concentration of SARS-CoV-2 viral copies this assay can detect is 138 copies/mL. A negative result does not preclude SARS-Cov-2 infection and should not be used as the sole basis for treatment or other patient management decisions. A negative result may occur with  improper specimen collection/handling, submission of specimen other than nasopharyngeal swab, presence of viral mutation(s) within the areas targeted by this assay, and inadequate number of viral copies(<138 copies/mL). A negative result must be combined with clinical observations, patient history, and epidemiological information. The expected result is Negative.  Fact Sheet for Patients:  BloggerCourse.com  Fact Sheet for Healthcare Providers:  SeriousBroker.it  This test is no t yet approved or cleared by the [place]  FDA and  has been authorized for detection and/or diagnosis of SARS-CoV-2 by FDA under an Emergency Use Authorization (EUA). This EUA will remain  in effect (meaning this test can be used) for the duration of the COVID-19 declaration under Section 564(b)(1) of the Act, 21 U.S.C.section 360bbb-3(b)(1), unless the authorization is terminated  or revoked sooner.       Influenza A by PCR NEGATIVE NEGATIVE Final   Influenza B by PCR NEGATIVE NEGATIVE Final    Comment: (NOTE) The Xpert Xpress SARS-CoV-2/FLU/RSV plus assay is intended as an aid in the diagnosis of influenza from Nasopharyngeal swab specimens and should not be used as a sole basis for treatment. Nasal washings and aspirates are unacceptable for Xpert Xpress SARS-CoV-2/FLU/RSV testing.  Fact Sheet for Patients: BloggerCourse.com  Fact Sheet for Healthcare Providers: SeriousBroker.it  This test is not yet approved or cleared by the [place] FDA and has been authorized for detection and/or diagnosis of SARS-CoV-2 by FDA under an Emergency Use Authorization (EUA). This EUA will remain in effect (meaning this test can be used) for the duration of the COVID-19 declaration under Section 564(b)(1) of the Act, 21 U.S.C. section 360bbb-3(b)(1), unless the authorization is terminated or revoked.  Performed at [facility], [address] [place], [place] [address]              Meredeth Ide   [facility] If 7PM-7AM, please contact night-coverage at [email or web address], Office  [phone]   11/17/2020, 1:51 PM  LOS: 4 days

## 2020-11-18 MED ORDER — FUROSEMIDE 40 MG PO TABS: 40.0000 mg | ORAL_TABLET | Freq: Every day | ORAL | 1 refills | Status: DC

## 2020-11-18 MED ORDER — PREDNISONE 10 MG PO TABS: 20.0000 mg | ORAL_TABLET | Freq: Every day | ORAL | 0 refills | Status: AC

## 2020-11-18 NOTE — Progress Notes (Signed)
   Heart Failure Nurse Navigator Note  Met with patient briefly today,she requests that I call and speak with her about she states she just does not remember things like she should.  Called and spoke with daughter . Lattie Haw, [phone].  Discussed heart failure and taking care of herself at home, daily weight and recording, what to report to physician.  Also talked about the importance of the heart failure clinic.  She was scheduled for June 13 and then follow up appointment with cardiology the following day.  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   Pricilla Riffle RN [facility]

## 2020-11-18 NOTE — Discharge Summary (Signed)
Physician Discharge Summary  BERNETHA ANSCHUTZ MGQ:[id number] DOB: [birth year] DOA: 11/13/2020  PCP: Glori Bickers, MD  Admit date: 11/13/2020 Discharge date: 11/18/2020  Admitted From: home Disposition:  home  Recommendations for Outpatient Follow-up:  1. Follow up with PCP in 1-2 weeks  Home Health: none Equipment/Devices: none  Discharge Condition: stable CODE STATUS: Full code except intubation Diet recommendation: low sodium  HPI: Per admitting MD, JAYNI PRESCHER is a 87 y.o. Caucasian femalemale with medical history significant for heart failure with preserved ejection fraction, asthma/COPD, stage III chronic kidney disease, hypertension and mild dementia as well as permanent atrial fibrillation, not on anticoagulation due to fall risk, who was just admitted here for acute CHF exacerbation and COPD exacerbation from 5/28-5/31 and presents today to the emergency room with acute onset of worsening dyspnea with associated cough and wheezing over the last couple of days not significantly worse since yesterday.  No fever or chills.  No nausea vomiting or abdominal pain.  No chest pain or palpitations.  Since this morning she has been using more nebulizer treatment.  She denies any dysuria, oliguria or hematuria or flank pain.  She was supposed to get oxygen only on a as needed basis at home and mainly at night.  Hospital Course / Discharge diagnoses: Principal problem Acute on chronic diastolic heart failure -patient was felt to be fluid overloaded on admission given elevated BNP, she was diuresed with IV Lasix with excellent response.  Cardiology consulted and followed patient while hospitalized.  Her breathing has returned to baseline, she is able to ambulate in the hallway without difficulties, feels back to normal and will be discharged home in stable condition.  Her Lasix dose has been increased to 40 mg daily.  She was advised for daily weights and a low-sodium diet.  Active problems COPD  exacerbation -improved, continue home medications, she will also be placed on a quick prednisone taper.  She completed antibiotics while hospitalized. Hypertension -Continue home regimen on discharge Chronic kidney disease stage IIIa-creatinine is at baseline  Sepsis ruled out   Discharge Instructions   Allergies as of 11/18/2020   No Known Allergies     Medication List    TAKE these medications   albuterol 108 (90 Base) MCG/ACT inhaler Commonly known as: VENTOLIN HFA Inhale 2 puffs into the lungs every 6 (six) hours as needed for wheezing or shortness of breath.   aspirin 81 MG chewable tablet Chew 1 tablet (81 mg total) by mouth daily.   atorvastatin 10 MG tablet Commonly known as: LIPITOR Take 1 tablet (10 mg total) by mouth at bedtime.   bisoprolol 5 MG tablet Commonly known as: ZEBETA Take 1 tablet (5 mg total) by mouth daily.   furosemide 40 MG tablet Commonly known as: LASIX Take 1 tablet (40 mg total) by mouth daily. Start taking on: November 19, 2020 What changed:   medication strength  how much to take   ipratropium-albuterol 0.5-2.5 (3) MG/3ML Soln Commonly known as: DUONEB Take 3 mLs by nebulization 4 (four) times daily.   mometasone-formoterol 100-5 MCG/ACT Aero Commonly known as: DULERA Inhale 2 puffs into the lungs 2 (two) times daily.   predniSONE 10 MG tablet Commonly known as: DELTASONE Take 2 tablets (20 mg total) by mouth daily for 5 days. What changed:   medication strength  how much to take  how to take this  when to take this  additional instructions   Spacer/Aero-Holding Rudean Curt 1 Device by Does not apply  route 2 (two) times daily.      Consultations:  Cardiology   Procedures/Studies:  DG Chest Portable 1 View  Result Date: 11/13/2020 CLINICAL DATA:  COPD exacerbation. EXAM: PORTABLE CHEST 1 VIEW COMPARISON:  11/07/2020 FINDINGS: Cardiomegaly. Aortic atherosclerosis. Biapical scarring. No acute confluent opacities or  effusions. Mild hyperinflation. No acute bony abnormality. IMPRESSION: Cardiomegaly, COPD.  Chronic changes.  No active disease. Electronically Signed   By: Charlett NoseKevin  Dover M.D.   On: 11/13/2020 21:23   DG Chest Portable 1 View  Result Date: 11/07/2020 CLINICAL DATA:  Shortness of breath EXAM: PORTABLE CHEST 1 VIEW COMPARISON:  08/31/2020 FINDINGS: Defibrillator pads overlying the mediastinum and left lower hemithorax. Lungs are essentially clear.  No pleural effusion or pneumothorax. The heart is normal in size. IMPRESSION: No evidence of acute cardiopulmonary disease. Electronically Signed   By: Charline BillsSriyesh  Krishnan M.D.   On: 11/07/2020 06:48   ECHOCARDIOGRAM LIMITED  Result Date: 11/09/2020    ECHOCARDIOGRAM LIMITED REPORT   Patient Name:   Dolores PattyDITH G [place]ont Date of Exam: 11/09/2020 Medical Rec #:  [id number]      Height:       64.0 in Accession #:    [id number]     Weight:       125.0 lb Date of Birth:  [birth year]      BSA:          1.602 m Patient Age:    87 years       BP:           103/60 mmHg Patient Gender: F              HR:           77 bpm. Exam Location:  [facility] Procedure: 2D Echo Indications:     Dyspnea R06.00  History:         Patient has prior history of Echocardiogram examinations, most                  recent 08/31/2020.  Sonographer:     Wonda Ceriseikeshia Stills Referring Phys:  [id number] CHRISTOPHER RONALD BERGE Diagnosing Phys: Julien Nordmannimothy Gollan MD IMPRESSIONS  1. Left ventricular ejection fraction, by estimation, is 55 %. The left ventricle has normal function. The left ventricle has no regional wall motion abnormalities.  2. Right ventricular systolic function is normal. The right ventricular size is mildly enlarged. There is moderately elevated pulmonary artery systolic pressure. The estimated right ventricular systolic pressure is 56.0 mmHg.  3. Left atrial size was mildly dilated.  4. Right atrial size was moderately dilated.  5. The mitral valve is normal in structure. Mild to moderate mitral valve  regurgitation  6. The inferior vena cava is dilated in size with <50% respiratory variability, suggesting right atrial pressure of 15 mmHg. FINDINGS  Left Ventricle: Left ventricular ejection fraction, by estimation, is 55 %. The left ventricle has normal function. The left ventricle has no regional wall motion abnormalities. The left ventricular internal cavity size was normal in size. There is no left ventricular hypertrophy. Right Ventricle: The right ventricular size is mildly enlarged. No increase in right ventricular wall thickness. Right ventricular systolic function is normal. There is moderately elevated pulmonary artery systolic pressure. The tricuspid regurgitant velocity is 3.20 m/s, and with an assumed right atrial pressure of 15 mmHg, the estimated right ventricular systolic pressure is 56.0 mmHg. Left Atrium: Left atrial size was mildly dilated. Right Atrium: Right atrial size was moderately dilated. Pericardium: There is no evidence  of pericardial effusion. Mitral Valve: The mitral valve is normal in structure. Mild to moderate mitral valve regurgitation. No evidence of mitral valve stenosis. Tricuspid Valve: The tricuspid valve is normal in structure. Tricuspid valve regurgitation is mild . No evidence of tricuspid stenosis. Aortic Valve: The aortic valve is normal in structure. Aortic valve regurgitation is not visualized. No aortic stenosis is present. Aortic valve peak gradient measures 5.0 mmHg. Pulmonic Valve: The pulmonic valve was normal in structure. Pulmonic valve regurgitation is not visualized. No evidence of pulmonic stenosis. Aorta: The aortic root is normal in size and structure. Venous: The inferior vena cava is dilated in size with less than 50% respiratory variability, suggesting right atrial pressure of 15 mmHg. IAS/Shunts: No atrial level shunt detected by color flow Doppler. LEFT VENTRICLE PLAX 2D LVIDd:         3.93 cm Diastology LVIDs:         2.64 cm LV e' medial:  5.00 cm/s  LV PW:         1.07 cm LV e' lateral: 6.96 cm/s LV IVS:        1.19 cm  LEFT ATRIUM         Index      RIGHT ATRIUM           Index LA diam:    4.70 cm 2.93 cm/m RA Area:     24.80 cm                                RA Volume:   82.70 ml  51.62 ml/m  AORTIC VALVE AV Vmax:      112.00 cm/s AV Peak Grad: 5.0 mmHg TRICUSPID VALVE TV Peak grad:   38.7 mmHg TV Vmax:        3.11 m/s TR Peak grad:   41.0 mmHg TR Vmax:        320.00 cm/s Julien Nordmann MD Electronically signed by Julien Nordmann MD Signature Date/Time: 11/09/2020/12:23:50 PM    Final       Subjective: - no chest pain, shortness of breath, no abdominal pain, nausea or vomiting.   Discharge Exam: BP 104/67 (BP Location: Right Arm)   Pulse (!) 54   Temp 98 F (36.7 C)   Resp 16   Ht 5\' 4"  (1.626 m)   Wt 65.6 kg   SpO2 96%   BMI 24.82 kg/m   General: Pt is alert, awake, not in acute distress Cardiovascular: RRR, S1/S2 +, no rubs, no gallops Respiratory: CTA bilaterally, no wheezing, no rhonchi Abdominal: Soft, NT, ND, bowel sounds + Extremities: no edema, no cyanosis   The results of significant diagnostics from this hospitalization (including imaging, microbiology, ancillary and laboratory) are listed below for reference.     Microbiology: Recent Results (from the past 240 hour(s))  Resp Panel by RT-PCR (Flu A&B, Covid) Nasopharyngeal Swab     Status: None   Collection Time: 11/13/20 10:17 PM   Specimen: Nasopharyngeal Swab; Nasopharyngeal(NP) swabs in vial transport medium  Result Value Ref Range Status   SARS Coronavirus 2 by RT PCR NEGATIVE NEGATIVE Final    Comment: (NOTE) SARS-CoV-2 target nucleic acids are NOT DETECTED.  The SARS-CoV-2 RNA is generally detectable in upper respiratory specimens during the acute phase of infection. The lowest concentration of SARS-CoV-2 viral copies this assay can detect is 138 copies/mL. A negative result does not preclude SARS-Cov-2 infection and should not be used as  the sole  basis for treatment or other patient management decisions. A negative result may occur with  improper specimen collection/handling, submission of specimen other than nasopharyngeal swab, presence of viral mutation(s) within the areas targeted by this assay, and inadequate number of viral copies(<138 copies/mL). A negative result must be combined with clinical observations, patient history, and epidemiological information. The expected result is Negative.  Fact Sheet for Patients:  BloggerCourse.com  Fact Sheet for Healthcare Providers:  SeriousBroker.it  This test is no t yet approved or cleared by the [place] FDA and  has been authorized for detection and/or diagnosis of SARS-CoV-2 by FDA under an Emergency Use Authorization (EUA). This EUA will remain  in effect (meaning this test can be used) for the duration of the COVID-19 declaration under Section 564(b)(1) of the Act, 21 U.S.C.section 360bbb-3(b)(1), unless the authorization is terminated  or revoked sooner.       Influenza A by PCR NEGATIVE NEGATIVE Final   Influenza B by PCR NEGATIVE NEGATIVE Final    Comment: (NOTE) The Xpert Xpress SARS-CoV-2/FLU/RSV plus assay is intended as an aid in the diagnosis of influenza from Nasopharyngeal swab specimens and should not be used as a sole basis for treatment. Nasal washings and aspirates are unacceptable for Xpert Xpress SARS-CoV-2/FLU/RSV testing.  Fact Sheet for Patients: BloggerCourse.com  Fact Sheet for Healthcare Providers: SeriousBroker.it  This test is not yet approved or cleared by the [place] FDA and has been authorized for detection and/or diagnosis of SARS-CoV-2 by FDA under an Emergency Use Authorization (EUA). This EUA will remain in effect (meaning this test can be used) for the duration of the COVID-19 declaration under Section 564(b)(1) of the Act,  21 U.S.C. section 360bbb-3(b)(1), unless the authorization is terminated or revoked.  Performed at [facility], [address] [place], [place] [address]      Labs: Basic Metabolic Panel: Recent Labs  Lab 11/13/20 2105 11/14/20 0709 11/15/20 1808 11/16/20 1415 11/17/20 0436  NA 137 137 137 133* 138  K 5.0 3.9 3.7 4.0 3.8  CL 96* 92* 91* 90* 95*  CO2 29 32 32 31 30  GLUCOSE 126* 166* 146* 152* 110*  BUN 34* 32* 53* 47* 41*  CREATININE 0.91 0.87 1.17* 1.01* 0.84  CALCIUM 9.1 8.7* 9.4 8.3* 8.7*   Liver Function Tests: Recent Labs  Lab 11/13/20 2105 11/16/20 1415 11/17/20 0436  AST 33 31 25  ALT 84* 49* 39  ALKPHOS 53 47 38  BILITOT 1.2 1.1 0.7  PROT 7.2 6.9 5.9*  ALBUMIN 4.0 3.6 3.1*   CBC: Recent Labs  Lab 11/13/20 2105 11/14/20 0546 11/15/20 1808 11/16/20 1415 11/17/20 0436  WBC 13.2* 9.9 18.8* 16.8* 11.4*  NEUTROABS 10.2*  --  17.0* 14.9*  --   HGB 16.9* 16.9* 17.2* 17.5* 17.1*  HCT 50.7* 51.2* 51.9* 53.1* 51.5*  MCV 94.1 94.6 93.3 92.8 92.5  PLT 260 237 275 265 239   CBG: No results for input(s): GLUCAP in the last 168 hours. Hgb A1c No results for input(s): HGBA1C in the last 72 hours. Lipid Profile No results for input(s): CHOL, HDL, LDLCALC, TRIG, CHOLHDL, LDLDIRECT in the last 72 hours. Thyroid function studies No results for input(s): TSH, T4TOTAL, T3FREE, THYROIDAB in the last 72 hours.  Invalid input(s): FREET3 Urinalysis    Component Value Date/Time   COLORURINE YELLOW 07/05/2020 1232   APPEARANCEUR CLOUDY (A) 07/05/2020 1232   LABSPEC 1.030 07/05/2020 1232   PHURINE 5.0 07/05/2020 1232   GLUCOSEU  NEGATIVE 07/05/2020 1232   HGBUR SMALL (A) 07/05/2020 1232   BILIRUBINUR NEGATIVE 07/05/2020 1232   KETONESUR NEGATIVE 07/05/2020 1232   PROTEINUR >300 (A) 07/05/2020 1232   NITRITE NEGATIVE 07/05/2020 1232   LEUKOCYTESUR SMALL (A) 07/05/2020 1232    FURTHER DISCHARGE INSTRUCTIONS:   Get Medicines reviewed and  adjusted: Please take all your medications with you for your next visit with your Primary MD   Laboratory/radiological data: Please request your Primary MD to go over all hospital tests and procedure/radiological results at the follow up, please ask your Primary MD to get all Hospital records sent to his/her office.   In some cases, they will be blood work, cultures and biopsy results pending at the time of your discharge. Please request that your primary care M.D. goes through all the records of your hospital data and follows up on these results.   Also Note the following: If you experience worsening of your admission symptoms, develop shortness of breath, life threatening emergency, suicidal or homicidal thoughts you must seek medical attention immediately by calling 911 or calling your MD immediately  if symptoms less severe.   You must read complete instructions/literature along with all the possible adverse reactions/side effects for all the Medicines you take and that have been prescribed to you. Take any new Medicines after you have completely understood and accpet all the possible adverse reactions/side effects.    Do not drive when taking Pain medications or sleeping medications (Benzodaizepines)   Do not take more than prescribed Pain, Sleep and Anxiety Medications. It is not advisable to combine anxiety,sleep and pain medications without talking with your primary care practitioner   Special Instructions: If you have smoked or chewed Tobacco  in the last 2 yrs please stop smoking, stop any regular Alcohol  and or any Recreational drug use.   Wear Seat belts while driving.   Please note: You were cared for by a hospitalist during your hospital stay. Once you are discharged, your primary care physician will handle any further medical issues. Please note that NO REFILLS for any discharge medications will be authorized once you are discharged, as it is imperative that you return to your  primary care physician (or establish a relationship with a primary care physician if you do not have one) for your post hospital discharge needs so that they can reassess your need for medications and monitor your lab values.  Time coordinating discharge: 40 minutes  SIGNED:  Pamella Pert, MD, PhD 11/18/2020, 9:08 AM

## 2020-11-23 ENCOUNTER — Ambulatory Visit: Payer: Medicare Other | Admitting: Family

## 2020-11-24 ENCOUNTER — Encounter: Payer: Self-pay | Admitting: Medical

## 2020-11-24 ENCOUNTER — Ambulatory Visit: Payer: Medicare Other | Admitting: Medical

## 2020-11-24 NOTE — Telephone Encounter (Signed)
Entered in error

## 2020-11-26 ENCOUNTER — Emergency Department: Payer: Medicare Other

## 2020-11-26 ENCOUNTER — Inpatient Hospital Stay
Admission: EM | Admit: 2020-11-26 | Discharge: 2020-11-26 | DRG: 062 | Disposition: A | Discharge status: Other Acute Inpatient Hospital | Payer: Medicare Other | Attending: Neurology | Admitting: Neurology

## 2020-11-26 ENCOUNTER — Inpatient Hospital Stay (HOSPITAL_COMMUNITY)
Admission: AD | Admit: 2020-11-26 | Discharge: 2020-12-02 | DRG: 065 | Disposition: A | Discharge status: Home Health | Payer: Medicare Other | Source: Other Acute Inpatient Hospital | Attending: Neurology | Admitting: Neurology

## 2020-11-26 ENCOUNTER — Other Ambulatory Visit: Payer: Self-pay

## 2020-11-26 DIAGNOSIS — I63532 Cerebral infarction due to unspecified occlusion or stenosis of left posterior cerebral artery: Secondary | ICD-10-CM | POA: Diagnosis present

## 2020-11-26 DIAGNOSIS — N183 Chronic kidney disease, stage 3 unspecified: Secondary | ICD-10-CM | POA: Diagnosis present

## 2020-11-26 DIAGNOSIS — J449 Chronic obstructive pulmonary disease, unspecified: Secondary | ICD-10-CM | POA: Diagnosis present

## 2020-11-26 DIAGNOSIS — Z66 Do not resuscitate: Secondary | ICD-10-CM | POA: Diagnosis present

## 2020-11-26 DIAGNOSIS — Z7901 Long term (current) use of anticoagulants: Secondary | ICD-10-CM

## 2020-11-26 DIAGNOSIS — R64 Cachexia: Secondary | ICD-10-CM | POA: Diagnosis present

## 2020-11-26 DIAGNOSIS — Z9049 Acquired absence of other specified parts of digestive tract: Secondary | ICD-10-CM

## 2020-11-26 DIAGNOSIS — I5032 Chronic diastolic (congestive) heart failure: Secondary | ICD-10-CM | POA: Diagnosis present

## 2020-11-26 DIAGNOSIS — I4821 Permanent atrial fibrillation: Secondary | ICD-10-CM | POA: Diagnosis present

## 2020-11-26 DIAGNOSIS — I130 Hypertensive heart and chronic kidney disease with heart failure and stage 1 through stage 4 chronic kidney disease, or unspecified chronic kidney disease: Secondary | ICD-10-CM | POA: Diagnosis present

## 2020-11-26 DIAGNOSIS — I10 Essential (primary) hypertension: Secondary | ICD-10-CM

## 2020-11-26 DIAGNOSIS — Z79899 Other long term (current) drug therapy: Secondary | ICD-10-CM | POA: Diagnosis not present

## 2020-11-26 DIAGNOSIS — R29704 NIHSS score 4: Secondary | ICD-10-CM | POA: Diagnosis not present

## 2020-11-26 DIAGNOSIS — Z20822 Contact with and (suspected) exposure to covid-19: Secondary | ICD-10-CM | POA: Diagnosis present

## 2020-11-26 DIAGNOSIS — I6522 Occlusion and stenosis of left carotid artery: Secondary | ICD-10-CM | POA: Diagnosis present

## 2020-11-26 DIAGNOSIS — F0390 Unspecified dementia without behavioral disturbance: Secondary | ICD-10-CM | POA: Diagnosis present

## 2020-11-26 DIAGNOSIS — I63443 Cerebral infarction due to embolism of bilateral cerebellar arteries: Principal | ICD-10-CM | POA: Diagnosis present

## 2020-11-26 DIAGNOSIS — I639 Cerebral infarction, unspecified: Secondary | ICD-10-CM | POA: Diagnosis not present

## 2020-11-26 DIAGNOSIS — R29705 NIHSS score 5: Secondary | ICD-10-CM | POA: Diagnosis not present

## 2020-11-26 DIAGNOSIS — Z87891 Personal history of nicotine dependence: Secondary | ICD-10-CM | POA: Diagnosis not present

## 2020-11-26 DIAGNOSIS — H53461 Homonymous bilateral field defects, right side: Secondary | ICD-10-CM | POA: Diagnosis present

## 2020-11-26 DIAGNOSIS — R29706 NIHSS score 6: Secondary | ICD-10-CM | POA: Diagnosis present

## 2020-11-26 DIAGNOSIS — I63433 Cerebral infarction due to embolism of bilateral posterior cerebral arteries: Secondary | ICD-10-CM | POA: Diagnosis not present

## 2020-11-26 DIAGNOSIS — Z9282 Status post administration of tPA (rtPA) in a different facility within the last 24 hours prior to admission to current facility: Secondary | ICD-10-CM | POA: Diagnosis not present

## 2020-11-26 DIAGNOSIS — Z7982 Long term (current) use of aspirin: Secondary | ICD-10-CM | POA: Diagnosis not present

## 2020-11-26 DIAGNOSIS — H5340 Unspecified visual field defects: Secondary | ICD-10-CM | POA: Diagnosis present

## 2020-11-26 DIAGNOSIS — Z9181 History of falling: Secondary | ICD-10-CM | POA: Diagnosis not present

## 2020-11-26 DIAGNOSIS — R4182 Altered mental status, unspecified: Secondary | ICD-10-CM | POA: Diagnosis not present

## 2020-11-26 DIAGNOSIS — I482 Chronic atrial fibrillation, unspecified: Secondary | ICD-10-CM | POA: Diagnosis not present

## 2020-11-26 DIAGNOSIS — E785 Hyperlipidemia, unspecified: Secondary | ICD-10-CM | POA: Diagnosis present

## 2020-11-26 DIAGNOSIS — Z6824 Body mass index (BMI) 24.0-24.9, adult: Secondary | ICD-10-CM

## 2020-11-26 DIAGNOSIS — H47619 Cortical blindness, unspecified side of brain: Secondary | ICD-10-CM | POA: Diagnosis present

## 2020-11-26 DIAGNOSIS — I6300 Cerebral infarction due to thrombosis of unspecified precerebral artery: Secondary | ICD-10-CM

## 2020-11-26 DIAGNOSIS — I6389 Other cerebral infarction: Secondary | ICD-10-CM | POA: Diagnosis not present

## 2020-11-26 DIAGNOSIS — R7303 Prediabetes: Secondary | ICD-10-CM | POA: Diagnosis present

## 2020-11-26 DIAGNOSIS — I6340 Cerebral infarction due to embolism of unspecified cerebral artery: Secondary | ICD-10-CM

## 2020-11-26 LAB — CBC
HCT: 52.9 % — ABNORMAL HIGH (ref 36.0–46.0)
Hemoglobin: 17.6 g/dL — ABNORMAL HIGH (ref 12.0–15.0)
MCH: 31.2 pg (ref 26.0–34.0)
MCHC: 33.3 g/dL (ref 30.0–36.0)
MCV: 93.6 fL (ref 80.0–100.0)
Platelets: 174 10*3/uL (ref 150–400)
RBC: 5.65 MIL/uL — ABNORMAL HIGH (ref 3.87–5.11)
RDW: 13.8 % (ref 11.5–15.5)
WBC: 13.5 10*3/uL — ABNORMAL HIGH (ref 4.0–10.5)
nRBC: 0 % (ref 0.0–0.2)

## 2020-11-26 LAB — COMPREHENSIVE METABOLIC PANEL
ALT: 33 U/L (ref 0–44)
AST: 31 U/L (ref 15–41)
Albumin: 3.8 g/dL (ref 3.5–5.0)
Alkaline Phosphatase: 47 U/L (ref 38–126)
Anion gap: 9 (ref 5–15)
BUN: 24 mg/dL — ABNORMAL HIGH (ref 8–23)
CO2: 34 mmol/L — ABNORMAL HIGH (ref 22–32)
Calcium: 9.1 mg/dL (ref 8.9–10.3)
Chloride: 95 mmol/L — ABNORMAL LOW (ref 98–111)
Creatinine, Ser: 0.88 mg/dL (ref 0.44–1.00)
GFR, Estimated: >60 mL/min (ref >60)
Glucose, Bld: 123 mg/dL — ABNORMAL HIGH (ref 70–99)
Potassium: 3.8 mmol/L (ref 3.5–5.1)
Sodium: 138 mmol/L (ref 135–145)
Total Bilirubin: 1.6 mg/dL — ABNORMAL HIGH (ref 0.3–1.2)
Total Protein: 6.9 g/dL (ref 6.5–8.1)

## 2020-11-26 LAB — DIFFERENTIAL
Abs Immature Granulocytes: 0.08 10*3/uL — ABNORMAL HIGH (ref 0.00–0.07)
Basophils Absolute: 0 10*3/uL (ref 0.0–0.1)
Basophils Relative: 0 %
Eosinophils Absolute: 0.1 10*3/uL (ref 0.0–0.5)
Eosinophils Relative: 1 %
Immature Granulocytes: 1 %
Lymphocytes Relative: 10 %
Lymphs Abs: 1.3 10*3/uL (ref 0.7–4.0)
Monocytes Absolute: 0.7 10*3/uL (ref 0.1–1.0)
Monocytes Relative: 5 %
Neutro Abs: 11.4 10*3/uL — ABNORMAL HIGH (ref 1.7–7.7)
Neutrophils Relative %: 83 %

## 2020-11-26 LAB — RESP PANEL BY RT-PCR (FLU A&B, COVID) ARPGX2
Influenza A by PCR: NEGATIVE
Influenza B by PCR: NEGATIVE
SARS Coronavirus 2 by RT PCR: NEGATIVE

## 2020-11-26 LAB — MRSA PCR SCREENING: MRSA by PCR: NEGATIVE

## 2020-11-26 LAB — APTT: aPTT: 25 seconds (ref 24–36)

## 2020-11-26 LAB — PROTIME-INR
INR: 1.1 (ref 0.8–1.2)
Prothrombin Time: 14.4 seconds (ref 11.4–15.2)

## 2020-11-26 MED ORDER — ACETAMINOPHEN 160 MG/5ML PO SOLN
650.0000 mg | ORAL | Status: DC | PRN
  Filled 2020-11-26: qty 20.3

## 2020-11-26 MED ORDER — STROKE: EARLY STAGES OF RECOVERY BOOK
Status: AC
  Administered 2020-11-26: 1
  Filled 2020-11-26: qty 1

## 2020-11-26 MED ORDER — SODIUM CHLORIDE 0.9 % IV SOLN: INTRAVENOUS | Status: DC

## 2020-11-26 MED ORDER — CHLORHEXIDINE GLUCONATE CLOTH 2 % EX PADS: 6.0000 | MEDICATED_PAD | Freq: Every day | CUTANEOUS | Status: DC

## 2020-11-26 MED ORDER — ALTEPLASE (STROKE) FULL DOSE INFUSION
0.9000 mg/kg | Freq: Once | INTRAVENOUS | Status: AC
  Filled 2020-11-26: qty 100

## 2020-11-26 MED ORDER — STROKE: EARLY STAGES OF RECOVERY BOOK: Freq: Once | Status: DC

## 2020-11-26 MED ORDER — CLEVIDIPINE BUTYRATE 0.5 MG/ML IV EMUL: 0.0000 mg/h | INTRAVENOUS | Status: DC

## 2020-11-26 MED ORDER — ORAL CARE MOUTH RINSE
15.0000 mL | Freq: Two times a day (BID) | OROMUCOSAL | Status: DC
  Administered 2020-11-27 – 2020-12-02 (×11): 15 mL via OROMUCOSAL

## 2020-11-26 MED ORDER — ACETAMINOPHEN 325 MG PO TABS: 650.0000 mg | ORAL_TABLET | ORAL | Status: DC | PRN

## 2020-11-26 MED ORDER — ACETAMINOPHEN 325 MG PO TABS
650.0000 mg | ORAL_TABLET | ORAL | Status: DC | PRN
Start: 2020-11-26 — End: 2020-11-26

## 2020-11-26 MED ORDER — STROKE: EARLY STAGES OF RECOVERY BOOK
Freq: Once | Status: AC
  Filled 2020-11-26: qty 1

## 2020-11-26 MED ORDER — PANTOPRAZOLE SODIUM 40 MG IV SOLR
40.0000 mg | Freq: Every day | INTRAVENOUS | Status: DC
  Administered 2020-11-26: 40 mg via INTRAVENOUS
  Filled 2020-11-26: qty 40

## 2020-11-26 MED ORDER — PANTOPRAZOLE SODIUM 40 MG IV SOLR: 40.0000 mg | Freq: Every day | INTRAVENOUS | Status: DC

## 2020-11-26 MED ORDER — SODIUM CHLORIDE 0.9% FLUSH
3.0000 mL | Freq: Once | INTRAVENOUS | Status: AC
  Administered 2020-11-26: 3 mL via INTRAVENOUS

## 2020-11-26 MED ORDER — ACETAMINOPHEN 650 MG RE SUPP: 650.0000 mg | RECTAL | Status: DC | PRN

## 2020-11-26 MED ORDER — LABETALOL HCL 5 MG/ML IV SOLN: 5.0000 mg | INTRAVENOUS | Status: DC | PRN

## 2020-11-26 MED ORDER — IOHEXOL 350 MG/ML SOLN
100.0000 mL | Freq: Once | INTRAVENOUS | Status: AC | PRN
  Administered 2020-11-26: 100 mL via INTRAVENOUS

## 2020-11-26 MED ORDER — ACETAMINOPHEN 160 MG/5ML PO SOLN: 650.0000 mg | ORAL | Status: DC | PRN

## 2020-11-26 MED ORDER — SENNOSIDES-DOCUSATE SODIUM 8.6-50 MG PO TABS: 1.0000 | ORAL_TABLET | Freq: Every evening | ORAL | Status: DC | PRN

## 2020-11-26 MED ORDER — SODIUM CHLORIDE 0.9 % IV SOLN
50.0000 mL | Freq: Once | INTRAVENOUS | Status: AC
  Administered 2020-11-26: 50 mL via INTRAVENOUS

## 2020-11-26 MED ORDER — ALTEPLASE 100 MG IV SOLR
INTRAVENOUS | Status: AC
  Administered 2020-11-26: 59 mg via INTRAVENOUS
  Filled 2020-11-26: qty 100

## 2020-11-26 NOTE — Consult Note (Signed)
Neurology Consultation Reason for Consult: Sudden onset blindness Requesting Physician: Reinaldo Meeker   CC: Vision difficulty   History is obtained from: Patient and daughter at bedside   HPI: Bridget [name] is a 87 y.o. female with a past medical history significant for atrial fibrillation not on anticoagulation secondary to perceived fall risk, heart failure with preserved EF, chronic kidney disease, mild dementia (baseline disoriented to month/age but ambulatory with walker and able to perform ADLs, make coffee for herself etc.)  She was recently hospitalized from 6/3-6/8 for acute decompensated heart failure and COPD exacerbation.  Daughter at bedside reports she has been doing very well since discharge other than some low energy for the past week which they were planning to treat conservatively with increased hydration.  She was last seen normal by family at 10:30 PM.  This morning they discovered her at 9:30 AM unable to see.  While the patient reports she was able to see when she initially woke and could see until about 9 AM, given her baseline disorientation to time she was not felt to be a reliable historian.  Advanced imaging was obtained which did demonstrate no large vessel occlusion however there was diffusion/FLAIR mismatch.  Given her baseline excellent functional status despite her memory impairment as well as the severity of her visual field deficit, in discussion with family the decision was made to treat with tPA, recognizing this is an off label use though supported by some clinical trial data she is at the edge of a treatment window and therefore relatively higher risk for complication.    LKW: 10:30 PM on 6/15 with symptom discovery at 9:30 AM tPA given?: Yes, 13:04 based on wake-up stroke criteria with discussion of off-label use with family at bedside Premorbid modified rankin scale:     1 - No significant disability. Able to carry out all usual activities, despite some  symptoms.  ROS: Unable to obtain due to baseline dementia, obtained from family as above   Past Medical History:  Diagnosis Date   (HFpEF) heart failure with preserved ejection fraction (HCC)    a. 08/2020 Echo: EF 60-65%, no rwma, Gr1 DD, nl RV fxn. Mildly dil RA. Mild MR/AI.   Asthma    CKD (chronic kidney disease), stage III (HCC)    COPD (chronic obstructive pulmonary disease) (HCC)    Falls    Hypertension    Mild Dementia (HCC)    Permanent atrial fibrillation (HCC)    a. CHA2DS2VASc = 5-->no OAC 2/2 unsteady gait/falls.   Unsteady gait    Past Surgical History:  Procedure Laterality Date   APPENDECTOMY     BREAST SURGERY     HERNIA REPAIR      Current Facility-Administered Medications:    alteplase (ACTIVASE) 1 mg/mL infusion 59 mg, 0.9 mg/kg, Intravenous, Once, Last Rate: 59 mL/hr at 11/26/20 1304, 59 mg at 11/26/20 1304 **FOLLOWED BY** 0.9 %  sodium chloride infusion, 50 mL, Intravenous, Once, Other Atienza L, MD  Current Outpatient Medications:    albuterol (VENTOLIN HFA) 108 (90 Base) MCG/ACT inhaler, Inhale 2 puffs into the lungs every 6 (six) hours as needed for wheezing or shortness of breath. (Patient not taking: No sig reported), Disp: 8 g, Rfl: 0   aspirin 81 MG chewable tablet, Chew 1 tablet (81 mg total) by mouth daily. (Patient not taking: No sig reported), Disp: 30 tablet, Rfl: 0   atorvastatin (LIPITOR) 10 MG tablet, Take 1 tablet (10 mg total) by mouth at bedtime., Disp:  30 tablet, Rfl: 0   bisoprolol (ZEBETA) 5 MG tablet, Take 1 tablet (5 mg total) by mouth daily., Disp: 30 tablet, Rfl: 0   furosemide (LASIX) 40 MG tablet, Take 1 tablet (40 mg total) by mouth daily., Disp: 30 tablet, Rfl: 1   ipratropium-albuterol (DUONEB) 0.5-2.5 (3) MG/3ML SOLN, Take 3 mLs by nebulization 4 (four) times daily., Disp: 360 mL, Rfl: 0   mometasone-formoterol (DULERA) 100-5 MCG/ACT AERO, Inhale 2 puffs into the lungs 2 (two) times daily. (Patient not taking: No sig reported),  Disp: 1 each, Rfl: 0   Spacer/Aero-Holding Chambers DEVI, 1 Device by Does not apply route 2 (two) times daily., Disp: 1 Device, Rfl: 0   Family History  Problem Relation Age of Onset   Immunocompromised Neg Hx     Social History:  reports that she has quit smoking. She has never used smokeless tobacco. She reports that she does not drink alcohol and does not use drugs.  Exam: Current vital signs: BP 112/90   Pulse 70   Temp 97.8 F (36.6 C) (Oral)   Resp 20   Ht 5\' 4"  (1.626 m)   Wt 65.6 kg   SpO2 100%   BMI 24.82 kg/m  Vital signs in last 24 hours: Temp:  [97.8 F (36.6 C)] 97.8 F (36.6 C) (06/16 1051) Pulse Rate:  [70-74] 70 (06/16 1130) Resp:  [16-20] 20 (06/16 1130) BP: (92-112)/(65-90) 112/90 (06/16 1130) SpO2:  [98 %-100 %] 100 % (06/16 1130) Weight:  [65.6 kg] 65.6 kg (06/16 1037)   Physical Exam  Constitutional: Appears well-developed and well-nourished.  Psych: Affect labile, sometimes irritable other times laughing slightly inappropriately Eyes: No scleral injection HENT: No oropharyngeal obstruction.  MSK: no joint deformities.  Cardiovascular: Irregularly irregular Respiratory: Effort normal, non-labored breathing, no grossly audible wheezing GI: Soft.  No distension. There is no tenderness.  Skin: Warm dry and intact visible skin  Neuro: Mental Status: Patient is awake, alert, oriented to person, but not age, month not completely to situation Patient is able to give very limited history No signs of aphasia or neglect Cranial Nerves: II: Visual Fields she appears unable to see at most times although intermittently she was able to count some fingers on the left side.  Does appear to have some light perception though it is not 100% reliable.  Left pupil is 6 to 4 mm, right pupil is 6 to 4.5 and appears to have a cataract, slightly more sluggish, though neither pupil is brisk. III,IV, VI: EOMI without ptosis or diploplia.  V: Facial sensation is  symmetric to temperature VII: Facial movement is symmetric.  VIII: hearing is intact to voice X: Uvula elevates symmetrically XI: Shoulder shrug is symmetric. XII: tongue is midline without atrophy or fasciculations.  Motor: Tone is normal. Bulk is normal.  No pronator drift.  Briskly lifts bilateral lower extremities antigravity for over 5 seconds Sensory: Sensation is symmetric to light touch in the arms and legs.  Denies any focal numbness Cerebellar: FNF and HKS are intact bilaterally  NIHSS total 5 (3 points for new symptoms) Score breakdown: 3 points for cortical blindness.  2 points for not knowing month or age (chronic)   I have reviewed labs in epic and the results pertinent to this consultation are: Creatinine 0.88 GFR greater than 60 CBC with hemoglobin of 17.6 (stable from baseline), mild leukocytosis to 13.5 (recent baseline 17-11.4), platelets 174 APTT/PT/INR within normal limits Glucose 129  Lab Results  Component Value Date  CHOL 144 11/07/2020   HDL 49 11/07/2020   LDLCALC 83 11/07/2020   TRIG 61 11/07/2020   CHOLHDL 2.9 11/07/2020   No results found for: HGBA1C   I have personally reviewed the images obtained: Head CT without clear acute intracranial process though sufficient chronic microvascular disease such that more acute changes may be masked CTA without clear large vessel occlusion though bilateral PCA perfusion appears limited distally CT perfusion showing tissue at risk without core infarct and in the bilateral supra lobes MRI brain with diffusion changes without clear associated FLAIR changes.  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   Discussed with family that the patient is very much in educators for treatment but given the severity of the stroke and her prior functional baseline, they felt that she would want the best possible chance of neurological recovery.    Recommendations: # Multifocal strokes resulting in near cortical blindness in the setting of atrial fibrillation not on anticoagulation, s/p tPA - Stroke labs HgbA1c, fasting lipid panel  -Consider increasing atorvastatin from home 10 mg to 20 mg if LDL still above 70 - MRI brain and MRA 24 hours post tPA - Frequent neuro checks - Echocardiogram not necessary in the setting of recent echo 3/30 and no new cardiac symptoms with known etiology of atrial fibrillation - No antiplatelets for 24 hours post tPA.  - Start DVT chemo prophylaxis for 24 hours if patient remains stable, SCDs for now - Risk factor modification - Telemetry monitoring - Blood pressure goal   - Post tPA for 24  hours < 180/105 - PT consult, OT consult, Speech consult, unless patient is back to baseline - Stroke team to follow  # Goals of care Discussed with family who reported that the patient would not want aggressive interventions in the setting of an intracerebral hemorrhage and is DNR CODE STATUS.  They may benefit from further discussion with palliative care team as despite her dementia they have deferred to her statements on her goals of care during recent hospitalizations and these have sometimes been inconsistent (thus she was briefly full code last hospitalization and then CPR but no intubation was discussed at her last hospitalization).  At this time they do feel that if the patient had her full faculties she would elect to be DNR/DNI  Brooke Dare MD-PhD [facility] [phone]  Total critical care time:70 minutes   Critical care time was exclusive of separately billable procedures and treating other patients.   Critical care was necessary to treat or prevent  imminent or life-threatening deterioration.   Critical care was time spent personally by me on the following activities: development of treatment plan with patient and/or surrogate as well as nursing, discussions with consultants/primary team, evaluation of patient's response to treatment, examination of patient, obtaining history from patient or surrogate, ordering and performing treatments and interventions, ordering and review of laboratory studies, ordering and review of radiographic studies, and re-evaluation of patient's condition as needed, as documented above.

## 2020-11-26 NOTE — ED Notes (Signed)
CODE  STROKE  CALLED T O  CARELINK    PER  DR  Erma Heritage  MD

## 2020-11-26 NOTE — ED Notes (Signed)
CARELINK  CALLED PER   DR  ISAACS  MD 

## 2020-11-26 NOTE — Progress Notes (Addendum)
OT Cancellation Note  Patient Details Name: Bridget [name] MRN: [id number] DOB: [birth year]   Cancelled Treatment:    Reason Eval/Treat Not Completed: Active bedrest order;Patient not medically ready. Consult received and chart reviewed.  Patient admitted to ICU for CVA work up with tPA infusion (ending at 1403, 11/26/20).  Per guidelines, to be on strict bedrest x24 hours post infusion and follow up imaging required to be completed prior to initiation of therapy.  Will continue to follow and initiate as medically appropriate.  Wynona Canes, MPH, MS, OTR/L ascom [phone] 11/26/20, 3:10 PM

## 2020-11-26 NOTE — ED Provider Notes (Signed)
[facility] Emergency Department Provider Note  ____________________________________________   Event Date/Time   First MD Initiated Contact with Patient 11/26/20 1105     (approximate)  I have reviewed the triage vital signs and the nursing notes.   HISTORY  Chief Complaint Altered Mental Status    HPI Bridget [name] is a 87 y.o. female here with altered mental status and visual changes.  The patient reportedly was last seen normal last night.  Patient lives with her daughter.  Patient woke up this morning, and she states she believes she felt well, though she does not necessarily remember.  When family came to check on her just prior to arrival, she could not see and was confused.  She was not looking at them when they were talking.  She was not making sense when talking.  No overt aphasia or slurred speech.  Since then, she has remained confused though slightly improved.  Her vision has remained severely decreased bilaterally, however.  This is all new for her.  She does have history of atrial fibrillation but is not on blood thinners due to history of falls.  On my assessment, she does admit that she cannot see.  Denies any focal numbness or weakness in her arms or legs.  Denies any difficulty speaking or swallowing.    Past Medical History:  Diagnosis Date   (HFpEF) heart failure with preserved ejection fraction (HCC)    a. 08/2020 Echo: EF 60-65%, no rwma, Gr1 DD, nl RV fxn. Mildly dil RA. Mild MR/AI.   Asthma    CKD (chronic kidney disease), stage III (HCC)    COPD (chronic obstructive pulmonary disease) (HCC)    Falls    Hypertension    Mild Dementia (HCC)    Permanent atrial fibrillation (HCC)    a. CHA2DS2VASc = 5-->no OAC 2/2 unsteady gait/falls.   Unsteady gait     Patient Active Problem List   Diagnosis Date Noted   Ischemic stroke (HCC) 11/26/2020   Acute on chronic diastolic (congestive) heart failure (HCC) 11/13/2020   CKD (chronic  kidney disease), stage II    Chronic diastolic CHF (congestive heart failure) (HCC)    Acute respiratory failure (HCC) 11/07/2020   Elevated troponin 11/07/2020   COPD with acute exacerbation (HCC) 08/31/2020   Atrial fibrillation, chronic (HCC) 08/31/2020   Acute CHF (congestive heart failure) (HCC) 08/31/2020   CKD (chronic kidney disease), stage III (HCC) 08/31/2020   Essential hypertension 07/06/2020   Acute lower UTI 07/06/2020   Acute respiratory disease due to COVID-19 virus 07/05/2020    Past Surgical History:  Procedure Laterality Date   APPENDECTOMY     BREAST SURGERY     HERNIA REPAIR      Prior to Admission medications   Medication Sig Start Date End Date Taking? Authorizing Provider  albuterol (VENTOLIN HFA) 108 (90 Base) MCG/ACT inhaler Inhale 2 puffs into the lungs every 6 (six) hours as needed for wheezing or shortness of breath. Patient not taking: No sig reported 11/10/20   Alford Highland, MD  aspirin 81 MG chewable tablet Chew 1 tablet (81 mg total) by mouth daily. Patient not taking: No sig reported 11/10/20   Alford Highland, MD  atorvastatin (LIPITOR) 10 MG tablet Take 1 tablet (10 mg total) by mouth at bedtime. 11/10/20   Alford Highland, MD  bisoprolol (ZEBETA) 5 MG tablet Take 1 tablet (5 mg total) by mouth daily. 11/10/20   Alford Highland, MD  furosemide (LASIX) 40 MG  tablet Take 1 tablet (40 mg total) by mouth daily. 11/19/20   Leatha Gilding, MD  ipratropium-albuterol (DUONEB) 0.5-2.5 (3) MG/3ML SOLN Take 3 mLs by nebulization 4 (four) times daily. 11/10/20   Alford Highland, MD  mometasone-formoterol (DULERA) 100-5 MCG/ACT AERO Inhale 2 puffs into the lungs 2 (two) times daily. Patient not taking: No sig reported 11/10/20   Alford Highland, MD  Spacer/Aero-Holding Chambers DEVI 1 Device by Does not apply route 2 (two) times daily. 11/10/20   Alford Highland, MD    Allergies Patient has no known allergies.  Family History  Problem Relation Age of  Onset   Immunocompromised Neg Hx     Social History Social History   Tobacco Use   Smoking status: Former    Pack years: 0.00   Smokeless tobacco: Never  Substance Use Topics   Alcohol use: Never   Drug use: Never    Review of Systems  Review of Systems  Constitutional:  Negative for fatigue and fever.  HENT:  Negative for congestion and sore throat.   Eyes:  Positive for visual disturbance.  Respiratory:  Negative for cough and shortness of breath.   Cardiovascular:  Negative for chest pain.  Gastrointestinal:  Negative for abdominal pain, diarrhea, nausea and vomiting.  Genitourinary:  Negative for flank pain.  Musculoskeletal:  Negative for back pain and neck pain.  Skin:  Negative for rash and wound.  Neurological:  Negative for weakness.  Psychiatric/Behavioral:  Positive for confusion.   All other systems reviewed and are negative.   ____________________________________________  PHYSICAL EXAM:      VITAL SIGNS: ED Triage Vitals  Enc Vitals Group     BP 11/26/20 1045 92/65     Pulse Rate 11/26/20 1045 74     Resp 11/26/20 1045 16     Temp 11/26/20 1051 97.8 F (36.6 C)     Temp Source 11/26/20 1051 Oral     SpO2 11/26/20 1045 98 %     Weight 11/26/20 1037 144 lb 10 oz (65.6 kg)     Height 11/26/20 1037  (1.626 m)     Head Circumference --      Peak Flow --      Pain Score --      Pain Loc --      Pain Edu? --      Excl. in GC? --      Physical Exam Vitals and nursing note reviewed.  Constitutional:      General: She is not in acute distress.    Appearance: She is well-developed.  HENT:     Head: Normocephalic and atraumatic.  Eyes:     Conjunctiva/sclera: Conjunctivae normal.  Cardiovascular:     Rate and Rhythm: Normal rate and regular rhythm.     Heart sounds: Normal heart sounds. No murmur heard.   No friction rub.  Pulmonary:     Effort: Pulmonary effort is normal. No respiratory distress.     Breath sounds: Normal breath sounds. No  wheezing or rales.  Abdominal:     General: There is no distension.     Palpations: Abdomen is soft.     Tenderness: There is no abdominal tenderness.  Musculoskeletal:     Cervical back: Neck supple.  Skin:    General: Skin is warm.     Capillary Refill: Capillary refill takes less than 2 seconds.  Neurological:     Mental Status: She is alert and oriented to person, place, and time.  Motor: No abnormal muscle tone.     Neurological Exam:  Mental Status: Alert and oriented to person, place, and time. Attention and concentration normal. Speech clear. Recent memory is intact. Cranial Nerves: Visual fields markedly diminished, with light/dark vision b/l eyes only. EOMI and PERRLA. No nystagmus noted. Facial sensation intact at forehead, maxillary cheek, and chin/mandible bilaterally. Subtle R facial droop/weakness. Hearing grossly normal. Uvula is midline, and palate elevates symmetrically. Normal SCM and trapezius strength. Tongue midline without fasciculations. Motor: Muscle strength 5/5 in proximal and distal UE and LE bilaterally. No pronator drift. Muscle tone normal.  Sensation: Intact to light touch in upper and lower extremities distally bilaterally.  Gait: Deferred Coordination: Unable to assess   ____________________________________________   LABS (all labs ordered are listed, but only abnormal results are displayed)  Labs Reviewed  CBC - Abnormal; Notable for the following components:      Result Value   WBC 13.5 (*)    RBC 5.65 (*)    Hemoglobin 17.6 (*)    HCT 52.9 (*)    All other components within normal limits  DIFFERENTIAL - Abnormal; Notable for the following components:   Neutro Abs 11.4 (*)    Abs Immature Granulocytes 0.08 (*)    All other components within normal limits  COMPREHENSIVE METABOLIC PANEL - Abnormal; Notable for the following components:   Chloride 95 (*)    CO2 34 (*)    Glucose, Bld 123 (*)    BUN 24 (*)    Total Bilirubin 1.6 (*)     All other components within normal limits  PROTIME-INR  APTT    ____________________________________________  EKG: Atrial fibrillation, ventricular rate 75.  QRS 101, QTc 457.  No acute ST elevations or depressions.  No acute events of acute ischemia or infarct. ________________________________________  RADIOLOGY All imaging, including plain films, CT scans, and ultrasounds, independently reviewed by me, and interpretations confirmed via formal radiology reads.  ED MD interpretation:   CT head: No acute intracranial abnormality CT angio: No large vessel occlusion MRI: Pending  Official radiology report(s): CT ANGIO HEAD NECK W WO CM  Result Date: 11/26/2020 CLINICAL DATA:  Bilateral vision changes EXAM: CT ANGIOGRAPHY HEAD AND NECK CT PERFUSION BRAIN TECHNIQUE: Multidetector CT imaging of the head and neck was performed using the standard protocol during bolus administration of intravenous contrast. Multiplanar CT image reconstructions and MIPs were obtained to evaluate the vascular anatomy. Carotid stenosis measurements (when applicable) are obtained utilizing NASCET criteria, using the distal internal carotid diameter as the denominator. Multiphase CT imaging of the brain was performed following IV bolus contrast injection. Subsequent parametric perfusion maps were calculated using RAPID software. CONTRAST:  OMNIPAQUE IOHEXOL 350 MG/ML SOLN COMPARISON:  None. FINDINGS: CTA NECK Poor contrast bolus timing with greater contrast in the pulmonary arteries. Aortic arch: Mixed plaque along the aortic arch and patent great vessel origins. There is no high-grade stenosis of the proximal subclavian arteries. Right carotid system: Patent. Calcified plaque at the bifurcation and proximal internal carotid causing less than 50% stenosis. Left carotid system: Patent. Calcified plaque at the bifurcation and proximal internal carotid causing less than 50% stenosis. Vertebral arteries: Patent and  codominant. Plaque is present at the left vertebral origin without high-grade stenosis. Skeleton: Degenerative changes of the cervical spine. Other neck: Unremarkable Upper chest: Scarring at the lung apices. Review of the MIP images confirms the above findings CTA HEAD Poor contrast bolus timing particularly limiting more distal evaluation. Anterior circulation: Intracranial internal carotid  arteries are patent with calcified plaque causing up to mild to moderate stenosis at the level of the clinoids. Anterior and middle cerebral arteries are patent. Posterior circulation: Intracranial vertebral arteries are patent with calcified plaque causing mild stenosis on the right. Basilar artery is patent. Bilateral PICA origins are patent. Patent left AICA origin noted. Superior cerebellar artery origins are patent. Bilateral P1 and P2 PCA segments are patent. More distal evaluation is limited. Venous sinuses: Patent as allowed by contrast bolus timing. Review of the MIP images confirms the above findings CT Brain Perfusion Findings: CBF (<30%) Volume: 0mL Perfusion (Tmax>6.0s) volume: 55mL Mismatch Volume: 55mL Infarction Location: Bilateral occipital lobes with some parietal involvement. IMPRESSION: Suboptimal valuation due to poor contrast bolus timing. No large vessel occlusion. Specifically, bilateral P1 and P2 PCA segments are patent. Perfusion imaging demonstrates no evidence of core infarction and 55 mL penumbra involving the bilateral occipital lobes with some parietal involvement. Again, bolus is suboptimal but the area of involvement fits with reported symptoms. Plaque at the ICA origins causes less than 50% stenosis. These results were communicated to Dr. Liliane ChannelBhagath at 12:28 pm on 11/26/2020 by text page via the [facility] messaging system. Electronically Signed   By: Guadlupe SpanishPraneil  Patel M.D.   On: 11/26/2020 12:53   CT HEAD WO CONTRAST  Result Date: 11/26/2020 CLINICAL DATA:  Altered mental status, visual loss EXAM: CT  HEAD WITHOUT CONTRAST TECHNIQUE: Contiguous axial images were obtained from the base of the skull through the vertex without intravenous contrast. COMPARISON:  None. FINDINGS: Brain: No evidence of acute infarction, hemorrhage, hydrocephalus, extra-axial collection or mass lesion/mass effect. Mild low-density changes within the periventricular and subcortical white matter compatible with chronic microvascular ischemic change. Mild diffuse cerebral volume loss. Vascular: Atherosclerotic calcifications involving the large vessels of the skull base. No unexpected hyperdense vessel. Skull: Normal. Negative for fracture or focal lesion. Sinuses/Orbits: Mucosal thickening with air-fluid levels involving the bilateral maxillary sinuses, sphenoid sinuses, and ethmoid air cells. Mastoid air cells are clear. Orbital structures unremarkable. Other: None. IMPRESSION: 1. No acute intracranial findings by CT. 2. Mild chronic microvascular ischemic change and cerebral volume loss. 3. Paranasal sinus disease with air-fluid levels. Correlate for signs and symptoms of acute sinusitis. Electronically Signed   By: Duanne GuessNicholas  Plundo D.O.   On: 11/26/2020 11:34   MR BRAIN WO CONTRAST  Result Date: 11/26/2020 CLINICAL DATA:  Bilateral abnormal vision, abnormal CTP EXAM: MRI HEAD WITHOUT CONTRAST TECHNIQUE: Multiplanar, multiecho pulse sequences of the brain and surrounding structures were obtained without intravenous contrast tailored for acute stroke evaluation. COMPARISON:  None. FINDINGS: Diffusion, SWI, and axial T2 FLAIR sequences were obtained. There is cortical/subcortical reduced diffusion and bilateral occipital lobes, left greater than right. There is also involvement of the posteromedial right temporal lobe. Bilateral small foci of reduced diffusion are present in the cerebellum. A punctate focus of reduced diffusion is present in the anterior left frontal white matter. No significant mass effect. No definite hemorrhage.  Curvilinear susceptibility in a left occipital sulcus could reflect thrombus. IMPRESSION: Acute posterior circulation infarcts involving bilateral occipital lobes, right posteromedial temporal lobe, and cerebellum. Punctate acute infarct of the left frontal white matter. Electronically Signed   By: Guadlupe SpanishPraneil  Patel M.D.   On: 11/26/2020 12:59   CT CEREBRAL PERFUSION W CONTRAST  Result Date: 11/26/2020 CLINICAL DATA:  Bilateral vision changes EXAM: CT ANGIOGRAPHY HEAD AND NECK CT PERFUSION BRAIN TECHNIQUE: Multidetector CT imaging of the head and neck was performed using the standard protocol during bolus  administration of intravenous contrast. Multiplanar CT image reconstructions and MIPs were obtained to evaluate the vascular anatomy. Carotid stenosis measurements (when applicable) are obtained utilizing NASCET criteria, using the distal internal carotid diameter as the denominator. Multiphase CT imaging of the brain was performed following IV bolus contrast injection. Subsequent parametric perfusion maps were calculated using RAPID software. CONTRAST:  OMNIPAQUE IOHEXOL 350 MG/ML SOLN COMPARISON:  None. FINDINGS: CTA NECK Poor contrast bolus timing with greater contrast in the pulmonary arteries. Aortic arch: Mixed plaque along the aortic arch and patent great vessel origins. There is no high-grade stenosis of the proximal subclavian arteries. Right carotid system: Patent. Calcified plaque at the bifurcation and proximal internal carotid causing less than 50% stenosis. Left carotid system: Patent. Calcified plaque at the bifurcation and proximal internal carotid causing less than 50% stenosis. Vertebral arteries: Patent and codominant. Plaque is present at the left vertebral origin without high-grade stenosis. Skeleton: Degenerative changes of the cervical spine. Other neck: Unremarkable Upper chest: Scarring at the lung apices. Review of the MIP images confirms the above findings CTA HEAD Poor contrast  bolus timing particularly limiting more distal evaluation. Anterior circulation: Intracranial internal carotid arteries are patent with calcified plaque causing up to mild to moderate stenosis at the level of the clinoids. Anterior and middle cerebral arteries are patent. Posterior circulation: Intracranial vertebral arteries are patent with calcified plaque causing mild stenosis on the right. Basilar artery is patent. Bilateral PICA origins are patent. Patent left AICA origin noted. Superior cerebellar artery origins are patent. Bilateral P1 and P2 PCA segments are patent. More distal evaluation is limited. Venous sinuses: Patent as allowed by contrast bolus timing. Review of the MIP images confirms the above findings CT Brain Perfusion Findings: CBF (<30%) Volume: 43mL Perfusion (Tmax>6.0s) volume: 22mL Mismatch Volume: 65mL Infarction Location: Bilateral occipital lobes with some parietal involvement. IMPRESSION: Suboptimal valuation due to poor contrast bolus timing. No large vessel occlusion. Specifically, bilateral P1 and P2 PCA segments are patent. Perfusion imaging demonstrates no evidence of core infarction and 55 mL penumbra involving the bilateral occipital lobes with some parietal involvement. Again, bolus is suboptimal but the area of involvement fits with reported symptoms. Plaque at the ICA origins causes less than 50% stenosis. These results were communicated to Dr. Liliane Channel at 12:28 pm on 11/26/2020 by text page via the [facility] messaging system. Electronically Signed   By: Guadlupe Spanish M.D.   On: 11/26/2020 12:53    ____________________________________________  PROCEDURES   Procedure(s) performed (including Critical Care):  .Critical Care  Date/Time: 11/26/2020 3:22 PM Performed by: Shaune Pollack, MD Authorized by: Shaune Pollack, MD   Critical care provider statement:    Critical care time (minutes):  55   Critical care time was exclusive of:  Separately billable procedures and  treating other patients and teaching time   Critical care was necessary to treat or prevent imminent or life-threatening deterioration of the following conditions:  Cardiac failure, circulatory failure, respiratory failure and CNS failure or compromise   Critical care was time spent personally by me on the following activities:  Development of treatment plan with patient or surrogate, discussions with consultants, evaluation of patient's response to treatment, examination of patient, obtaining history from patient or surrogate, ordering and performing treatments and interventions, ordering and review of laboratory studies, ordering and review of radiographic studies, pulse oximetry, re-evaluation of patient's condition and review of old charts   I assumed direction of critical care for this patient from another provider in my  specialty: no    ____________________________________________  INITIAL IMPRESSION / MDM / ASSESSMENT AND PLAN / ED COURSE  As part of my medical decision making, I reviewed the following data within the electronic MEDICAL RECORD NUMBER Nursing notes reviewed and incorporated, Old chart reviewed, Notes from prior ED visits, and [place] Controlled Substance Database       *CHENNEL OLIVOS was evaluated in Emergency Department on 11/26/2020 for the symptoms described in the history of present illness. She was evaluated in the context of the global COVID-19 pandemic, which necessitated consideration that the patient might be at risk for infection with the SARS-CoV-2 virus that causes COVID-19. Institutional protocols and algorithms that pertain to the evaluation of patients at risk for COVID-19 are in a state of rapid change based on information released by regulatory bodies including the CDC and federal and state organizations. These policies and algorithms were followed during the patient's care in the ED.  Some ED evaluations and interventions may be delayed as a result of limited staffing  during the pandemic.*     Medical Decision Making: 87 year old female here with loss of vision and altered mental status.  By strict criteria, patient does not meet code stroke criteria given that last known normal was last night, and she has no focal weakness to suggest LVO.  That being said, it is somewhat unclear whether she was at her baseline when she woke up this morning.  Given this as well as the significance of her deficits and good functional baseline, I asked neurology to immediately evaluate.  They recommend CT angio and perfusion.  This was obtained, reviewed, and does show possible perfusion deficit.  Neurology recommended activating as a code stroke and had a long, detailed discussion with the patient and family regarding the risks and benefits of tPA.  Family states that it is likely that patient could see when she woke up as she turned her coffee machine on, and it was still reportedly warm when they checked on her, despite it having only a 2-hour timer.  That being said, given the risks and benefits, they would elect for tPA treatment.  See neurology discussion for full details of consent.  Patient given tPA, seems to be tolerating well.  Blood pressure at goal.  No other contraindications.  Lab work is otherwise overall reassuring and unremarkable.  Patient will be admitted to the ICU.  Initially admitted to the ICU at [facility], but no beds available.  Accepted by Dr. Otelia Limes to [facility].  ____________________________________________  FINAL CLINICAL IMPRESSION(S) / ED DIAGNOSES  Final diagnoses:  Cerebrovascular accident (CVA) due to embolism of cerebral artery (HCC)     MEDICATIONS GIVEN DURING THIS VISIT:  Medications   stroke: mapping our early stages of recovery book (0 each Does not apply Hold 11/26/20 1409)  acetaminophen (TYLENOL) tablet 650 mg (has no administration in time range)    Or  acetaminophen (TYLENOL) 160 MG/5ML solution 650 mg (has no administration in time  range)    Or  acetaminophen (TYLENOL) suppository 650 mg (has no administration in time range)  senna-docusate (Senokot-S) tablet 1 tablet (has no administration in time range)  pantoprazole (PROTONIX) injection 40 mg (has no administration in time range)  labetalol (NORMODYNE) injection 5 mg (has no administration in time range)  sodium chloride flush (NS) 0.9 % injection 3 mL (3 mLs Intravenous Given 11/26/20 1151)  iohexol (OMNIPAQUE) 350 MG/ML injection 100 mL (100 mLs Intravenous Contrast Given 11/26/20 1226)  alteplase (ACTIVASE) 1  mg/mL infusion 59 mg (0 mg/kg  65.6 kg Intravenous Stopped 11/26/20 1403)    Followed by  0.9 %  sodium chloride infusion (0 mLs Intravenous Stopped 11/26/20 1416)     ED Discharge Orders     None        Note:  This document was prepared using Dragon voice recognition software and may include unintentional dictation errors.   Shaune Pollack, MD 11/26/20 [phone]

## 2020-11-26 NOTE — ED Triage Notes (Signed)
Per pt daughter, pt woke up around 0945, states the pt is mental status has changed, pt is some loss of vision, states everything is darker, states she came see light and shapes.

## 2020-11-26 NOTE — H&P (Signed)
Admission H&P    Chief Complaint: Sudden onset of blindness  HPI: Bridget [name] is an 87 y.o. female with atrial fibrillation, not on anticoagulation, who presents in transfer from [facility] after receiving tPA there for acute onset of cortical blindness suspected to be secondary to stroke occurring overnight during sleep. Wake up stroke protocol was used to determine eligibility for IV tPA, which was administered. She was transferred to [facility] due to a lack of ICU beds at [facility].   Per Dr. Rollene Fare consult note from today: "Bridget [name] is a 87 y.o. female with a past medical history significant for atrial fibrillation not on anticoagulation secondary to perceived fall risk, heart failure with preserved EF, chronic kidney disease, mild dementia (baseline disoriented to month/age but ambulatory with walker and able to perform ADLs, make coffee for herself etc.). She was recently hospitalized from 6/3-6/8 for acute decompensated heart failure and COPD exacerbation.  Daughter at bedside reports she has been doing very well since discharge other than some low energy for the past week which they were planning to treat conservatively with increased hydration.  She was last seen normal by family at 10:30 PM.  This morning they discovered her at 9:30 AM unable to see.  While the patient reports she was able to see when she initially woke and could see until about 9 AM, given her baseline disorientation to time she was not felt to be a reliable historian. Advanced imaging was obtained which did demonstrate no large vessel occlusion however there was diffusion/FLAIR mismatch.  Given her baseline excellent functional status despite her memory impairment as well as the severity of her visual field deficit, in discussion with family the decision was made to treat with tPA, recognizing this is an off label use though supported by some clinical trial data she is at the edge of a treatment window and therefore relatively higher  risk for complication."  LKW: 10:30 PM on 6/15 with symptom discovery at 9:30 AM tPA given?: Yes, 13:04 based on wake-up stroke criteria with discussion of off-label use with family at bedside mRS: 1       Past Medical History:  Diagnosis Date   (HFpEF) heart failure with preserved ejection fraction (HCC)    a. 08/2020 Echo: EF 60-65%, no rwma, Gr1 DD, nl RV fxn. Mildly dil RA. Mild MR/AI.   Asthma    CKD (chronic kidney disease), stage III (HCC)    COPD (chronic obstructive pulmonary disease) (HCC)    Falls    Hypertension    Mild Dementia (HCC)    Permanent atrial fibrillation (HCC)    a. CHA2DS2VASc = 5-->no OAC 2/2 unsteady gait/falls.   Unsteady gait     Past Surgical History:  Procedure Laterality Date   APPENDECTOMY     BREAST SURGERY     HERNIA REPAIR      Family History  Problem Relation Age of Onset   Immunocompromised Neg Hx    Social History:  reports that she has quit smoking. She has never used smokeless tobacco. She reports that she does not drink alcohol and does not use drugs.  Allergies: No Known Allergies  Medications Prior to Admission  Medication Sig Dispense Refill   aspirin 81 MG chewable tablet Chew 1 tablet (81 mg total) by mouth daily. 30 tablet 0   aspirin EC 81 MG tablet Take 81 mg by mouth daily. Swallow whole.     atorvastatin (LIPITOR) 10 MG tablet Take 1 tablet (10 mg total)  by mouth at bedtime. 30 tablet 0   bisoprolol (ZEBETA) 5 MG tablet Take 1 tablet (5 mg total) by mouth daily. 30 tablet 0   furosemide (LASIX) 40 MG tablet Take 1 tablet (40 mg total) by mouth daily. 30 tablet 1   ipratropium-albuterol (DUONEB) 0.5-2.5 (3) MG/3ML SOLN Take 3 mLs by nebulization 4 (four) times daily. (Patient taking differently: Take 3 mLs by nebulization every 6 (six) hours as needed (sob/wheezing).) 360 mL 0   albuterol (VENTOLIN HFA) 108 (90 Base) MCG/ACT inhaler Inhale 2 puffs into the lungs every 6 (six) hours as needed for wheezing or shortness of  breath. (Patient not taking: Reported on 11/26/2020) 8 g 0   mometasone-formoterol (DULERA) 100-5 MCG/ACT AERO Inhale 2 puffs into the lungs 2 (two) times daily. (Patient not taking: No sig reported) 1 each 0   Spacer/Aero-Holding Chambers DEVI 1 Device by Does not apply route 2 (two) times daily. 1 Device 0    ROS: Does not currently endorse any complaints  Physical Examination: Blood pressure 134/71, pulse 74, temperature 97.9 F (36.6 C), temperature source Oral, resp. rate 19, height 5\' 4"  (1.626 m), weight 58.8 kg, SpO2 92 %.  HEENT-  [place]/AT  Lungs - Respirations unlabored. No gross rhonchi or wheezing noted.  Abdomen - Nondistanded Extremities - No edema  Neurologic Examination: Mental Status: Patient is awake, alert, oriented to person, but not to the year, day, month, city or state. She is pleasant and cooperative with bright affect. Speech is fluent with intact comprehension of basic motor commands, but with difficulty following more complex commands. No dysarthria. Naming is impaired. No signs of aphasia or neglect Cranial Nerves: II: Visual impairment with inability to identify most objects, inability to consistently count fingers, inconsistent fixation on objects presented to her visually and visual field constriction that waxes and wanes. Pattern is most consistent with cortical blindness. Left pupil is 6 to 4 mm, right pupil is 6 to 4.5 and appears to have a cataract, slightly more sluggish, though neither pupil is brisk. III,IV, VI: EOMI without ptosis or diplopia. V: Facial sensation is symmetric to temperature VII: Smile is symmetric. VIII: hearing is intact to voice X: No hypophonia XI: Shoulder shrug is symmetric. XII: Tongue is midline without atrophy or fasciculations. Motor: Tone is normal. Bulk is normal.  No pronator drift.  Briskly lifts bilateral lower extremities antigravity for over 5 seconds. Resists examiner with 4+/5 strength in upper and lower extremities.   Sensory: Sensation is symmetric to light touch in the arms and legs.  Denies any focal numbness Cerebellar: FNF and HKS are intact bilaterally    Results for orders placed or performed during the hospital encounter of 11/26/20 (from the past 48 hour(s))  Protime-INR     Status: None   Collection Time: 11/26/20 10:50 AM  Result Value Ref Range   Prothrombin Time 14.4 11.4 - 15.2 seconds   INR 1.1 0.8 - 1.2    Comment: (NOTE) INR goal varies based on device and disease states. Performed at [facility], [address] [place], [place]   APTT     Status: None   Collection Time: 11/26/20 10:50 AM  Result Value Ref Range   aPTT 25 24 - 36 seconds    Comment: Performed at [facility], [address] [place], [place]  CBC     Status: Abnormal   Collection Time: 11/26/20 10:50 AM  Result Value Ref Range   WBC 13.5 (H)  4.0 - 10.5 K/uL   RBC 5.65 (H) 3.87 - 5.11 MIL/uL   Hemoglobin 17.6 (H) 12.0 - 15.0 g/dL   HCT 16.1 (H) 09.6 - 04.5 %   MCV 93.6 80.0 - 100.0 fL   MCH 31.2 26.0 - 34.0 pg   MCHC 33.3 30.0 - 36.0 g/dL   RDW 40.9 81.1 - 91.4 %   Platelets 174 150 - 400 K/uL   nRBC 0.0 0.0 - 0.2 %    Comment: Performed at [facility], [address] [place], [place] [address]  Differential     Status: Abnormal   Collection Time: 11/26/20 10:50 AM  Result Value Ref Range   Neutrophils Relative % 83 %   Neutro Abs 11.4 (H) 1.7 - 7.7 K/uL   Lymphocytes Relative 10 %   Lymphs Abs 1.3 0.7 - 4.0 K/uL   Monocytes Relative 5 %   Monocytes Absolute 0.7 0.1 - 1.0 K/uL   Eosinophils Relative 1 %   Eosinophils Absolute 0.1 0.0 - 0.5 K/uL   Basophils Relative 0 %   Basophils Absolute 0.0 0.0 - 0.1 K/uL   Immature Granulocytes 1 %   Abs Immature Granulocytes 0.08 (H) 0.00 - 0.07 K/uL    Comment: Performed at [facility], [address] [place], [place] [address]  Comprehensive metabolic panel     Status: Abnormal    Collection Time: 11/26/20 10:50 AM  Result Value Ref Range   Sodium 138 135 - 145 mmol/L   Potassium 3.8 3.5 - 5.1 mmol/L   Chloride 95 (L) 98 - 111 mmol/L   CO2 34 (H) 22 - 32 mmol/L   Glucose, Bld 123 (H) 70 - 99 mg/dL    Comment: Glucose reference range applies only to samples taken after fasting for at least 8 hours.   BUN 24 (H) 8 - 23 mg/dL   Creatinine, Ser 8.65 0.44 - 1.00 mg/dL   Calcium 9.1 8.9 - 78.4 mg/dL   Total Protein 6.9 6.5 - 8.1 g/dL   Albumin 3.8 3.5 - 5.0 g/dL   AST 31 15 - 41 U/L   ALT 33 0 - 44 U/L   Alkaline Phosphatase 47 38 - 126 U/L   Total Bilirubin 1.6 (H) 0.3 - 1.2 mg/dL   GFR, Estimated >69 >62 mL/min    Comment: (NOTE) Calculated using the CKD-EPI Creatinine Equation (2021)    Anion gap 9 5 - 15    Comment: Performed at [facility], [address] [place], [place] [address]  Resp Panel by RT-PCR (Flu A&B, Covid) Nasopharyngeal Swab     Status: None   Collection Time: 11/26/20  3:51 PM   Specimen: Nasopharyngeal Swab; Nasopharyngeal(NP) swabs in vial transport medium  Result Value Ref Range   SARS Coronavirus 2 by RT PCR NEGATIVE NEGATIVE    Comment: (NOTE) SARS-CoV-2 target nucleic acids are NOT DETECTED.  The SARS-CoV-2 RNA is generally detectable in upper respiratory specimens during the acute phase of infection. The lowest concentration of SARS-CoV-2 viral copies this assay can detect is 138 copies/mL. A negative result does not preclude SARS-Cov-2 infection and should not be used as the sole basis for treatment or other patient management decisions. A negative result may occur with  improper specimen collection/handling, submission of specimen other than nasopharyngeal swab, presence of viral mutation(s) within the areas targeted by this assay, and inadequate number of viral copies(<138 copies/mL). A negative result must be combined with clinical observations, patient history, and epidemiological information. The  expected result  is Negative.  Fact Sheet for Patients:  BloggerCourse.com  Fact Sheet for Healthcare Providers:  SeriousBroker.it  This test is no t yet approved or cleared by the [place] FDA and  has been authorized for detection and/or diagnosis of SARS-CoV-2 by FDA under an Emergency Use Authorization (EUA). This EUA will remain  in effect (meaning this test can be used) for the duration of the COVID-19 declaration under Section 564(b)(1) of the Act, 21 U.S.C.section 360bbb-3(b)(1), unless the authorization is terminated  or revoked sooner.       Influenza A by PCR NEGATIVE NEGATIVE   Influenza B by PCR NEGATIVE NEGATIVE    Comment: (NOTE) The Xpert Xpress SARS-CoV-2/FLU/RSV plus assay is intended as an aid in the diagnosis of influenza from Nasopharyngeal swab specimens and should not be used as a sole basis for treatment. Nasal washings and aspirates are unacceptable for Xpert Xpress SARS-CoV-2/FLU/RSV testing.  Fact Sheet for Patients: BloggerCourse.com  Fact Sheet for Healthcare Providers: SeriousBroker.it  This test is not yet approved or cleared by the [place] FDA and has been authorized for detection and/or diagnosis of SARS-CoV-2 by FDA under an Emergency Use Authorization (EUA). This EUA will remain in effect (meaning this test can be used) for the duration of the COVID-19 declaration under Section 564(b)(1) of the Act, 21 U.S.C. section 360bbb-3(b)(1), unless the authorization is terminated or revoked.  Performed at [facility], [address] [place], [place] [address]    CT ANGIO HEAD NECK W WO CM  Result Date: 11/26/2020 CLINICAL DATA:  Bilateral vision changes EXAM: CT ANGIOGRAPHY HEAD AND NECK CT PERFUSION BRAIN TECHNIQUE: Multidetector CT imaging of the head and neck was performed using the standard protocol during bolus administration of  intravenous contrast. Multiplanar CT image reconstructions and MIPs were obtained to evaluate the vascular anatomy. Carotid stenosis measurements (when applicable) are obtained utilizing NASCET criteria, using the distal internal carotid diameter as the denominator. Multiphase CT imaging of the brain was performed following IV bolus contrast injection. Subsequent parametric perfusion maps were calculated using RAPID software. CONTRAST:  OMNIPAQUE IOHEXOL 350 MG/ML SOLN COMPARISON:  None. FINDINGS: CTA NECK Poor contrast bolus timing with greater contrast in the pulmonary arteries. Aortic arch: Mixed plaque along the aortic arch and patent great vessel origins. There is no high-grade stenosis of the proximal subclavian arteries. Right carotid system: Patent. Calcified plaque at the bifurcation and proximal internal carotid causing less than 50% stenosis. Left carotid system: Patent. Calcified plaque at the bifurcation and proximal internal carotid causing less than 50% stenosis. Vertebral arteries: Patent and codominant. Plaque is present at the left vertebral origin without high-grade stenosis. Skeleton: Degenerative changes of the cervical spine. Other neck: Unremarkable Upper chest: Scarring at the lung apices. Review of the MIP images confirms the above findings CTA HEAD Poor contrast bolus timing particularly limiting more distal evaluation. Anterior circulation: Intracranial internal carotid arteries are patent with calcified plaque causing up to mild to moderate stenosis at the level of the clinoids. Anterior and middle cerebral arteries are patent. Posterior circulation: Intracranial vertebral arteries are patent with calcified plaque causing mild stenosis on the right. Basilar artery is patent. Bilateral PICA origins are patent. Patent left AICA origin noted. Superior cerebellar artery origins are patent. Bilateral P1 and P2 PCA segments are patent. More distal evaluation is limited. Venous sinuses:  Patent as allowed by contrast bolus timing. Review of the MIP images confirms the above findings CT Brain Perfusion Findings: CBF (<30%)  Volume: 0mL Perfusion (Tmax>6.0s) volume: 55mL Mismatch Volume: 55mL Infarction Location: Bilateral occipital lobes with some parietal involvement. IMPRESSION: Suboptimal valuation due to poor contrast bolus timing. No large vessel occlusion. Specifically, bilateral P1 and P2 PCA segments are patent. Perfusion imaging demonstrates no evidence of core infarction and 55 mL penumbra involving the bilateral occipital lobes with some parietal involvement. Again, bolus is suboptimal but the area of involvement fits with reported symptoms. Plaque at the ICA origins causes less than 50% stenosis. These results were communicated to Dr. Liliane ChannelBhagath at 12:28 pm on 11/26/2020 by text page via the [facility] messaging system. Electronically Signed   By: Guadlupe SpanishPraneil  Patel M.D.   On: 11/26/2020 12:53   CT HEAD WO CONTRAST  Result Date: 11/26/2020 CLINICAL DATA:  Altered mental status, visual loss EXAM: CT HEAD WITHOUT CONTRAST TECHNIQUE: Contiguous axial images were obtained from the base of the skull through the vertex without intravenous contrast. COMPARISON:  None. FINDINGS: Brain: No evidence of acute infarction, hemorrhage, hydrocephalus, extra-axial collection or mass lesion/mass effect. Mild low-density changes within the periventricular and subcortical white matter compatible with chronic microvascular ischemic change. Mild diffuse cerebral volume loss. Vascular: Atherosclerotic calcifications involving the large vessels of the skull base. No unexpected hyperdense vessel. Skull: Normal. Negative for fracture or focal lesion. Sinuses/Orbits: Mucosal thickening with air-fluid levels involving the bilateral maxillary sinuses, sphenoid sinuses, and ethmoid air cells. Mastoid air cells are clear. Orbital structures unremarkable. Other: None. IMPRESSION: 1. No acute intracranial findings by CT. 2. Mild  chronic microvascular ischemic change and cerebral volume loss. 3. Paranasal sinus disease with air-fluid levels. Correlate for signs and symptoms of acute sinusitis. Electronically Signed   By: Duanne GuessNicholas  Plundo D.O.   On: 11/26/2020 11:34   MR BRAIN WO CONTRAST  Result Date: 11/26/2020 CLINICAL DATA:  Bilateral abnormal vision, abnormal CTP EXAM: MRI HEAD WITHOUT CONTRAST TECHNIQUE: Multiplanar, multiecho pulse sequences of the brain and surrounding structures were obtained without intravenous contrast tailored for acute stroke evaluation. COMPARISON:  None. FINDINGS: Diffusion, SWI, and axial T2 FLAIR sequences were obtained. There is cortical/subcortical reduced diffusion and bilateral occipital lobes, left greater than right. There is also involvement of the posteromedial right temporal lobe. Bilateral small foci of reduced diffusion are present in the cerebellum. A punctate focus of reduced diffusion is present in the anterior left frontal white matter. No significant mass effect. No definite hemorrhage. Curvilinear susceptibility in a left occipital sulcus could reflect thrombus. IMPRESSION: Acute posterior circulation infarcts involving bilateral occipital lobes, right posteromedial temporal lobe, and cerebellum. Punctate acute infarct of the left frontal white matter. Electronically Signed   By: Guadlupe SpanishPraneil  Patel M.D.   On: 11/26/2020 12:59   CT CEREBRAL PERFUSION W CONTRAST  Result Date: 11/26/2020 CLINICAL DATA:  Bilateral vision changes EXAM: CT ANGIOGRAPHY HEAD AND NECK CT PERFUSION BRAIN TECHNIQUE: Multidetector CT imaging of the head and neck was performed using the standard protocol during bolus administration of intravenous contrast. Multiplanar CT image reconstructions and MIPs were obtained to evaluate the vascular anatomy. Carotid stenosis measurements (when applicable) are obtained utilizing NASCET criteria, using the distal internal carotid diameter as the denominator. Multiphase CT imaging  of the brain was performed following IV bolus contrast injection. Subsequent parametric perfusion maps were calculated using RAPID software. CONTRAST:  100mL OMNIPAQUE IOHEXOL 350 MG/ML SOLN COMPARISON:  None. FINDINGS: CTA NECK Poor contrast bolus timing with greater contrast in the pulmonary arteries. Aortic arch: Mixed plaque along the aortic arch and patent great vessel origins. There is no  high-grade stenosis of the proximal subclavian arteries. Right carotid system: Patent. Calcified plaque at the bifurcation and proximal internal carotid causing less than 50% stenosis. Left carotid system: Patent. Calcified plaque at the bifurcation and proximal internal carotid causing less than 50% stenosis. Vertebral arteries: Patent and codominant. Plaque is present at the left vertebral origin without high-grade stenosis. Skeleton: Degenerative changes of the cervical spine. Other neck: Unremarkable Upper chest: Scarring at the lung apices. Review of the MIP images confirms the above findings CTA HEAD Poor contrast bolus timing particularly limiting more distal evaluation. Anterior circulation: Intracranial internal carotid arteries are patent with calcified plaque causing up to mild to moderate stenosis at the level of the clinoids. Anterior and middle cerebral arteries are patent. Posterior circulation: Intracranial vertebral arteries are patent with calcified plaque causing mild stenosis on the right. Basilar artery is patent. Bilateral PICA origins are patent. Patent left AICA origin noted. Superior cerebellar artery origins are patent. Bilateral P1 and P2 PCA segments are patent. More distal evaluation is limited. Venous sinuses: Patent as allowed by contrast bolus timing. Review of the MIP images confirms the above findings CT Brain Perfusion Findings: CBF (<30%) Volume: 0mL Perfusion (Tmax>6.0s) volume: 55mL Mismatch Volume: 55mL Infarction Location: Bilateral occipital lobes with some parietal involvement.  IMPRESSION: Suboptimal valuation due to poor contrast bolus timing. No large vessel occlusion. Specifically, bilateral P1 and P2 PCA segments are patent. Perfusion imaging demonstrates no evidence of core infarction and 55 mL penumbra involving the bilateral occipital lobes with some parietal involvement. Again, bolus is suboptimal but the area of involvement fits with reported symptoms. Plaque at the ICA origins causes less than 50% stenosis. These results were communicated to Dr. Liliane Channel at 12:28 pm on 11/26/2020 by text page via the [facility] messaging system. Electronically Signed   By: Guadlupe Spanish M.D.   On: 11/26/2020 12:53    Assessment: 87 y.o. female with atrial fibrillation, not on anticoagulation, who presents in transfer from [facility] after receiving tPA there for acute onset of cortical blindness suspected to be secondary to stroke occurring overnight during sleep. Wake up stroke protocol was used to determine eligibility for IV tPA, which was administered. She was transferred to [facility] due to a lack of ICU beds at [facility].  1. Exam findings are most consistent with cortical blindness 2. CTA of head and neck with CTP: Suboptimal evaluation due to poor contrast bolus timing. No large vessel occlusion. Specifically, bilateral P1 and P2 PCA segments are patent. Perfusion imaging demonstrates no evidence of core infarction and 55 mL penumbra involving the bilateral occipital lobes with some parietal involvement. Again, bolus is suboptimal but the area of involvement fits with reported symptoms. Plaque at the ICA origins causes less than 50% stenosis.  3. MRI brain: Acute posterior circulation infarcts involving bilateral occipital lobes, right posteromedial temporal lobe, and cerebellum. Punctate acute infarct of the left frontal white matter 4.Stroke Risk Factors - CHF, CKD, HTN and permanent atrial fibrillation  Plan: 1. Admitting to Neuro ICU.  2. Post-tPA order set to include frequent neuro checks and BP  management.  3. No antiplatelet medications or anticoagulants for at least 24 hours following tPA.  4. DVT prophylaxis with SCDs.  5. Cardiac telemetry.  6. TTE 7. PT/OT/Speech. Special attention to her cortical blindness.  8. NPO until passes swallow evaluation.  9. Sliding scale insulin.  10. Fasting lipid panel, HgbA1c   Electronically signed: Dr. Caryl Pina 11/26/2020, 5:45 PM

## 2020-11-26 NOTE — Consult Note (Signed)
CODE STROKE- PHARMACY COMMUNICATION   Time CODE STROKE called/page received:1234  Time response to CODE STROKE was made (in person): 1238  Time Stroke Kit retrieved from [place] (only if needed):1240 - infusion @1304 ; 5.49m bolus over 157m; then 53.56m98mnf over 1hr - Waste 456m78mame of Provider/Nurse contacted:Dr. Srishti Bhagat [employer] Past Medical History:  Diagnosis Date   (HFpEF) heart failure with preserved ejection fraction (HCC)[place] a. 08/2020 Echo: EF 60-65%, no rwma, Gr1 DD, nl RV fxn. Mildly dil RA. Mild MR/AI.   Asthma    CKD (chronic kidney disease), stage III (HCC)    COPD (chronic obstructive pulmonary disease) (HCC)    Falls    Hypertension    Mild Dementia (HCC)    Permanent atrial fibrillation (HCC)    a. CHA2DS2VASc = 5-->no OAC 2/2 unsteady gait/falls.   Unsteady gait    Prior to Admission medications   Medication Sig Start Date End Date Taking? Authorizing Provider  albuterol (VENTOLIN HFA) 108 (90 Base) MCG/ACT inhaler Inhale 2 puffs into the lungs every 6 (six) hours as needed for wheezing or shortness of breath. Patient not taking: No sig reported 11/10/20   WietLoletha Grayer  aspirin 81 MG chewable tablet Chew 1 tablet (81 mg total) by mouth daily. Patient not taking: No sig reported 11/10/20   WietLoletha Grayer  atorvastatin (LIPITOR) 10 MG tablet Take 1 tablet (10 mg total) by mouth at bedtime. 11/10/20   WietLoletha Grayer  bisoprolol (ZEBETA) 5 MG tablet Take 1 tablet (5 mg total) by mouth daily. 11/10/20   WietLoletha Grayer  furosemide (LASIX) 40 MG tablet Take 1 tablet (40 mg total) by mouth daily. 11/19/20   GherCaren Griffins  ipratropium-albuterol (DUONEB) 0.5-2.5 (3) MG/3ML SOLN Take 3 mLs by nebulization 4 (four) times daily. 11/10/20   WietLoletha Grayer  mometasone-formoterol (DULERA) 100-5 MCG/ACT AERO Inhale 2 puffs into the lungs 2 (two) times daily. Patient not taking: No sig reported 11/10/20   WietLoletha Grayer   Spacer/Aero-Holding Chambers DEVI 1 Device by Does not apply route 2 (two) times daily. 11/10/20   WietLoletha Grayer    BranLorna DibblearmD Clinical Pharmacist  11/26/2020  2:42 PM

## 2020-11-27 ENCOUNTER — Encounter (HOSPITAL_COMMUNITY): Payer: Self-pay | Admitting: Neurology

## 2020-11-27 ENCOUNTER — Inpatient Hospital Stay (HOSPITAL_COMMUNITY): Payer: Medicare Other

## 2020-11-27 DIAGNOSIS — I482 Chronic atrial fibrillation, unspecified: Secondary | ICD-10-CM

## 2020-11-27 DIAGNOSIS — I63433 Cerebral infarction due to embolism of bilateral posterior cerebral arteries: Secondary | ICD-10-CM

## 2020-11-27 LAB — LIPID PANEL
Cholesterol: 120 mg/dL (ref 0–200)
HDL: 39 mg/dL — ABNORMAL LOW (ref >40)
LDL Cholesterol: 68 mg/dL (ref 0–99)
Total CHOL/HDL Ratio: 3.1 RATIO
Triglycerides: 64 mg/dL (ref <150)
VLDL: 13 mg/dL (ref 0–40)

## 2020-11-27 LAB — GLUCOSE, CAPILLARY: Glucose-Capillary: 118 mg/dL — ABNORMAL HIGH (ref 70–99)

## 2020-11-27 MED ORDER — IPRATROPIUM-ALBUTEROL 0.5-2.5 (3) MG/3ML IN SOLN: 3.0000 mL | Freq: Four times a day (QID) | RESPIRATORY_TRACT | Status: DC | PRN

## 2020-11-27 MED ORDER — ASPIRIN EC 81 MG PO TBEC
81.0000 mg | DELAYED_RELEASE_TABLET | Freq: Every day | ORAL | Status: DC
  Administered 2020-11-27 – 2020-11-30 (×4): 81 mg via ORAL
  Filled 2020-11-27 (×4): qty 1

## 2020-11-27 MED ORDER — WHITE PETROLATUM EX OINT
TOPICAL_OINTMENT | CUTANEOUS | Status: AC
  Filled 2020-11-27: qty 28.35

## 2020-11-27 MED ORDER — ATORVASTATIN CALCIUM 10 MG PO TABS
10.0000 mg | ORAL_TABLET | Freq: Every day | ORAL | Status: DC
  Administered 2020-11-27 – 2020-12-01 (×5): 10 mg via ORAL
  Filled 2020-11-27 (×5): qty 1

## 2020-11-27 MED ORDER — BISOPROLOL FUMARATE 5 MG PO TABS
5.0000 mg | ORAL_TABLET | Freq: Every day | ORAL | Status: DC
  Administered 2020-11-27 – 2020-12-02 (×6): 5 mg via ORAL
  Filled 2020-11-27 (×6): qty 1

## 2020-11-27 MED ORDER — FUROSEMIDE 40 MG PO TABS
40.0000 mg | ORAL_TABLET | Freq: Every day | ORAL | Status: DC
  Administered 2020-11-27 – 2020-12-02 (×6): 40 mg via ORAL
  Filled 2020-11-27 (×6): qty 1

## 2020-11-27 NOTE — Progress Notes (Signed)
Upon the first bladder scan  of 295 pt stated she did not feel like she had to urinate but would try, Purewick in place and placed pt on bed pan in hopes she would be able to pass urine. Pt stated she was unable and "please don't make me". Second bladder scan showing 686. Pt stated she did feel like she had to urinate this AM, pt attempted but was only able to pass a small amount. Pt requesting to not force her to pee any more and not to give her a catheter. Dayshift RN aware upon shift change.

## 2020-11-27 NOTE — Evaluation (Signed)
Clinical/Bedside Swallow Evaluation Patient Details  Name: MARRION FINAN MRN: [id number] Date of Birth: [birth year]  Today's Date: 11/27/2020 Time: SLP Start Time (ACUTE ONLY): 1130 SLP Stop Time (ACUTE ONLY): 1140 SLP Time Calculation (min) (ACUTE ONLY): 10 min  Past Medical History:  Past Medical History:  Diagnosis Date   (HFpEF) heart failure with preserved ejection fraction (HCC)    a. 08/2020 Echo: EF 60-65%, no rwma, Gr1 DD, nl RV fxn. Mildly dil RA. Mild MR/AI.   Asthma    CKD (chronic kidney disease), stage III (HCC)    COPD (chronic obstructive pulmonary disease) (HCC)    Falls    Hypertension    Mild Dementia (HCC)    Permanent atrial fibrillation (HCC)    a. CHA2DS2VASc = 5-->no OAC 2/2 unsteady gait/falls.   Unsteady gait    Past Surgical History:  Past Surgical History:  Procedure Laterality Date   APPENDECTOMY     BREAST SURGERY     HERNIA REPAIR     HPI:  GWYNDOLYN GUILFORD is an 87 y.o. female with atrial fibrillation, mild dementia, COPD from [facility] after receiving tPA there for acute onset of cortical blindness suspected to be secondary to stroke occurring overnight during sleep. Per chart at baseline daughter reports she is not oriented to time/age but ambulatory with walker and able to perform ADLs (make coffee for herself etc.) MRI acute posterior circulation infarcts involving bilateral occipital  lobes, right posteromedial temporal lobe, and cerebellum. Punctate acute infarct of the left frontal white matter   Assessment / Plan / Recommendation Clinical Impression  Pt passed [facility] with RN then coughing with pills. Proceeded with BSE given postive for stroke with a baseline dementia. Her natural dentition are in good condition and oral motor abilities adequate to masticate regular textures without impairments. No indications of reduced airway protection with straw sips thin sequentially. Vocal quality is normal and volitional cough stronag. Recommend continue with  regular, thin and place pills whole in puree.No further ST needed. SLP Visit Diagnosis: Dysphagia, unspecified (R13.10)    Aspiration Risk  Mild aspiration risk    Diet Recommendation Regular;Thin liquid   Liquid Administration via: Cup;Straw Medication Administration: Whole meds with puree Supervision: Patient able to self feed Compensations: Minimize environmental distractions;Slow rate;Small sips/bites Postural Changes: Seated upright at 90 degrees    Other  Recommendations Oral Care Recommendations: Oral care BID   Follow up Recommendations 24 hour supervision/assistance      Frequency and Duration            Prognosis        Swallow Study   General Date of Onset: 11/26/20 HPI: ISELA STANTZ is an 87 y.o. female with atrial fibrillation, mild dementia, COPD from [facility] after receiving tPA there for acute onset of cortical blindness suspected to be secondary to stroke occurring overnight during sleep. Per chart at baseline daughter reports she is not oriented to time/age but ambulatory with walker and able to perform ADLs (make coffee for herself etc.) MRI acute posterior circulation infarcts involving bilateral occipital  lobes, right posteromedial temporal lobe, and cerebellum. Punctate acute infarct of the left frontal white matter Type of Study: Bedside Swallow Evaluation Previous Swallow Assessment:  (none) Diet Prior to this Study: Regular;Thin liquids Temperature Spikes Noted: No Respiratory Status: Room air History of Recent Intubation: No Behavior/Cognition: Alert;Cooperative;Pleasant mood;Confused;Requires cueing Oral Cavity Assessment: Within Functional Limits Oral Care Completed by SLP: No Oral Cavity - Dentition: Adequate natural dentition Vision: Functional for self-feeding Self-Feeding  Abilities: Needs set up Patient Positioning: Upright in bed Baseline Vocal Quality: Normal Volitional Cough: Strong Volitional Swallow: Able to elicit     Oral/Motor/Sensory Function Overall Oral Motor/Sensory Function: Within functional limits   Ice Chips Ice chips: Not tested   Thin Liquid Thin Liquid: Within functional limits Presentation: Cup;Straw    Nectar Thick Nectar Thick Liquid: Not tested   Honey Thick Honey Thick Liquid: Not tested   Puree Puree: Within functional limits   Solid     Solid: Within functional limits      Royce Macadamia 11/27/2020,12:19 PM

## 2020-11-27 NOTE — Progress Notes (Addendum)
STROKE TEAM PROGRESS NOTE   SUBJECTIVE (INTERVAL HISTORY) Her RN is at the bedside.  Overall her condition is gradually improving. Pt can not give coherent history but she did say that she could not see yesterday and she can see more today. When I asked her what happened yesterday, she said "you know I don't remember things". Pending repeat MRI.    OBJECTIVE Temp:  [97.8 F (36.6 C)-99.3 F (37.4 C)] 97.9 F (36.6 C) (06/17 0400) Pulse Rate:  [63-166] 78 (06/17 0900) Cardiac Rhythm: Atrial fibrillation (06/17 0400) Resp:  [16-24] 18 (06/17 0900) BP: (92-163)/(58-94) 121/63 (06/17 0900) SpO2:  [85 %-100 %] 100 % (06/17 0900) Weight:  [58.8 kg-65.6 kg] 58.8 kg (06/16 1645)  No results for input(s): GLUCAP in the last 168 hours. Recent Labs  Lab 11/26/20 1050  NA 138  K 3.8  CL 95*  CO2 34*  GLUCOSE 123*  BUN 24*  CREATININE 0.88  CALCIUM 9.1   Recent Labs  Lab 11/26/20 1050  AST 31  ALT 33  ALKPHOS 47  BILITOT 1.6*  PROT 6.9  ALBUMIN 3.8   Recent Labs  Lab 11/26/20 1050  WBC 13.5*  NEUTROABS 11.4*  HGB 17.6*  HCT 52.9*  MCV 93.6  PLT 174   No results for input(s): CKTOTAL, CKMB, CKMBINDEX, TROPONINI in the last 168 hours. Recent Labs    11/26/20 1050  LABPROT 14.4  INR 1.1   No results for input(s): COLORURINE, LABSPEC, PHURINE, GLUCOSEU, HGBUR, BILIRUBINUR, KETONESUR, PROTEINUR, UROBILINOGEN, NITRITE, LEUKOCYTESUR in the last 72 hours.  Invalid input(s): APPERANCEUR     Component Value Date/Time   CHOL 120 11/27/2020 0436   TRIG 64 11/27/2020 0436   HDL 39 (L) 11/27/2020 0436   CHOLHDL 3.1 11/27/2020 0436   VLDL 13 11/27/2020 0436   LDLCALC 68 11/27/2020 0436   No results found for: HGBA1C No results found for: LABOPIA, COCAINSCRNUR, LABBENZ, AMPHETMU, THCU, LABBARB  No results for input(s): ETH in the last 168 hours.  I have personally reviewed the radiological images below and agree with the radiology interpretations.  CT ANGIO HEAD NECK W  WO CM  Result Date: 11/26/2020 CLINICAL DATA:  Bilateral vision changes EXAM: CT ANGIOGRAPHY HEAD AND NECK CT PERFUSION BRAIN TECHNIQUE: Multidetector CT imaging of the head and neck was performed using the standard protocol during bolus administration of intravenous contrast. Multiplanar CT image reconstructions and MIPs were obtained to evaluate the vascular anatomy. Carotid stenosis measurements (when applicable) are obtained utilizing NASCET criteria, using the distal internal carotid diameter as the denominator. Multiphase CT imaging of the brain was performed following IV bolus contrast injection. Subsequent parametric perfusion maps were calculated using RAPID software. CONTRAST:  OMNIPAQUE IOHEXOL 350 MG/ML SOLN COMPARISON:  None. FINDINGS: CTA NECK Poor contrast bolus timing with greater contrast in the pulmonary arteries. Aortic arch: Mixed plaque along the aortic arch and patent great vessel origins. There is no high-grade stenosis of the proximal subclavian arteries. Right carotid system: Patent. Calcified plaque at the bifurcation and proximal internal carotid causing less than 50% stenosis. Left carotid system: Patent. Calcified plaque at the bifurcation and proximal internal carotid causing less than 50% stenosis. Vertebral arteries: Patent and codominant. Plaque is present at the left vertebral origin without high-grade stenosis. Skeleton: Degenerative changes of the cervical spine. Other neck: Unremarkable Upper chest: Scarring at the lung apices. Review of the MIP images confirms the above findings CTA HEAD Poor contrast bolus timing particularly limiting more distal evaluation. Anterior circulation: Intracranial  internal carotid arteries are patent with calcified plaque causing up to mild to moderate stenosis at the level of the clinoids. Anterior and middle cerebral arteries are patent. Posterior circulation: Intracranial vertebral arteries are patent with calcified plaque causing mild  stenosis on the right. Basilar artery is patent. Bilateral PICA origins are patent. Patent left AICA origin noted. Superior cerebellar artery origins are patent. Bilateral P1 and P2 PCA segments are patent. More distal evaluation is limited. Venous sinuses: Patent as allowed by contrast bolus timing. Review of the MIP images confirms the above findings CT Brain Perfusion Findings: CBF (<30%) Volume: 61mL Perfusion (Tmax>6.0s) volume: 53mL Mismatch Volume: 68mL Infarction Location: Bilateral occipital lobes with some parietal involvement. IMPRESSION: Suboptimal valuation due to poor contrast bolus timing. No large vessel occlusion. Specifically, bilateral P1 and P2 PCA segments are patent. Perfusion imaging demonstrates no evidence of core infarction and 55 mL penumbra involving the bilateral occipital lobes with some parietal involvement. Again, bolus is suboptimal but the area of involvement fits with reported symptoms. Plaque at the ICA origins causes less than 50% stenosis. These results were communicated to Dr. Liliane Channel at 12:28 pm on 11/26/2020 by text page via the [facility] messaging system. Electronically Signed   By: Guadlupe Spanish M.D.   On: 11/26/2020 12:53   CT HEAD WO CONTRAST  Result Date: 11/26/2020 CLINICAL DATA:  Altered mental status, visual loss EXAM: CT HEAD WITHOUT CONTRAST TECHNIQUE: Contiguous axial images were obtained from the base of the skull through the vertex without intravenous contrast. COMPARISON:  None. FINDINGS: Brain: No evidence of acute infarction, hemorrhage, hydrocephalus, extra-axial collection or mass lesion/mass effect. Mild low-density changes within the periventricular and subcortical white matter compatible with chronic microvascular ischemic change. Mild diffuse cerebral volume loss. Vascular: Atherosclerotic calcifications involving the large vessels of the skull base. No unexpected hyperdense vessel. Skull: Normal. Negative for fracture or focal lesion. Sinuses/Orbits:  Mucosal thickening with air-fluid levels involving the bilateral maxillary sinuses, sphenoid sinuses, and ethmoid air cells. Mastoid air cells are clear. Orbital structures unremarkable. Other: None. IMPRESSION: 1. No acute intracranial findings by CT. 2. Mild chronic microvascular ischemic change and cerebral volume loss. 3. Paranasal sinus disease with air-fluid levels. Correlate for signs and symptoms of acute sinusitis. Electronically Signed   By: Duanne Guess D.O.   On: 11/26/2020 11:34   MR BRAIN WO CONTRAST  Result Date: 11/26/2020 CLINICAL DATA:  Bilateral abnormal vision, abnormal CTP EXAM: MRI HEAD WITHOUT CONTRAST TECHNIQUE: Multiplanar, multiecho pulse sequences of the brain and surrounding structures were obtained without intravenous contrast tailored for acute stroke evaluation. COMPARISON:  None. FINDINGS: Diffusion, SWI, and axial T2 FLAIR sequences were obtained. There is cortical/subcortical reduced diffusion and bilateral occipital lobes, left greater than right. There is also involvement of the posteromedial right temporal lobe. Bilateral small foci of reduced diffusion are present in the cerebellum. A punctate focus of reduced diffusion is present in the anterior left frontal white matter. No significant mass effect. No definite hemorrhage. Curvilinear susceptibility in a left occipital sulcus could reflect thrombus. IMPRESSION: Acute posterior circulation infarcts involving bilateral occipital lobes, right posteromedial temporal lobe, and cerebellum. Punctate acute infarct of the left frontal white matter. Electronically Signed   By: Guadlupe Spanish M.D.   On: 11/26/2020 12:59   CT CEREBRAL PERFUSION W CONTRAST  Result Date: 11/26/2020 CLINICAL DATA:  Bilateral vision changes EXAM: CT ANGIOGRAPHY HEAD AND NECK CT PERFUSION BRAIN TECHNIQUE: Multidetector CT imaging of the head and neck was performed using the standard protocol  during bolus administration of intravenous contrast.  Multiplanar CT image reconstructions and MIPs were obtained to evaluate the vascular anatomy. Carotid stenosis measurements (when applicable) are obtained utilizing NASCET criteria, using the distal internal carotid diameter as the denominator. Multiphase CT imaging of the brain was performed following IV bolus contrast injection. Subsequent parametric perfusion maps were calculated using RAPID software. CONTRAST:  OMNIPAQUE IOHEXOL 350 MG/ML SOLN COMPARISON:  None. FINDINGS: CTA NECK Poor contrast bolus timing with greater contrast in the pulmonary arteries. Aortic arch: Mixed plaque along the aortic arch and patent great vessel origins. There is no high-grade stenosis of the proximal subclavian arteries. Right carotid system: Patent. Calcified plaque at the bifurcation and proximal internal carotid causing less than 50% stenosis. Left carotid system: Patent. Calcified plaque at the bifurcation and proximal internal carotid causing less than 50% stenosis. Vertebral arteries: Patent and codominant. Plaque is present at the left vertebral origin without high-grade stenosis. Skeleton: Degenerative changes of the cervical spine. Other neck: Unremarkable Upper chest: Scarring at the lung apices. Review of the MIP images confirms the above findings CTA HEAD Poor contrast bolus timing particularly limiting more distal evaluation. Anterior circulation: Intracranial internal carotid arteries are patent with calcified plaque causing up to mild to moderate stenosis at the level of the clinoids. Anterior and middle cerebral arteries are patent. Posterior circulation: Intracranial vertebral arteries are patent with calcified plaque causing mild stenosis on the right. Basilar artery is patent. Bilateral PICA origins are patent. Patent left AICA origin noted. Superior cerebellar artery origins are patent. Bilateral P1 and P2 PCA segments are patent. More distal evaluation is limited. Venous sinuses: Patent as allowed by  contrast bolus timing. Review of the MIP images confirms the above findings CT Brain Perfusion Findings: CBF (<30%) Volume: 0mL Perfusion (Tmax>6.0s) volume: 55mL Mismatch Volume: 55mL Infarction Location: Bilateral occipital lobes with some parietal involvement. IMPRESSION: Suboptimal valuation due to poor contrast bolus timing. No large vessel occlusion. Specifically, bilateral P1 and P2 PCA segments are patent. Perfusion imaging demonstrates no evidence of core infarction and 55 mL penumbra involving the bilateral occipital lobes with some parietal involvement. Again, bolus is suboptimal but the area of involvement fits with reported symptoms. Plaque at the ICA origins causes less than 50% stenosis. These results were communicated to Dr. Liliane Channel at 12:28 pm on 11/26/2020 by text page via the [facility] messaging system. Electronically Signed   By: Guadlupe Spanish M.D.   On: 11/26/2020 12:53   DG Chest Portable 1 View  Result Date: 11/13/2020 CLINICAL DATA:  COPD exacerbation. EXAM: PORTABLE CHEST 1 VIEW COMPARISON:  11/07/2020 FINDINGS: Cardiomegaly. Aortic atherosclerosis. Biapical scarring. No acute confluent opacities or effusions. Mild hyperinflation. No acute bony abnormality. IMPRESSION: Cardiomegaly, COPD.  Chronic changes.  No active disease. Electronically Signed   By: Charlett Nose M.D.   On: 11/13/2020 21:23   DG Chest Portable 1 View  Result Date: 11/07/2020 CLINICAL DATA:  Shortness of breath EXAM: PORTABLE CHEST 1 VIEW COMPARISON:  08/31/2020 FINDINGS: Defibrillator pads overlying the mediastinum and left lower hemithorax. Lungs are essentially clear.  No pleural effusion or pneumothorax. The heart is normal in size. IMPRESSION: No evidence of acute cardiopulmonary disease. Electronically Signed   By: Charline Bills M.D.   On: 11/07/2020 06:48   ECHOCARDIOGRAM LIMITED  Result Date: 11/09/2020    ECHOCARDIOGRAM LIMITED REPORT   Patient Name:   Bridget [name] Date of Exam: 11/09/2020 Medical Rec  #:  [id number]      Height:  64.0 in Accession #:    [id number]     Weight:       125.0 lb Date of Birth:  [birth year]      BSA:          1.602 m Patient Age:    89 years       BP:           103/60 mmHg Patient Gender: F              HR:           77 bpm. Exam Location:  [facility] Procedure: 2D Echo Indications:     Dyspnea R06.00  History:         Patient has prior history of Echocardiogram examinations, most                  recent 08/31/2020.  Sonographer:     Wonda Cerise Referring Phys:  [id number] CHRISTOPHER RONALD BERGE Diagnosing Phys: Julien Nordmann MD IMPRESSIONS  1. Left ventricular ejection fraction, by estimation, is 55 %. The left ventricle has normal function. The left ventricle has no regional wall motion abnormalities.  2. Right ventricular systolic function is normal. The right ventricular size is mildly enlarged. There is moderately elevated pulmonary artery systolic pressure. The estimated right ventricular systolic pressure is 56.0 mmHg.  3. Left atrial size was mildly dilated.  4. Right atrial size was moderately dilated.  5. The mitral valve is normal in structure. Mild to moderate mitral valve regurgitation  6. The inferior vena cava is dilated in size with <50% respiratory variability, suggesting right atrial pressure of 15 mmHg. FINDINGS  Left Ventricle: Left ventricular ejection fraction, by estimation, is 55 %. The left ventricle has normal function. The left ventricle has no regional wall motion abnormalities. The left ventricular internal cavity size was normal in size. There is no left ventricular hypertrophy. Right Ventricle: The right ventricular size is mildly enlarged. No increase in right ventricular wall thickness. Right ventricular systolic function is normal. There is moderately elevated pulmonary artery systolic pressure. The tricuspid regurgitant velocity is 3.20 m/s, and with an assumed right atrial pressure of 15 mmHg, the estimated right ventricular systolic pressure is 56.0  mmHg. Left Atrium: Left atrial size was mildly dilated. Right Atrium: Right atrial size was moderately dilated. Pericardium: There is no evidence of pericardial effusion. Mitral Valve: The mitral valve is normal in structure. Mild to moderate mitral valve regurgitation. No evidence of mitral valve stenosis. Tricuspid Valve: The tricuspid valve is normal in structure. Tricuspid valve regurgitation is mild . No evidence of tricuspid stenosis. Aortic Valve: The aortic valve is normal in structure. Aortic valve regurgitation is not visualized. No aortic stenosis is present. Aortic valve peak gradient measures 5.0 mmHg. Pulmonic Valve: The pulmonic valve was normal in structure. Pulmonic valve regurgitation is not visualized. No evidence of pulmonic stenosis. Aorta: The aortic root is normal in size and structure. Venous: The inferior vena cava is dilated in size with less than 50% respiratory variability, suggesting right atrial pressure of 15 mmHg. IAS/Shunts: No atrial level shunt detected by color flow Doppler. LEFT VENTRICLE PLAX 2D LVIDd:         3.93 cm Diastology LVIDs:         2.64 cm LV e' medial:  5.00 cm/s LV PW:         1.07 cm LV e' lateral: 6.96 cm/s LV IVS:        1.19 cm  LEFT ATRIUM  Index      RIGHT ATRIUM           Index LA diam:    4.70 cm 2.93 cm/m RA Area:     24.80 cm                                RA Volume:   82.70 ml  51.62 ml/m  AORTIC VALVE AV Vmax:      112.00 cm/s AV Peak Grad: 5.0 mmHg TRICUSPID VALVE TV Peak grad:   38.7 mmHg TV Vmax:        3.11 m/s TR Peak grad:   41.0 mmHg TR Vmax:        320.00 cm/s Julien Nordmann MD Electronically signed by Julien Nordmann MD Signature Date/Time: 11/09/2020/12:23:50 PM    Final      PHYSICAL EXAM  Temp:  [97.8 F (36.6 C)-99.3 F (37.4 C)] 97.9 F (36.6 C) (06/17 0400) Pulse Rate:  [63-166] 78 (06/17 0900) Resp:  [16-24] 18 (06/17 0900) BP: (92-163)/(58-94) 121/63 (06/17 0900) SpO2:  [85 %-100 %] 100 % (06/17 0900) Weight:  [58.8  kg-65.6 kg] 58.8 kg (06/16 1645)  General - Cachectic, well developed, in no apparent distress.  Ophthalmologic - fundi not visualized due to noncooperation.  Cardiovascular - irregularly irregular heart rate and rhythm.  Neuro - awake, alert, eyes open, orientated to age, place, but not to time. No aphasia, fluent language, following all simple commands. Able to name and repeat. No gaze palsy, tracking bilaterally, visual field testing showed right lower quadrantanopia, b/l upper quadrant inconsistent on visual field testing but seems able to see some, left lowe quadrant intact visual field, PERRL. No facial droop. Tongue midline. Bilateral UEs 4/5, no drift. Bilaterally LEs 3+/5, no drift. Sensation symmetrical bilaterally except LLE feels more sensitive than RLE, b/l FTN intact grossly, gait not tested.     ASSESSMENT/PLAN Bridget [name] is a 87 y.o. female with history of A. fib not on AC, CHF, CKD, dementia admitted for blindness.  tPA was given.  Stroke:  bilateral scattered posterior circulation infarcts s/p tPA, embolic, secondary to A. fib not on AC Resultant improving vision CT no acute abnormality CTA head and neck bilateral ICA atherosclerosis but less than 50% stenosis. CT perfusion posterior circulation positive for penumbra MRI bilateral scattered posterior circulation infarct including bilateral PCA and bilateral cerebellum, however subtle FLAIR changes. MRI repeat continued bilateral scattered posterior circulation infarcts, no new infarct.  Interval left PCA complete and petechial hemorrhagic conversion CT repeat in a.m. pending 2D Echo EF 55% in 10/2020 LDL 68 HgbA1c pending SCDs for VTE prophylaxis  aspirin 81 mg daily prior to admission, now on aspirin 81 mg daily given improvement petechial hemorrhagic conversion.  Discussed with family, will consider Eliquis in 5 to 7 days post stroke. Patient counseled to be compliant with her antithrombotic medications Ongoing  aggressive stroke risk factor management Therapy recommendations: Pending Disposition: Pending  Chronic A. Fib Per daughter, patient was not on AC due to fall risk However, patient has no falls at home in the care of daughter Discussed with daughter, she will agree with anticoagulation Rate controlled On aspirin 81 now, will consider Eliquis in 5 to 7 days post stroke given confluent hemorrhagic conversion and the current stroke size.  Hypertension Stable BP goal less than 180/105 after tPA On home bisprolol and Lasix Long term BP goal normotensive  Hyperlipidemia Home meds: Lipitor 10  LDL 68, goal < 70 Now on home Lipitor 10 Continue statin at discharge  Other Stroke Risk Factors Advanced age CHF  Other Active Problems Mild dementia, Baseline able to walk with a walker  Hospital day # 1  This patient is critically ill due to stroke s/p tPA, [facility] and at significant risk of neurological worsening, death form recurrent stroke, hemorrhagic conversion, bleeding from tPA, heart failure. This patient's care requires constant monitoring of vital signs, hemodynamics, respiratory and cardiac monitoring, review of multiple databases, neurological assessment, discussion with family, other specialists and medical decision making of high complexity. I spent 45 minutes of neurocritical care time in the care of this patient. I had long discussion with daughter over the phone, updated pt current condition, treatment plan and potential prognosis, and answered all the questions. She expressed understanding and appreciation.    Marvel PlanJindong Lennan Malone, MD PhD Stroke Neurology 11/27/2020 10:01 AM    To contact Stroke Continuity provider, please refer to [email or web address]. After hours, contact General Neurology

## 2020-11-27 NOTE — Evaluation (Signed)
Physical Therapy Evaluation Patient Details Name: Bridget [name] MRN: [id number] DOB: [birth year] Today's Date: 11/27/2020   History of Present Illness  The pt is a 87 yo female presenting 6/16 due to acute onset blindness. Pt received IV tPA due to concern for stroke, imaging revealed bilateral scattered posterior circulation infarcts on 6/16, repeat MRI on 6/17 showing parenchymal hemorrhage in the medial left occipital lobe. PMH includes: HFpEF, CKD III, COPD, HTN, dementia, and afib.   Clinical Impression  Pt OOB in recliner upon arrival of PT, agreeable to evaluation at this time. Prior to admission the pt was mobilizing at home with intermittent use of quad cane, but able to walk at times without DME. The pt's family also reports she has an aide that assists with ADLs, but the pt attempts to assist with activities such as cleaning and food prep. The pt now presents with limitations in functional mobility, strength, power, safety awareness, and endurance due to above dx on top of chronic conditions, and will continue to benefit from skilled PT to address these deficits. The pt was able to complete sit-stand transfer with minA to power up and steady, but reaches for BUE support to maintain comfort at this time. The pt was able to complete short bout of ambulation in her room with bilateral HHA, but reports weakness and fatigue preventing her from continued mobility. The pt presents with decreased vision in R eye and decreased awareness of this deficits, needed max cues with all mobility to attend to R side for obstacles in environment. Pt with good support at home, will benefit from HHPT when medically ready for d/c.      Follow Up Recommendations Home health PT;Supervision/Assistance - 24 hour    Equipment Recommendations  3in1 (PT) (tub bench)    Recommendations for Other Services       Precautions / Restrictions Precautions Precautions: Fall Precaution Comments: R vision  deficits Restrictions Weight Bearing Restrictions: No      Mobility  Bed Mobility Overal bed mobility: Needs Assistance             General bed mobility comments: pt OOB in recliner at start and end of session    Transfers Overall transfer level: Needs assistance Equipment used: 1 person hand held assist Transfers: Sit to/from Stand Sit to Stand: Min assist         General transfer comment: minA to power up with minA to steady pt reaching for BUE support  Ambulation/Gait Ambulation/Gait assistance: Min assist Gait Distance (Feet): 25 Feet Assistive device: 2 person hand held assist Gait Pattern/deviations: Step-through pattern;Decreased stride length;Shuffle;Trunk flexed;Narrow base of support Gait velocity: Decreased Gait velocity interpretation: <1.31 ft/sec, indicative of household ambulator General Gait Details: pt reaching for BUE support, increased confidence but no change in lateral sway. VSS with mobility,      Modified Rankin (Stroke Patients Only) Modified Rankin (Stroke Patients Only) Pre-Morbid Rankin Score: Moderately severe disability Modified Rankin: Moderately severe disability     Balance Overall balance assessment: Needs assistance Sitting-balance support: Feet supported Sitting balance-Leahy Scale: Good Sitting balance - Comments: no UE support, no LOB   Standing balance support: Bilateral upper extremity supported Standing balance-Leahy Scale: Poor Standing balance comment: reliant on BUE support.                             Pertinent Vitals/Pain Pain Assessment: No/denies pain    Home Living Family/patient expects to be discharged to::  Private residence Living Arrangements: Children Available Help at Discharge: Family;Available 24 hours/day Type of Home: House Home Access: Stairs to enter   [employer] of Steps: 1 Home Layout: One level Home Equipment: Walker - 2 wheels;Cane - single point;Grab bars -  tub/shower;Walker - 4 wheels;Wheelchair - manual      Prior Function Level of Independence: Needs assistance   Gait / Transfers Assistance Needed: pt reports mostly uses cane, but can ambulate in the home without AD  ADL's / Homemaking Assistance Needed: aide comes daily to assist with dressing, bathing, and general mobility        Hand Dominance   Dominant Hand: Right    Extremity/Trunk Assessment   Upper Extremity Assessment Upper Extremity Assessment: Generalized weakness;Defer to OT evaluation    Lower Extremity Assessment Lower Extremity Assessment: Generalized weakness (sores on distal legs, but pt reports sensation intact bilaterally, no change since PTA. grossly 4-/5 bilaterally)    Cervical / Trunk Assessment Cervical / Trunk Assessment: Kyphotic  Communication   Communication: No difficulties  Cognition Arousal/Alertness: Awake/alert Behavior During Therapy: WFL for tasks assessed/performed Overall Cognitive Status: History of cognitive impairments - at baseline (memory, dementia)                                 General Comments: pt unable to determine place, situation, or deficits. Thought she was at her daughters house. able to follow all cues, needed increased time or cues for safety      General Comments General comments (skin integrity, edema, etc.): decreased safety awareness and awareness of deficits. Pt unable to accurately identify number of fingers in R visual field until held at midline    Exercises     Assessment/Plan    PT Assessment Patient needs continued PT services  PT Problem List Decreased strength;Decreased mobility;Decreased safety awareness;Decreased activity tolerance;Decreased cognition;Cardiopulmonary status limiting activity;Decreased balance;Decreased knowledge of use of DME       PT Treatment Interventions DME instruction;Therapeutic activities;Gait training;Therapeutic exercise;Patient/family education;Stair  training;Functional mobility training;Balance training    PT Goals (Current goals can be found in the Care Plan section)  Acute Rehab PT Goals Patient Stated Goal: Get stronger PT Goal Formulation: With patient Time For Goal Achievement: 12/12/20 Potential to Achieve Goals: Good    Frequency Min 4X/week    AM-PAC PT "6 Clicks" Mobility  Outcome Measure Help needed turning from your back to your side while in a flat bed without using bedrails?: A Little Help needed moving from lying on your back to sitting on the side of a flat bed without using bedrails?: A Little Help needed moving to and from a bed to a chair (including a wheelchair)?: A Little Help needed standing up from a chair using your arms (e.g., wheelchair or bedside chair)?: A Little Help needed to walk in hospital room?: A Little Help needed climbing 3-5 steps with a railing? : A Little 6 Click Score: 18    End of Session Equipment Utilized During Treatment: Gait belt Activity Tolerance: Patient tolerated treatment well Patient left: in chair;with call bell/phone within reach;with chair alarm set Nurse Communication: Mobility status PT Visit Diagnosis: Unsteadiness on feet (R26.81);Other abnormalities of gait and mobility (R26.89);Muscle weakness (generalized) (M62.81)    Time: 9622-2979 PT Time Calculation (min) (ACUTE ONLY): 32 min   Charges:   PT Evaluation $PT Eval Low Complexity: 1 Low PT Treatments $Gait Training: 8-22 mins  Rolm Baptise, PT, DPT   Acute Rehabilitation Department Pager #: [phone]  Gaetana Michaelis 11/27/2020, 5:50 PM

## 2020-11-27 NOTE — Progress Notes (Signed)
OT Cancellation Note  Patient Details Name: Bridget [name] MRN: [id number] DOB: 1926-06-06   Cancelled Treatment:    Reason Eval/Treat Not Completed: (P) Patient at procedure or test/ unavailable (Nsg completing in/out cath. Will return later time as schedule allows.)  [facility] 11/27/2020, 12:29 PM Luisa Dago, OT/L   Acute OT Clinical Specialist Acute Rehabilitation Services Pager [phone] Office [phone]

## 2020-11-27 NOTE — Progress Notes (Signed)
OT Cancellation Note  Patient Details Name: Bridget [name] MRN: [id number] DOB: [birth year]   Cancelled Treatment:    Reason Eval/Treat Not Completed: Active bedrest order  [facility] Davionte Lusby, OT/L   Acute OT Clinical Specialist Acute Rehabilitation Services Pager [phone] Office [phone]  11/27/2020, 9:21 AM

## 2020-11-28 ENCOUNTER — Inpatient Hospital Stay (HOSPITAL_COMMUNITY): Payer: Medicare Other

## 2020-11-28 DIAGNOSIS — I6300 Cerebral infarction due to thrombosis of unspecified precerebral artery: Secondary | ICD-10-CM

## 2020-11-28 LAB — HEMOGLOBIN A1C
Hgb A1c MFr Bld: 6.3 % — ABNORMAL HIGH (ref 4.8–5.6)
Mean Plasma Glucose: 134 mg/dL

## 2020-11-28 NOTE — Evaluation (Signed)
Occupational Therapy Evaluation Patient Details Name: Bridget [name] MRN: [id number] DOB: 1925/12/15 Today's Date: 11/28/2020    History of Present Illness The pt is a 87 yo female presenting 6/16 due to acute onset blindness. Pt received IV tPA due to concern for stroke, imaging revealed bilateral scattered posterior circulation infarcts on 6/16, repeat MRI on 6/17 showing parenchymal hemorrhage in the medial left occipital lobe. PMH includes: HFpEF, CKD III, COPD, HTN, dementia, and afib.   Clinical Impression   Patient admitted for the above diagnosis and complicated medical stay.  She is presenting with visual impairments to both L and R visual field, with R visual impairment worse than the L.  PTA she lives with her daughter, and between her family and a PCA, she has 24 hour assist as needed.  The patient needed assist with bathing and dressing, walked ad lib in her home, and did participate with light meal prep and home management.  Barriers are listed below.  Currently she is needing up to Min A for basic mobility and VC's for visual compensation and RW management.  Home with continued 24 hour assist and [facility] services is reasonable.  OT will follow in the acute setting to maximize functional status.      Follow Up Recommendations  Home health OT;Supervision/Assistance - 24 hour    Equipment Recommendations  None recommended by OT    Recommendations for Other Services       Precautions / Restrictions Precautions Precautions: Fall Precaution Comments: R and L visual deficits.  She attends/sees better to the L, but has a field cut to that side as well. Restrictions Weight Bearing Restrictions: No      Mobility Bed Mobility Overal bed mobility: Needs Assistance Bed Mobility: Supine to Sit     Supine to sit: Supervision;HOB elevated       Patient Response: Cooperative  Transfers Overall transfer level: Needs assistance Equipment used: 1 person hand held assist;Rolling  walker (2 wheeled) Transfers: Sit to/from [employer] Sit to Stand: Min guard Stand pivot transfers: Min assist       General transfer comment: visual impairments and RW management.    Balance Overall balance assessment: Needs assistance Sitting-balance support: Feet supported Sitting balance-Leahy Scale: Good     Standing balance support: Bilateral upper extremity supported Standing balance-Leahy Scale: Poor Standing balance comment: reliant on BUE support.                           ADL either performed or assessed with clinical judgement   ADL Overall ADL's : Needs assistance/impaired Eating/Feeding: Set up;Cueing for compensatory techinques   Grooming: Wash/dry hands;Wash/dry face;Oral care;Min guard;Cueing for compensatory techniques;Sitting;Standing Grooming Details (indicate cue type and reason): visual impairments         Upper Body Dressing : Minimal assistance;Sitting       Toilet Transfer: Minimal assistance;Ambulation;RW Toilet Transfer Details (indicate cue type and reason): visual impairments and RW management Toileting- Clothing Manipulation and Hygiene: Supervision/safety;Sitting/lateral lean       Functional mobility during ADLs: Minimal assistance;Cueing for sequencing;Rolling walker       Vision Patient Visual Report: Peripheral vision impairment Saccades: Within functional limits Visual Fields: Left visual field deficit;Right visual field deficit     Perception     Praxis      Pertinent Vitals/Pain Pain Assessment: No/denies pain     Hand Dominance Right   Extremity/Trunk Assessment Upper Extremity Assessment Upper Extremity Assessment: Overall [facility]  for tasks assessed (UB MMT is functional for her age)   Lower Extremity Assessment Lower Extremity Assessment: Defer to PT evaluation   Cervical / Trunk Assessment Cervical / Trunk Assessment: Kyphotic   Communication Communication Communication: No  difficulties   Cognition Arousal/Alertness: Awake/alert Behavior During Therapy: WFL for tasks assessed/performed Overall Cognitive Status: History of cognitive impairments - at baseline                                                      Home Living Family/patient expects to be discharged to:: Private residence Living Arrangements: Children Available Help at Discharge: Family;Available 24 hours/day Type of Home: House Home Access: Stairs to enter [employer] of Steps: 1   Home Layout: One level     Bathroom Shower/Tub: Chief Strategy Officer: Standard     Home Equipment: Environmental consultant - 2 wheels;Cane - single point;Grab bars - tub/shower;Walker - 4 wheels;Wheelchair - manual      Lives With: Daughter    Prior Functioning/Environment Level of Independence: Needs assistance  Gait / Transfers Assistance Needed: Per PT: pt reports mostly uses cane, but can ambulate in the home without AD ADL's / Homemaking Assistance Needed: Per PT: aide comes daily to assist with dressing, bathing, and general mobility and anything else she may need.  PCA is 2x/wk for up to 3 hours.   Comments: Daughter works from home and can assist as needed        OT Problem List: Decreased strength;Decreased activity tolerance;Decreased knowledge of use of DME or AE;Cardiopulmonary status limiting activity;Impaired vision/perception      OT Treatment/Interventions: Self-care/ADL training;DME and/or AE instruction;Therapeutic activities;Therapeutic exercise;Energy conservation;Patient/family education    OT Goals(Current goals can be found in the care plan section) Acute Rehab OT Goals Patient Stated Goal: I just want to do what you tell me, so I can get better OT Goal Formulation: With patient Time For Goal Achievement: 12/12/20 Potential to Achieve Goals: Good ADL Goals Pt Will Perform Grooming: with supervision;sitting;standing Pt Will Perform Upper Body  Bathing: with set-up;sitting Pt Will Perform Upper Body Dressing: with set-up;sitting;standing Pt Will Transfer to Toilet: with set-up;ambulating;regular height toilet Pt Will Perform Toileting - Clothing Manipulation and hygiene: with modified independence;sit to/from stand  OT Frequency: Min 2X/week   Barriers to D/C:    none noted       Co-evaluation              AM-PAC OT "6 Clicks" Daily Activity     Outcome Measure Help from another person eating meals?: None Help from another person taking care of personal grooming?: A Little Help from another person toileting, which includes using toliet, bedpan, or urinal?: A Little Help from another person bathing (including washing, rinsing, drying)?: A Lot Help from another person to put on and taking off regular upper body clothing?: A Little Help from another person to put on and taking off regular lower body clothing?: A Lot 6 Click Score: 17   End of Session Equipment Utilized During Treatment: Rolling walker  Activity Tolerance: Patient tolerated treatment well Patient left: in chair;with call bell/phone within reach;with chair alarm set  OT Visit Diagnosis: Unsteadiness on feet (R26.81);Muscle weakness (generalized) (M62.81)                Time: 1030-1056 OT Time Calculation (min):  26 min Charges:  OT General Charges $OT Visit: 1 Visit OT Evaluation $OT Eval Moderate Complexity: 1 Mod OT Treatments $Self Care/Home Management : 8-22 mins  11/28/2020  Rich, OTR/L  Acute Rehabilitation Services  Office:  [phone]   Bridget [name] 11/28/2020, 11:09 AM

## 2020-11-28 NOTE — Progress Notes (Signed)
Physical Therapy Treatment Patient Details Name: Bridget [name] MRN: [id number] DOB: 11/27/1925 Today's Date: 11/28/2020    History of Present Illness The pt is a 87 yo female presenting 6/16 due to acute onset blindness. Pt received IV tPA due to concern for stroke, imaging revealed bilateral scattered posterior circulation infarcts on 6/16, repeat MRI on 6/17 showing parenchymal hemorrhage in the medial left occipital lobe. PMH includes: HFpEF, CKD III, COPD, HTN, dementia, and afib.    PT Comments    Pt progressing towards her physical therapy goals. Ambulating x 60 feet with a walker and min assist. Requires consistent cues for environmental negotiation and scanning due to visual deficits (both fields, R worse than L). D/c plan remains appropriate.     Follow Up Recommendations  Home health PT;Supervision/Assistance - 24 hour     Equipment Recommendations  3in1 (PT) (tub bench)    Recommendations for Other Services       Precautions / Restrictions Precautions Precautions: Fall Precaution Comments: R and L visual deficits.  She attends/sees better to the L, but has a field cut to that side as well. Restrictions Weight Bearing Restrictions: No    Mobility  Bed Mobility Overal bed mobility: Needs Assistance Bed Mobility: Supine to Sit     Supine to sit: Supervision;HOB elevated     General bed mobility comments: OOB in recliner    Transfers Overall transfer level: Needs assistance Equipment used: Rolling walker (2 wheeled) Transfers: Sit to/from Stand Sit to Stand: Supervision Stand pivot transfers: Min assist       General transfer comment: visual impairments and RW management.  Ambulation/Gait Ambulation/Gait assistance: Min assist Gait Distance (Feet): 60 Feet Assistive device: Rolling walker (2 wheeled) Gait Pattern/deviations: Step-through pattern;Decreased stride length;Shuffle;Trunk flexed;Narrow base of support Gait velocity: decreased Gait velocity  interpretation: <1.8 ft/sec, indicate of risk for recurrent falls General Gait Details: Cues for visual scanning, obstacle/environmental negotiation. Pt requiring light minA for balance and for manual walker assist intermittently.   Stairs             Wheelchair Mobility    Modified Rankin (Stroke Patients Only) Modified Rankin (Stroke Patients Only) Pre-Morbid Rankin Score: Moderately severe disability Modified Rankin: Moderately severe disability     Balance Overall balance assessment: Needs assistance Sitting-balance support: Feet supported Sitting balance-Leahy Scale: Good     Standing balance support: Bilateral upper extremity supported Standing balance-Leahy Scale: Poor Standing balance comment: reliant on BUE support.                            Cognition Arousal/Alertness: Awake/alert Behavior During Therapy: WFL for tasks assessed/performed Overall Cognitive Status: History of cognitive impairments - at baseline                                 General Comments: Decreased awareness of deficits, follows multi step commands inconsistently      Exercises Other Exercises Other Exercises: Seated: functional reaching task to promote dynamic balance and visual targeting    General Comments        Pertinent Vitals/Pain Pain Assessment: No/denies pain    Home Living Family/patient expects to be discharged to:: Private residence Living Arrangements: Children Available Help at Discharge: Family;Available 24 hours/day Type of Home: House Home Access: Stairs to enter   Home Layout: One level Home Equipment: Environmental consultant - 2 wheels;Cane - single point;Grab bars - tub/shower;Walker -  4 wheels;Wheelchair - manual      Prior Function Level of Independence: Needs assistance  Gait / Transfers Assistance Needed: Per PT: pt reports mostly uses cane, but can ambulate in the home without AD ADL's / Homemaking Assistance Needed: Per PT: aide comes  daily to assist with dressing, bathing, and general mobility and anything else she may need.  PCA is 2x/wk for up to 3 hours. Comments: Daughter works from home and can assist as needed   PT Goals (current goals can now be found in the care plan section) Acute Rehab PT Goals Patient Stated Goal: I just want to do what you tell me, so I can get better Potential to Achieve Goals: Good Progress towards PT goals: Progressing toward goals    Frequency    Min 4X/week      PT Plan Current plan remains appropriate    Co-evaluation              AM-PAC PT "6 Clicks" Mobility   Outcome Measure  Help needed turning from your back to your side while in a flat bed without using bedrails?: A Little Help needed moving from lying on your back to sitting on the side of a flat bed without using bedrails?: A Little Help needed moving to and from a bed to a chair (including a wheelchair)?: A Little Help needed standing up from a chair using your arms (e.g., wheelchair or bedside chair)?: A Little Help needed to walk in hospital room?: A Little Help needed climbing 3-5 steps with a railing? : A Little 6 Click Score: 18    End of Session Equipment Utilized During Treatment: Gait belt Activity Tolerance: Patient tolerated treatment well Patient left: in chair;with call bell/phone within reach;with chair alarm set Nurse Communication: Mobility status PT Visit Diagnosis: Unsteadiness on feet (R26.81);Other abnormalities of gait and mobility (R26.89);Muscle weakness (generalized) (M62.81)     Time: 2957-4734 PT Time Calculation (min) (ACUTE ONLY): 17 min  Charges:  $Gait Training: 8-22 mins                     Lillia Pauls, PT, DPT Acute Rehabilitation Services Pager [phone] Office [phone]    Norval Morton 11/28/2020, 1:44 PM

## 2020-11-28 NOTE — Plan of Care (Signed)

## 2020-11-28 NOTE — Progress Notes (Addendum)
STROKE TEAM PROGRESS NOTE   INTERVAL HISTORY Bridget [name] was sitting in the chair on my visit. She was in good spirits. No acute events since most recent neurology visit. She notes that her vision has improved since arrival.  Repeat CT head this afternoon showed stable infarct from previous imaging studies. No hemorrhagic transformation or significant intracranial mass effect noted.  Considering this new visual field deficit I spoke with daughter [name] regarding the patient's home life. Currently lives with daughter. During the day she is with the family but daughter and her husband leave the home for hours at a time and is completely unattended to at night. Prior to this event, Bridget [name] PCP agreed this was OK. Now that her mother needs 24 hour care, I suggested that [name] and the family should speak with social work regarding support or placement options; she agreed.   PERTINENT IMAGING  CT head 18Jun2022 Left PCA territory infarct with stable confluent petechial hemorrhage since the MRI yesterday. Stable confluent medial Right PCA territory and scattered bilateral cerebellar infarcts since yesterday without new hemorrhagic transformation. No significant intracranial mass effect. No new intracranial abnormality.  MR brain 17Jun2022 Stable size of acute/subacute infarcts involving the PCA distributions bilaterally. No new infarcts Interval parenchymal hemorrhage in the medial left occipital lobe infarct. Stable punctate nonhemorrhagic infarct in the left frontal lobe white matter. Remote lacunar infarcts of the cerebellum bilaterally. Moderate diffuse white matter disease likely reflects the sequela of chronic microvascular ischemia Diffuse sinus disease. Right mastoid effusion. No obstructing nasopharyngeal lesion is evident.  MR brain 16Jun2022 Acute posterior circulation infarcts involving bilateral occipital lobes, right posteromedial temporal lobe, and cerebellum. Punctate acute  infarct of the left frontal white matter.   CTA head/neck with perfusion study 16Jun2022 Suboptimal valuation due to poor contrast bolus timing. No large vessel occlusion. Specifically, bilateral P1 and P2 PCA segments are patent. Perfusion imaging demonstrates no evidence of core infarction and 55 mL penumbra involving the bilateral occipital lobes with some parietal involvement. Again, bolus is suboptimal but the area of involvement fits with reported symptoms. Plaque at the ICA origins causes less than 50% stenosis.  CT head 16Jun2022 No acute intracranial findings by CT. Mild chronic microvascular ischemic change and cerebral volume loss. Paranasal sinus disease with air-fluid levels. Correlate for signs and symptoms of acute sinusitis.  Vitals:   11/28/20 0418 11/28/20 0800 11/28/20 1200 11/28/20 1600  BP: 125/65 (!) 114/91 106/70 (!) 102/55  Pulse: 62 82 60 62  Resp: 16 20 20 18   Temp: 97.6 F (36.4 C) 98.1 F (36.7 C) 98.1 F (36.7 C) (!) 97.3 F (36.3 C)  TempSrc: Oral Oral Oral Oral  SpO2: 99% 95% 96% 98%  Weight:      Height:        CBC:  Recent Labs  Lab 11/26/20 1050  WBC 13.5*  NEUTROABS 11.4*  HGB 17.6*  HCT 52.9*  MCV 93.6  PLT 174    Basic Metabolic Panel:  Recent Labs  Lab 11/26/20 1050  NA 138  K 3.8  CL 95*  CO2 34*  GLUCOSE 123*  BUN 24*  CREATININE 0.88  CALCIUM 9.1    Lipid Panel:  Recent Labs  Lab 11/27/20 0436  CHOL 120  TRIG 64  HDL 39*  CHOLHDL 3.1  VLDL 13  LDLCALC 68   HgbA1c:  Lab Results  Component Value Date   HGBA1C 6.3 (H) 11/27/2020   Urine Drug Screen: No results found for: LABOPIA, COCAINSCRNUR, LABBENZ, AMPHETMU,  THCU, LABBARB  Alcohol Level No results found for: ETH   EXAMINATION HEENT-  Normocephalic, no lesions, without obvious abnormality.  Normal external eye and conjunctiva.  Wears glasses at baseline Cardiovascular- perfusing well Lungs-breathing comfortably on room air. Some labored breathing  and wheezing during exam that resolved with rest. Abdomen- Soft Extremities-No obvious abnormality Skin-warm and dry, intact  Neurologic Exam: General: NAD Mental Status: Alert, oriented to self and general location, not to month/year/president, thought content appropriate.  Speech fluent without evidence of aphasia, minimal dysarthria.  Able to follow commands without difficulty. Cranial Nerves: II:  Right hemianopia, pupils equal, round, reactive to light III,IV, VI: ptosis not present, extra-ocular motions intact bilaterally V,VII: smile symmetric, facial light touch sensation normal bilaterally VIII: hearing normal bilaterally IX,X: uvula rises symmetrically XI: bilateral shoulder shrug antigravity XII: midline tongue extension without atrophy or fasciculations Motor: Moves all extremities antigravity. Tone and bulk appropriate for age Sensory: Light touch equal and intact throughout, bilaterally Cerebellar: Normal finger-to-nose Gait: Deferred  ASSESSMENT/PLAN Bridget [name] is a 87 y.o. female with past medical history significant for atrial fibrillation not on anticoagulation secondary to perceived fall risk, heart failure with preserved EF, chronic kidney disease, mild dementia (baseline disoriented to month/year but ambulatory with walker and able to perform ADLs, make coffee for herself etc.). On 16Jun2022, family discovered Ms. Heiss unable to see around 9:30AM. She was brought to [facility] for evaluation.   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 tPA was given due to the severity of her visual field deficit and excellent baseline functional status despite her memory impairment. Imaging has been  stable since initial insult and Ms. Driggs condition has subjectively improved, although she does have a complete right hemianopia. Moving forward with stroke workup. - Labs complete:     - HgbA1c: 6.3; goal <7    - Lipids: LDL 68; goal <70, continue lipitor 10mg  - Repeat MRI ordered - TTE ordered - BP control: Continue bisoprolol 5mg  - DAPT: Continue 81mg  ASA and 10mg  lipitor  # Dementia - SLP consulted for cognitive assessment - Social work consulted to discuss discharge options with patient's family considering baseline dementia in the setting of new-onset visual field cut  #CHF - Continue Lasix  # COPD - Duoneb available PRN  # afib - Currently DAPT - TTE ordered - On telemetry - Suggest outpatient cardiology follow-up for continued management  # Dispo - Plan to keep inpatient over the weekend  Hospital day # 2  Neurology  I have personally obtained history,examined this patient, reviewed notes, independently viewed imaging studies, participated in medical decision making and plan of care.ROS completed by me personally and pertinent positives fully documented  I have made any additions or clarifications directly to the above note. Agree with note above.  I have spent a total of  25  minutes with the patient reviewing hospital notes,  test results, labs and examining the patient as well as establishing an assessment and plan that was discussed personally with the patient.  > 50% of time was spent in direct patient care.       , MD Medical Director [facility] Pager: [phone] 11/29/2020 8:54 AM

## 2020-11-29 ENCOUNTER — Inpatient Hospital Stay (HOSPITAL_COMMUNITY): Payer: Medicare Other

## 2020-11-29 DIAGNOSIS — I6389 Other cerebral infarction: Secondary | ICD-10-CM

## 2020-11-29 LAB — ECHOCARDIOGRAM COMPLETE BUBBLE STUDY
Area-P 1/2: 4.52 cm2
P 1/2 time: 520 msec
S' Lateral: 2.5 cm

## 2020-11-29 NOTE — Progress Notes (Signed)
Daughter Dolores Hoose at bedside, some concern about patient care needs after discharge dt continued peripheral vision problems. Patient currently lives at home with daughter and son-in-law. Daughter wishes to speak to PT/OT when possible about patient mobility/ ADL's,   Daughter [name]'s cell phone number is [phone]

## 2020-11-29 NOTE — Plan of Care (Signed)

## 2020-11-29 NOTE — Progress Notes (Signed)
0845 Echo technician at bedside performing bubble study. Will get morning vitals when they are finished.

## 2020-11-29 NOTE — Progress Notes (Signed)
  Echocardiogram 2D Echocardiogram has been performed.  Bridget [name] 11/29/2020, 9:16 AM

## 2020-11-29 NOTE — Progress Notes (Signed)
STROKE TEAM PROGRESS NOTE   INTERVAL HISTORY She is sitting in the bed. She was in good spirits. No acute events since most recent neurology visit. She has no new complaints.  2D echo has been done this morning and shows normal ejection fraction of 65 to 70% without significant wall motion abnormalities.  Sluggish flow is noted in the IVC suggestive of venous stasis.  IVC is dilated in size.  Marland Kitchen  Await family's discussion with social worker about her disposition and goals of care Vital signs are stable.  Neurological exam is unchanged. PERTINENT IMAGING  CT head 18Jun2022 Left PCA territory infarct with stable confluent petechial hemorrhage since the MRI yesterday. Stable confluent medial Right PCA territory and scattered bilateral cerebellar infarcts since yesterday without new hemorrhagic transformation. No significant intracranial mass effect. No new intracranial abnormality.  MR brain 17Jun2022 Stable size of acute/subacute infarcts involving the PCA distributions bilaterally. No new infarcts Interval parenchymal hemorrhage in the medial left occipital lobe infarct. Stable punctate nonhemorrhagic infarct in the left frontal lobe white matter. Remote lacunar infarcts of the cerebellum bilaterally. Moderate diffuse white matter disease likely reflects the sequela of chronic microvascular ischemia Diffuse sinus disease. Right mastoid effusion. No obstructing nasopharyngeal lesion is evident.  MR brain 16Jun2022 Acute posterior circulation infarcts involving bilateral occipital lobes, right posteromedial temporal lobe, and cerebellum. Punctate acute infarct of the left frontal white matter.   CTA head/neck with perfusion study 16Jun2022 Suboptimal valuation due to poor contrast bolus timing. No large vessel occlusion. Specifically, bilateral P1 and P2 PCA segments are patent. Perfusion imaging demonstrates no evidence of core infarction and 55 mL penumbra involving the bilateral occipital  lobes with some parietal involvement. Again, bolus is suboptimal but the area of involvement fits with reported symptoms. Plaque at the ICA origins causes less than 50% stenosis.  CT head 16Jun2022 No acute intracranial findings by CT. Mild chronic microvascular ischemic change and cerebral volume loss. Paranasal sinus disease with air-fluid levels. Correlate for signs and symptoms of acute sinusitis.  Vitals:   11/28/20 1949 11/29/20 0001 11/29/20 0317 11/29/20 0918  BP: 100/64 116/71 119/66 113/67  Pulse: 78 64 (!) 59 63  Resp: 18 17 17 18   Temp: (!) 97.5 F (36.4 C) 97.8 F (36.6 C) 98.4 F (36.9 C) (!) 97.5 F (36.4 C)  TempSrc: Oral Oral Oral Oral  SpO2: 96% 97% 96% 97%  Weight:      Height:        CBC:  Recent Labs  Lab 11/26/20 1050  WBC 13.5*  NEUTROABS 11.4*  HGB 17.6*  HCT 52.9*  MCV 93.6  PLT 174    Basic Metabolic Panel:  Recent Labs  Lab 11/26/20 1050  NA 138  K 3.8  CL 95*  CO2 34*  GLUCOSE 123*  BUN 24*  CREATININE 0.88  CALCIUM 9.1    Lipid Panel:  Recent Labs  Lab 11/27/20 0436  CHOL 120  TRIG 64  HDL 39*  CHOLHDL 3.1  VLDL 13  LDLCALC 68   HgbA1c:  Lab Results  Component Value Date   HGBA1C 6.3 (H) 11/27/2020   Urine Drug Screen: No results found for: LABOPIA, COCAINSCRNUR, LABBENZ, AMPHETMU, THCU, LABBARB  Alcohol Level No results found for: ETH   EXAMINATION HEENT-  Normocephalic, no lesions, without obvious abnormality.  Normal external eye and conjunctiva.  Wears glasses at baseline Cardiovascular- perfusing well Lungs-breathing comfortably on room air. Some labored breathing and wheezing during exam that resolved with rest. Abdomen- Soft Extremities-No  obvious abnormality Skin-warm and dry, intact  Neurologic Exam: General: NAD Mental Status: Alert, oriented to self and general location, not to month/year/president, thought content appropriate.  Speech fluent without evidence of aphasia, minimal dysarthria.  Able  to follow commands without difficulty. Cranial Nerves: II:  Right hemianopia, pupils equal, round, reactive to light III,IV, VI: ptosis not present, extra-ocular motions intact bilaterally V,VII: smile symmetric, facial light touch sensation normal bilaterally VIII: hearing normal bilaterally IX,X: uvula rises symmetrically XI: bilateral shoulder shrug antigravity XII: midline tongue extension without atrophy or fasciculations Motor: Moves all extremities antigravity. Tone and bulk appropriate for age Sensory: Light touch equal and intact throughout, bilaterally Cerebellar: Normal finger-to-nose Gait: Deferred  ASSESSMENT/PLAN Bridget [name] is a 87 y.o. female with past medical history significant for atrial fibrillation not on anticoagulation secondary to perceived fall risk, heart failure with preserved EF, chronic kidney disease, mild dementia (baseline disoriented to month/year but ambulatory with walker and able to perform ADLs, make coffee for herself etc.). On 16Jun2022, family discovered Bridget [name] unable to see around 9:30AM. She was brought to [facility] for evaluation.   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 tPA was given due to the severity of her visual field deficit and excellent baseline functional status despite her memory impairment. Imaging has been stable since initial insult and Bridget [name] condition has subjectively improved, although she does have a complete right hemianopia. Moving forward with stroke workup. - Labs complete:     - HgbA1c: 6.3; goal <7    - Lipids: LDL 68; goal <70, continue lipitor 10mg  - Repeat MRI ordered - TTE normal ejection fraction.  Dilated inferior vena cava -  BP control: Continue bisoprolol 5mg  - DAPT: Continue 81mg  ASA and 10mg  lipitor  # Dementia - SLP consulted for cognitive assessment - Social work consulted to discuss discharge options with patient's family considering baseline dementia in the setting of new-onset visual field cut  #CHF - Continue Lasix  # COPD - Duoneb available PRN  # afib - Currently DAPT - TTE ejection fraction 65 to 70%.  Dilated inferior vena cava - On telemetry - Suggest outpatient cardiology follow-up for continued management  # Dispo - Plan to keep inpatient over the weekend  Hospital day # 3   Continue ongoing management.  Social worker to discuss with family support and goals of care I have spent a total of  25  minutes with the patient reviewing hospital notes,  test results, labs and examining the patient as well as establishing an assessment and plan that was discussed personally with the patient.  > 50% of time was spent in direct patient care.    , MD 11/29/2020 1:03 PM

## 2020-11-29 NOTE — Evaluation (Signed)
Speech Language Pathology Evaluation Patient Details Name: Bridget [name] MRN: [id number] DOB: [birth year] Today's Date: 11/29/2020 Time: 1720-1740 SLP Time Calculation (min) (ACUTE ONLY): 20 min  Problem List:  Patient Active Problem List   Diagnosis Date Noted   Ischemic stroke (HCC) 11/26/2020   Stroke (cerebrum) (HCC) 11/26/2020   Acute on chronic diastolic (congestive) heart failure (HCC) 11/13/2020   CKD (chronic kidney disease), stage II    Chronic diastolic CHF (congestive heart failure) (HCC)    Acute respiratory failure (HCC) 11/07/2020   Elevated troponin 11/07/2020   COPD with acute exacerbation (HCC) 08/31/2020   Atrial fibrillation, chronic (HCC) 08/31/2020   Acute CHF (congestive heart failure) (HCC) 08/31/2020   CKD (chronic kidney disease), stage III (HCC) 08/31/2020   Essential hypertension 07/06/2020   Acute lower UTI 07/06/2020   Acute respiratory disease due to COVID-19 virus 07/05/2020   Past Medical History:  Past Medical History:  Diagnosis Date   (HFpEF) heart failure with preserved ejection fraction (HCC)    a. 08/2020 Echo: EF 60-65%, no rwma, Gr1 DD, nl RV fxn. Mildly dil RA. Mild MR/AI.   Asthma    CKD (chronic kidney disease), stage III (HCC)    COPD (chronic obstructive pulmonary disease) (HCC)    Falls    Hypertension    Mild Dementia (HCC)    Permanent atrial fibrillation (HCC)    a. CHA2DS2VASc = 5-->no OAC 2/2 unsteady gait/falls.   Unsteady gait    Past Surgical History:  Past Surgical History:  Procedure Laterality Date   APPENDECTOMY     BREAST SURGERY     HERNIA REPAIR     HPI:  Bridget [name] is an 87 y.o. female with atrial fibrillation, mild dementia, COPD from [facility] after receiving tPA there for acute onset of cortical blindness suspected to be secondary to stroke occurring overnight during sleep. Per chart at baseline daughter reports she is not oriented to time/age but ambulatory with walker and able to perform ADLs (make coffee  for herself etc.) MRI acute posterior circulation infarcts involving bilateral occipital  lobes, right posteromedial temporal lobe, and cerebellum. Punctate acute infarct of the left frontal white matter   Assessment / Plan / Recommendation Clinical Impression  Patient demonstrates a cognitive impairment that is consistent with her h/o dementia and does not appear to be different from her baseline. SLP did not observe any speech or language impairments during this assessment with patient being fully intelligible and able to converse with SLP, respond to questions without difficulty. She was aware of the fact that she had a "slight stroke" and that she was in [place], guessing at hospital name saying "[facility]. She was fairly accurate on how long she has been in the hospital, stating "two days". SLP does not suspect that patient is suffering from any significant speech-language or cognitive impairment secondary to CVA and is likely at baseline with known memory and cognitive deficits from h/o dementia. SLP is not recommending f/u intervention but is recommending patient have consistent 24 hour supervision for safety.    SLP Assessment  SLP Recommendation/Assessment: Patient does not need any further Speech Lanaguage Pathology Services SLP Visit Diagnosis: Cognitive communication deficit (R41.841)    Follow Up Recommendations  24 hour supervision/assistance    Frequency and Duration           SLP Evaluation Cognition  Overall Cognitive Status: History of cognitive impairments - at baseline Arousal/Alertness: Awake/alert Orientation Level: Oriented to person;Oriented to situation;Disoriented to time;Oriented to place  Attention: Sustained Sustained Attention: Impaired Sustained Attention Impairment: Verbal basic Memory: Impaired Memory Impairment: Decreased recall of new information;Storage deficit Awareness: Impaired Awareness Impairment: Emergent impairment Problem Solving:  Impaired Problem Solving Impairment: Functional basic;Verbal basic Safety/Judgment: Impaired       Comprehension  Auditory Comprehension Overall Auditory Comprehension: Appears within functional limits for tasks assessed    Expression Expression Primary Mode of Expression: Verbal Verbal Expression Overall Verbal Expression: Appears within functional limits for tasks assessed Initiation: No impairment Level of Generative/Spontaneous Verbalization: Conversation Naming: Not tested Pragmatics: No impairment   Oral / Motor  Oral Motor/Sensory Function Overall Oral Motor/Sensory Function: Within functional limits Motor Speech Overall Motor Speech: Appears within functional limits for tasks assessed Intelligibility: Intelligible Motor Planning: Witnin functional limits   GO          Angela Nevin, MA, CCC-SLP Speech Therapy [facility]

## 2020-11-30 ENCOUNTER — Inpatient Hospital Stay (HOSPITAL_COMMUNITY): Payer: Medicare Other

## 2020-11-30 MED ORDER — APIXABAN 2.5 MG PO TABS
2.5000 mg | ORAL_TABLET | Freq: Two times a day (BID) | ORAL | Status: DC
  Administered 2020-11-30 – 2020-12-02 (×4): 2.5 mg via ORAL
  Filled 2020-11-30 (×5): qty 1

## 2020-11-30 NOTE — Discharge Instructions (Addendum)
Bridget [name], Bridget [name] were hospitalized due to a stroke that occurred in different areas of the back of your brain. Your stroke has affected your vision.   We believe you had your stroke due to your atrial fibrillation. This is an irregular rhythm that can cause blood clots to form in the heart. To help prevent another stroke please take your Eliquis, a blood thinner.   In addition to Eliquis, continue to take Atorvastatin 10 mg to prevent future strokes.   You are to follow up with neurology - expect a call regarding this appointment.   You are scheduled to follow up with Cardiology on June 14th. Appointment details are in this AVS.   Also follow up with your PCP - expect a call regarding this appointment, a referral to internal medicine has been placed   Please review more information about Eliquis below.   Information on my medicine - ELIQUIS (apixaban)  This medication education was reviewed with me or my healthcare representative as part of my discharge preparation.   Why was Eliquis prescribed for you? Eliquis was prescribed for you to reduce the risk of a blood clot forming that can cause a stroke if you have a medical condition called atrial fibrillation (a type of irregular heartbeat).  What do You need to know about Eliquis ? Take your Eliquis TWICE DAILY - one tablet in the morning and one tablet in the evening with or without food. If you have difficulty swallowing the tablet whole please discuss with your pharmacist how to take the medication safely.  Take Eliquis exactly as prescribed by your doctor and DO NOT stop taking Eliquis without talking to the doctor who prescribed the medication.  Stopping may increase your risk of developing a stroke.  Refill your prescription before you run out.  After discharge, you should have regular check-up appointments with your healthcare provider that is prescribing your Eliquis.  In the future your dose may need to be changed if your  kidney function or weight changes by a significant amount or as you get older.  What do you do if you miss a dose? If you miss a dose, take it as soon as you remember on the same day and resume taking twice daily.  Do not take more than one dose of ELIQUIS at the same time to make up a missed dose.  Important Safety Information A possible side effect of Eliquis is bleeding. You should call your healthcare provider right away if you experience any of the following: Bleeding from an injury or your nose that does not stop. Unusual colored urine (red or dark brown) or unusual colored stools (red or black). Unusual bruising for unknown reasons. A serious fall or if you hit your head (even if there is no bleeding).  Some medicines may interact with Eliquis and might increase your risk of bleeding or clotting while on Eliquis. To help avoid this, consult your healthcare provider or pharmacist prior to using any new prescription or non-prescription medications, including herbals, vitamins, non-steroidal anti-inflammatory drugs (NSAIDs) and supplements.  This website has more information on Eliquis (apixaban): http://www.eliquis.com/eliquis/home   ========================================  Ischemic Stroke   An ischemic stroke is the sudden death of brain tissue. Blood carries oxygen to all areas of the body. This type of stroke happens when your blood does not flow to your brain like normal. Your brain cannot get the oxygen it needs. This is an emergency. It must be treated right away. Symptoms of a stroke  usually happen all of a sudden. You may notice them when you wake up. They can include: Weakness or loss of feeling in your face, arm, or leg. This often happens on one side of the body. Trouble walking. Trouble moving your arms or legs. Loss of balance or coordination. Feeling confused. Trouble talking or understanding what people are saying. Slurred speech. Trouble seeing. Seeing two of  one object (double vision). Feeling dizzy. Feeling sick to your stomach (nauseous) and throwing up (vomiting). A very bad headache for no reason. Get help as soon as any of these problems start. This is important. Some treatments work better if they are given right away. These include: Aspirin. Medicines to control blood pressure. A shot (injection) of medicine to break up the blood clot. Treatments given in the blood vessel (artery) to take out the clot or break it up. Other treatments may include: Oxygen. Fluids given through an IV tube. Medicines to thin out your blood. Procedures to help your blood flow better.  What increases the risk? Certain things may make you more likely to have a stroke. Some of these are things that you can change, such as: Being very overweight (obesity). Smoking. Taking birth control pills. Not being active. Drinking too much alcohol. Using drugs. Other risk factors include: High blood pressure. High cholesterol. Diabetes. Heart disease. Being [place] American, Native [address], Hispanic, or [place] Native. Being over age 14. Family history of stroke. Having had blood clots, stroke, or warning stroke (transient ischemic attack, TIA) in the past. Sickle cell disease. Being a woman with a history of high blood pressure in pregnancy (preeclampsia). Migraine headache. Sleep apnea. Having an irregular heartbeat (atrial fibrillation). Long-term (chronic) diseases that cause soreness and swelling (inflammation). Disorders that affect how your blood clots.  Follow these instructions at home:  Medicines Take over-the-counter and prescription medicines only as told by your doctor. If you were told to take aspirin or another medicine to thin your blood, take it exactly as told by your doctor. Taking too much of the medicine can cause bleeding. If you do not take enough, it may not work as well. Know the side effects of your medicines. If you are taking  a blood thinner, make sure you: Hold pressure over any cuts for longer than usual. Tell your dentist and other doctors that you take this medicine. Avoid activities that may cause damage or injury to your body.  Eating and drinking Follow instructions from your doctor about what you cannot eat or drink. Eat healthy foods. If you have trouble with swallowing, do these things to avoid choking: Take small bites when eating. Eat foods that are soft or pureed.  Safety Follow instructions from your health care team about physical activity. Use a walker or cane as told by your doctor. Keep your home safe so you do not fall. This may include: Having experts look at your home to make sure it is safe. Putting grab bars in the bedroom and bathroom. Using raised toilets. Putting a seat in the shower.  General instructions Do not use any tobacco products. Examples of these are cigarettes, chewing tobacco, and e-cigarettes. If you need help quitting, ask your doctor. Limit how much alcohol you drink. This means no more than 1 drink a day for nonpregnant women and 2 drinks a day for men. One drink equals 12 oz of beer, 5 oz of wine, or 1 oz of hard liquor. If you need help to stop using drugs or alcohol, ask  your doctor to refer you to a program or specialist. Stay active. Exercise as told by your doctor. Keep all follow-up visits as told by your doctor. This is important.  Get help right away if:  You have any signs of a stroke. "BE FAST" is an easy way to remember the main warning signs: B - Balance. Signs are dizziness, sudden trouble walking, or loss of balance. E - Eyes. Signs are trouble seeing or a change in how you see. F - Face. Signs are sudden weakness or loss of feeling of the face, or the face or eyelid drooping on one side. A - Arms. Signs are weakness or loss of feeling in an arm. This happens suddenly and usually on one side of the body. S - Speech. Signs are sudden trouble  speaking, slurred speech, or trouble understanding what people say. T - Time. Time to call emergency services. Write down what time symptoms started. You have other signs of a stroke, such as: A sudden, very bad headache with no known cause. Feeling sick to your stomach (nausea). Throwing up (vomiting). Jerky movements you cannot control (seizure).  These symptoms may be an emergency. Do not wait to see if the symptoms will go away. Get medical help right away. Call your local emergency services (911 in the [place] Do not drive yourself to the hospital.  Summary An ischemic stroke is the sudden death of brain tissue. Symptoms of a stroke usually happen all of a sudden. You may notice them when you wake up. Get help if you have any warning signs of a stroke. This is important. Some treatments work better if they are given right away. This information is not intended to replace advice given to you by your health care provider. Make sure you discuss any questions you have with your health care provider. Document Revised: 11/08/2017 Document Reviewed: 08/26/2015 Elsevier Patient Education  2020 [employer].

## 2020-11-30 NOTE — Progress Notes (Signed)
Pt adamantly refused to do the MRI Brain. Pt also refused to get claustro meds.

## 2020-11-30 NOTE — Progress Notes (Signed)
Physical Therapy Treatment Patient Details Name: Bridget [name] MRN: [id number] DOB: 1925-10-03 Today's Date: 11/30/2020    History of Present Illness The pt is a 87 yo female presenting 6/16 due to acute onset blindness. Pt received IV tPA due to concern for stroke, imaging revealed bilateral scattered posterior circulation infarcts on 6/16, repeat MRI on 6/17 showing parenchymal hemorrhage in the medial left occipital lobe. PMH includes: HFpEF, CKD III, COPD, HTN, dementia, and afib.    PT Comments    Pt received in bed, very pleasant and willing to participate in therapy. She required supervision bed mobility, min guard assist transfers, and min guard assist ambulation 30' with RW. Pt reporting her legs felt weak, limiting ambulation to in room only. Pt performed LE exercises in recliner. Pt remained in recliner at end of session.    Follow Up Recommendations  Home health PT;Supervision/Assistance - 24 hour     Equipment Recommendations  3in1 (PT) (tub bench)    Recommendations for Other Services       Precautions / Restrictions Precautions Precautions: Fall;Other (comment) Precaution Comments: R and L visual deficits.  She attends/sees better to the L, but has a field cut to that side as well.    Mobility  Bed Mobility Overal bed mobility: Needs Assistance Bed Mobility: Supine to Sit     Supine to sit: Supervision;HOB elevated     General bed mobility comments: +rail, increased time to scoot to EOB    Transfers Overall transfer level: Needs assistance Equipment used: Rolling walker (2 wheeled) Transfers: Sit to/from Stand Sit to Stand: Supervision Stand pivot transfers: Min guard       General transfer comment: cues for sequencing, assist for safety  Ambulation/Gait Ambulation/Gait assistance: Min guard Gait Distance (Feet): 30 Feet Assistive device: Rolling walker (2 wheeled) Gait Pattern/deviations: Step-through pattern;Decreased stride length Gait  velocity: decreased   General Gait Details: Distance limited by LE weakness. Pt stating "my legs just feel weak. I can't walk very far right now."   Stairs             Wheelchair Mobility    Modified Rankin (Stroke Patients Only)       Balance Overall balance assessment: Needs assistance Sitting-balance support: Feet supported;No upper extremity supported Sitting balance-Leahy Scale: Good     Standing balance support: Bilateral upper extremity supported;During functional activity Standing balance-Leahy Scale: Poor Standing balance comment: reliant on BUE support.                            Cognition Arousal/Alertness: Awake/alert Behavior During Therapy: WFL for tasks assessed/performed Overall Cognitive Status: History of cognitive impairments - at baseline                                 General Comments: Disoriented to time and situation. Very pleasant and cooperative. Aware of confusion. Follows multi step commands inconsistently with increased time.      Exercises General Exercises - Lower Extremity Ankle Circles/Pumps: AROM;Both;10 reps;Seated Long Arc Quad: AROM;Right;Left;10 reps;Seated    General Comments        Pertinent Vitals/Pain Pain Assessment: No/denies pain    Home Living                      Prior Function            PT Goals (current goals can now  be found in the care plan section) Acute Rehab PT Goals Patient Stated Goal: home Progress towards PT goals: Progressing toward goals    Frequency    Min 4X/week      PT Plan Current plan remains appropriate    Co-evaluation              AM-PAC PT "6 Clicks" Mobility   Outcome Measure  Help needed turning from your back to your side while in a flat bed without using bedrails?: A Little Help needed moving from lying on your back to sitting on the side of a flat bed without using bedrails?: A Little Help needed moving to and from a bed to  a chair (including a wheelchair)?: A Little Help needed standing up from a chair using your arms (e.g., wheelchair or bedside chair)?: A Little Help needed to walk in hospital room?: A Little Help needed climbing 3-5 steps with a railing? : A Little 6 Click Score: 18    End of Session Equipment Utilized During Treatment: Gait belt Activity Tolerance: Patient tolerated treatment well Patient left: in chair;with call bell/phone within reach;with chair alarm set Nurse Communication: Mobility status PT Visit Diagnosis: Unsteadiness on feet (R26.81);Other abnormalities of gait and mobility (R26.89);Muscle weakness (generalized) (M62.81)     Time: 6967-8938 PT Time Calculation (min) (ACUTE ONLY): 14 min  Charges:  $Gait Training: 8-22 mins                     Aida Raider, PT  Office # [phone] Pager [phone]    Ilda Foil 11/30/2020, 9:57 AM

## 2020-11-30 NOTE — Progress Notes (Signed)
CCMD called that pt had some pauses up to 0.33 sec, on call MD paged, pt sleeping at the moment

## 2020-11-30 NOTE — Care Management Important Message (Signed)
Important Message  Patient Details  Name: Bridget [name] MRN: [id number] Date of Birth: [birth year]   Medicare Important Message Given:  Yes     Dorena Bodo 11/30/2020, 3:42 PM

## 2020-11-30 NOTE — Progress Notes (Signed)
ANTICOAGULATION CONSULT NOTE - Initial Consult  Pharmacy Consult for apixaban Indication: atrial fibrillation  No Known Allergies  Patient Measurements: Height: 5\' 4"  (162.6 cm) Weight: 63.8 kg (140 lb 10.5 oz) IBW/kg (Calculated) : 54.7    Vital Signs: Temp: 97.7 F (36.5 C) (06/20 1245) Temp Source: Oral (06/20 1245) BP: 104/78 (06/20 1605) Pulse Rate: 50 (06/20 1605)  Labs: No results for input(s): HGB, HCT, PLT, APTT, LABPROT, INR, HEPARINUNFRC, HEPRLOWMOCWT, CREATININE, CKTOTAL, CKMB, TROPONINIHS in the last 72 hours.  Estimated Creatinine Clearance: 34.5 mL/min (by C-G formula based on SCr of 0.88 mg/dL).   Medical History: Past Medical History:  Diagnosis Date   (HFpEF) heart failure with preserved ejection fraction (HCC)    a. 08/2020 Echo: EF 60-65%, no rwma, Gr1 DD, nl RV fxn. Mildly dil RA. Mild MR/AI.   Asthma    CKD (chronic kidney disease), stage III (HCC)    COPD (chronic obstructive pulmonary disease) (HCC)    Falls    Hypertension    Mild Dementia (HCC)    Permanent atrial fibrillation (HCC)    a. CHA2DS2VASc = 5-->no OAC 2/2 unsteady gait/falls.   Unsteady gait       Assessment: 87 yo W admitted with CVA in the setting of afib without anticoagulation. Per Stroke team discussion with family, would like to try apixaban. Pharmacy consulted for apixaban.  Given age > 80, weight near 60kg, will start 2.5mg . H/H, plt stable.  Goal of Therapy:   Monitor platelets by anticoagulation protocol: Yes   Plan:  Start apixaban 2.5mg  BID  Monitor for signs/symptoms of bleeding    83, PharmD, BCPS, BCCP Clinical Pharmacist  Please check AMION for all [facility] Pharmacy phone numbers After 10:00 PM, call Main Pharmacy [phone]

## 2020-11-30 NOTE — TOC Initial Note (Signed)
Transition of Care [facility]) - Initial/Assessment Note    Patient Details  Name: Bridget [name] MRN: [id number] Date of Birth: 06-Aug-1925  Transition of Care [facility]) CM/SW Contact:    Kermit Balo, RN Phone Number: 11/30/2020, 2:08 PM  Clinical Narrative:                 Patient confused so CM reached out to patients daughter. She states that the patient will have 24 hour supervision at home. They are also placing cameras so they can monitor her at night.  HH resumption through [facility] and Kandee Keen is aware.  3 in 1 for home ordered through [employer] and will be delivered to the room. Pt will have transport home when d/ced.  Expected Discharge Plan: Home w Home Health Services Barriers to Discharge: Continued Medical Work up   Patient Goals and CMS Choice     Choice offered to / list presented to : Adult Children  Expected Discharge Plan and Services Expected Discharge Plan: Home w Home Health Services   Discharge Planning Services: CM Consult Post Acute Care Choice: Home Health, Durable Medical Equipment Living arrangements for the past 2 months: Single Family Home                 DME Arranged: 3-N-1 DME Agency: [employer] Date DME Agency Contacted: 11/30/20   Representative spoke with at DME Agency: Velna Hatchet HH Arranged: PT, OT, Nurse's Aide HH Agency: [facility] Date [facility] Agency Contacted: 11/30/20   Representative spoke with at [facility] Agency: Kandee Keen  Prior Living Arrangements/Services Living arrangements for the past 2 months: Single Family Home Lives with:: Adult Children Patient language and need for interpreter reviewed:: Yes Do you feel safe going back to the place where you live?: Yes      Need for Family Participation in Patient Care: Yes (Comment) Care giver support system in place?: Yes (comment)   Criminal Activity/Legal Involvement Pertinent to Current Situation/Hospitalization: No - Comment as needed  Activities of Daily Living Home Assistive  Devices/Equipment: Cane (specify quad or straight) (four point) ADL Screening (condition at time of admission) Patient's cognitive ability adequate to safely complete daily activities?: Yes (doesnt prepare food) Is the patient deaf or have difficulty hearing?: Yes Does the patient have difficulty seeing, even when wearing glasses/contacts?: Yes (glasses) Does the patient have difficulty concentrating, remembering, or making decisions?: Yes Patient able to express need for assistance with ADLs?: Yes (intermittent) Does the patient have difficulty dressing or bathing?: Yes (difficulty getting into tub without assistance) Independently performs ADLs?: Yes (appropriate for developmental age) Does the patient have difficulty walking or climbing stairs?: Yes Weakness of Legs: Both Weakness of Arms/Hands: None  Permission Sought/Granted                  Emotional Assessment Appearance:: Appears stated age     Orientation: : Oriented to Self   Psych Involvement: No (comment)  Admission diagnosis:  Stroke (cerebrum) [facility]) [I63.9] Patient Active Problem List   Diagnosis Date Noted   Ischemic stroke (HCC) 11/26/2020   Stroke (cerebrum) (HCC) 11/26/2020   Acute on chronic diastolic (congestive) heart failure (HCC) 11/13/2020   CKD (chronic kidney disease), stage II    Chronic diastolic CHF (congestive heart failure) (HCC)    Acute respiratory failure (HCC) 11/07/2020   Elevated troponin 11/07/2020   COPD with acute exacerbation (HCC) 08/31/2020   Atrial fibrillation, chronic (HCC) 08/31/2020   Acute CHF (congestive heart failure) (HCC) 08/31/2020   CKD (chronic  kidney disease), stage III (HCC) 08/31/2020   Essential hypertension 07/06/2020   Acute lower UTI 07/06/2020   Acute respiratory disease due to COVID-19 virus 07/05/2020   PCP:  Glori Bickers, MD Pharmacy:   [facility] [address] [place] - [address] [place] [address] Phone:  [phone] Fax: [phone]  [id number] [place] [place] - [address] [place] [address] Phone: [phone] Fax: [phone]  [id number] [place] [facility] - [address] [place] [address] Phone: [phone] Fax: [phone]     Social Determinants of Health (SDOH) Interventions    Readmission Risk Interventions No flowsheet data found.

## 2020-11-30 NOTE — Progress Notes (Signed)
STROKE TEAM PROGRESS NOTE   INTERVAL HISTORY She is sitting in the bed.  She has no complaints. No acute events.  Social worker had discussed with daughter and family and patient want to go home.  Family awaiting installation of cameras in her home and arrangement for 24-hour care plan to take her home the next few days. Vital signs are stable.  Neurological exam is unchanged. PERTINENT IMAGING  CT head 18Jun2022 Left PCA territory infarct with stable confluent petechial hemorrhage since the MRI yesterday. Stable confluent medial Right PCA territory and scattered bilateral cerebellar infarcts since yesterday without new hemorrhagic transformation. No significant intracranial mass effect. No new intracranial abnormality.  MR brain 17Jun2022 Stable size of acute/subacute infarcts involving the PCA distributions bilaterally. No new infarcts Interval parenchymal hemorrhage in the medial left occipital lobe infarct. Stable punctate nonhemorrhagic infarct in the left frontal lobe white matter. Remote lacunar infarcts of the cerebellum bilaterally. Moderate diffuse white matter disease likely reflects the sequela of chronic microvascular ischemia Diffuse sinus disease. Right mastoid effusion. No obstructing nasopharyngeal lesion is evident.  MR brain 16Jun2022 Acute posterior circulation infarcts involving bilateral occipital lobes, right posteromedial temporal lobe, and cerebellum. Punctate acute infarct of the left frontal white matter.   CTA head/neck with perfusion study 16Jun2022 Suboptimal valuation due to poor contrast bolus timing. No large vessel occlusion. Specifically, bilateral P1 and P2 PCA segments are patent. Perfusion imaging demonstrates no evidence of core infarction and 55 mL penumbra involving the bilateral occipital lobes with some parietal involvement. Again, bolus is suboptimal but the area of involvement fits with reported symptoms. Plaque at the ICA origins causes less  than 50% stenosis.  CT head 16Jun2022 No acute intracranial findings by CT. Mild chronic microvascular ischemic change and cerebral volume loss. Paranasal sinus disease with air-fluid levels. Correlate for signs and symptoms of acute sinusitis.  Vitals:   11/29/20 1940 11/30/20 0120 11/30/20 0400 11/30/20 1245  BP: 104/77 105/74 (!) 103/58 111/83  Pulse: 66 (!) 58 (!) 53 (!) 59  Resp: 16 20 19 20   Temp: 97.8 F (36.6 C) 98.1 F (36.7 C) 97.9 F (36.6 C) 97.7 F (36.5 C)  TempSrc: Oral Oral Oral Oral  SpO2: 98% 100% 97% 99%  Weight:   63.8 kg   Height:        CBC:  Recent Labs  Lab 11/26/20 1050  WBC 13.5*  NEUTROABS 11.4*  HGB 17.6*  HCT 52.9*  MCV 93.6  PLT 174    Basic Metabolic Panel:  Recent Labs  Lab 11/26/20 1050  NA 138  K 3.8  CL 95*  CO2 34*  GLUCOSE 123*  BUN 24*  CREATININE 0.88  CALCIUM 9.1    Lipid Panel:  Recent Labs  Lab 11/27/20 0436  CHOL 120  TRIG 64  HDL 39*  CHOLHDL 3.1  VLDL 13  LDLCALC 68   HgbA1c:  Lab Results  Component Value Date   HGBA1C 6.3 (H) 11/27/2020   Urine Drug Screen: No results found for: LABOPIA, COCAINSCRNUR, LABBENZ, AMPHETMU, THCU, LABBARB  Alcohol Level No results found for: ETH   EXAMINATION HEENT-  Normocephalic, no lesions, without obvious abnormality.  Normal external eye and conjunctiva.  Wears glasses at baseline Cardiovascular- perfusing well Lungs-breathing comfortably on room air. Some labored breathing and wheezing during exam that resolved with rest. Abdomen- Soft Extremities-No obvious abnormality Skin-warm and dry, intact  Neurologic Exam: General: NAD Mental Status: Alert, oriented to self and general location, not to month/year/president, thought content appropriate.  Speech fluent without evidence of aphasia, minimal dysarthria.  Able to follow commands without difficulty. Cranial Nerves: II:  Right hemianopia, pupils equal, round, reactive to light III,IV, VI: ptosis not  present, extra-ocular motions intact bilaterally V,VII: smile symmetric, facial light touch sensation normal bilaterally VIII: hearing normal bilaterally IX,X: uvula rises symmetrically XI: bilateral shoulder shrug antigravity XII: midline tongue extension without atrophy or fasciculations Motor: Moves all extremities antigravity. Tone and bulk appropriate for age Sensory: Light touch equal and intact throughout, bilaterally Cerebellar: Normal finger-to-nose Gait: Deferred  ASSESSMENT/PLAN Bridget [name] is a 87 y.o. female with past medical history significant for atrial fibrillation not on anticoagulation secondary to perceived fall risk, heart failure with preserved EF, chronic kidney disease, mild dementia (baseline disoriented to month/year but ambulatory with walker and able to perform ADLs, make coffee for herself etc.). On 16Jun2022, family discovered Bridget [name] unable to see around 9:30AM. She was brought to [facility] for evaluation.   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 tPA was given due to the severity of her visual field deficit and excellent baseline functional status despite her memory impairment. Imaging has been stable since initial insult and Ms. Jenning condition has subjectively improved, although she does have a complete right hemianopia. Moving forward with stroke workup. - Labs complete:     - HgbA1c: 6.3; goal <7    - Lipids: LDL 68; goal <70, continue lipitor 10mg  - Repeat MRI ordered - TTE normal ejection fraction.  Dilated inferior vena cava - BP control: Continue bisoprolol 5mg  - DAPT: Continue 81mg  ASA and 10mg  lipitor  # Dementia - SLP consulted for cognitive assessment -  Social work consulted to discuss discharge options with patient's family considering baseline dementia in the setting of new-onset visual field cut  #CHF - Continue Lasix  # COPD - Duoneb available PRN  # afib - Currently DAPT - TTE ejection fraction 65 to 70%.  Dilated inferior vena cava - On telemetry - Suggest outpatient cardiology follow-up for continued management  # Dispo - Plan to keep inpatient over the weekend  Hospital day # 4   Continue ongoing management.  I had a long discussion with her daughter over the phone about goals of care and patient and family are clearly would like to take her home.  They are installing cameras in her home and are interviewing caregivers can be with her.  I also discussed with her risk benefit of anticoagulation with Eliquis and she understands and wants to stop try Eliquis as patient was only on aspirin at this disabling stroke.  They understand there is a small risk of intracerebral hemorrhage as well as bruising and bleeding due to her limited vision increased fall risk.  I have spent a total of  25  minutes with the patient reviewing hospital notes,  test results, labs and examining the patient as well as establishing an assessment and plan that was discussed personally with the patient.  > 50% of time was spent in direct patient care.    , MD 11/30/2020 3:53 PM

## 2020-11-30 NOTE — Progress Notes (Signed)
Occupational Therapy Treatment Patient Details Name: Bridget [name] MRN: [id number] DOB: 1925/08/20 Today's Date: 11/30/2020    History of present illness The pt is a 87 yo female presenting 6/16 due to acute onset blindness. Pt received IV tPA due to concern for stroke, imaging revealed bilateral scattered posterior circulation infarcts on 6/16, repeat MRI on 6/17 showing parenchymal hemorrhage in the medial left occipital lobe. PMH includes: HFpEF, CKD III, COPD, HTN, dementia, and afib.   OT comments  Pt progressing toward OT goals. Performing functional mobility, grooming, and toilet hygiene with Min Guard for safety. Pt with impaired peripheral visual fields on R and L. Pt requiring max VC's for compensatory techniques to scan environment throughout session. Pt daughter on phone and educated to enforce compensatory techniques and safety upon return home. Continue to recommend discharge home with [facility] and will follow acutely to optimize safety and independence in ADLs.    Follow Up Recommendations  Home health OT;Supervision/Assistance - 24 hour    Equipment Recommendations  3 in 1 bedside commode    Recommendations for Other Services      Precautions / Restrictions Precautions Precautions: Fall;Other (comment) Precaution Comments: R and L visual deficits. Restrictions Weight Bearing Restrictions: No       Mobility Bed Mobility Overal bed mobility: Needs Assistance Bed Mobility: Supine to Sit     Supine to sit: Supervision;HOB elevated     General bed mobility comments: Pt in recliner upon arrival.    Transfers Overall transfer level: Needs assistance Equipment used: Rolling walker (2 wheeled) Transfers: Sit to/from Stand Sit to Stand: Min guard Stand pivot transfers: Min guard       General transfer comment: cues for sequencing, assist for safety    Balance Overall balance assessment: Needs assistance Sitting-balance support: Feet supported;No upper extremity  supported Sitting balance-Leahy Scale: Good Sitting balance - Comments: no UE support, no LOB   Standing balance support: Bilateral upper extremity supported;During functional activity;Single extremity supported Standing balance-Leahy Scale: Fair Standing balance comment: Using RW for functional mobility. Completing oral care and toilet hygiene with single UE support on sink.                           ADL either performed or assessed with clinical judgement   ADL Overall ADL's : Needs assistance/impaired     Grooming: Wash/dry hands;Oral care;Min guard;Cueing for compensatory techniques;Sitting Grooming Details (indicate cue type and reason): cueing for visual compensatory techniques to locate items needed for oral care and hand washing.                 Toilet Transfer: Min guard;Ambulation;RW;BSC Toilet Transfer Details (indicate cue type and reason): bowel urgency x2, requiring use of BSC prior to and during oral care. Pt min guard for safety for transfer. Pt unable to recall first bowel movement. Toileting- Architect and Hygiene: Min guard;Sit to/from stand Toileting - Clothing Manipulation Details (indicate cue type and reason): Pt requiring min guard for safety and requiring cues to complete toiletb hygiene.     Functional mobility during ADLs: Min guard;Rolling walker General ADL Comments: Pt requiring min Guard for safety during functional mobility and presenting with decreased awareness of objects in environment, and requiring max verbal cues to scan during oral care at sink.     Vision   Vision Assessment?: Yes Visual Fields: Left visual field deficit;Right visual field deficit Additional Comments: Pt requiring cues throughout sesssion to use compensatory techniques to  scan environment. Pt bumping walker into bed rail when walking around bed. Unable to locate tooth brush and tooth paste (outside midline) without cues for scanning.   Perception      Praxis      Cognition Arousal/Alertness: Awake/alert Behavior During Therapy: WFL for tasks assessed/performed Overall Cognitive Status: History of cognitive impairments - at baseline                                 General Comments: Disoriented to time and situation. Very pleasant and cooperative. Follows one step commands with increased time and requires verbal cues to transition from one task to the next. Demonstrating poor recall compensatory strategies for vision to scan her environment. Unable to remember if she brushed her teeth in same session. Pt attempting to use shampoo as toothpaste and able to locate toothpaste and read label with cues for scanning sink to locate toothpaste.        Exercises   Shoulder Instructions       General Comments Pt pleasant and conversational throughout. With decreased STM and difficulty recalling whether ADLs were completed. Phone call with pt daughter. Educated to enforce compensatory techniques for vision and cognition, recommended DME for safe ADL participation at home.    Pertinent Vitals/ Pain       Pain Assessment: No/denies pain  Home Living                                          Prior Functioning/Environment              Frequency  Min 2X/week        Progress Toward Goals  OT Goals(current goals can now be found in the care plan section)     Acute Rehab OT Goals Patient Stated Goal: home OT Goal Formulation: With patient Time For Goal Achievement: 12/12/20 Potential to Achieve Goals: Good ADL Goals Pt Will Perform Grooming: with supervision;sitting;standing Pt Will Perform Upper Body Bathing: with set-up;sitting Pt Will Perform Lower Body Bathing: with supervision;sitting/lateral leans Pt Will Perform Upper Body Dressing: with set-up;sitting;standing Pt Will Perform Lower Body Dressing: with supervision;sitting/lateral leans Pt Will Transfer to Toilet: with  set-up;ambulating;regular height toilet Pt Will Perform Toileting - Clothing Manipulation and hygiene: with modified independence;sit to/from stand Pt/caregiver will Perform Home Exercise Program: Increased strength;Both right and left upper extremity;With Supervision  Plan Discharge plan remains appropriate    Co-evaluation                 AM-PAC OT "6 Clicks" Daily Activity     Outcome Measure   Help from another person eating meals?: A Little Help from another person taking care of personal grooming?: A Little Help from another person toileting, which includes using toliet, bedpan, or urinal?: A Little Help from another person bathing (including washing, rinsing, drying)?: A Lot Help from another person to put on and taking off regular upper body clothing?: A Little Help from another person to put on and taking off regular lower body clothing?: A Lot 6 Click Score: 16    End of Session Equipment Utilized During Treatment: Rolling walker;Gait belt  OT Visit Diagnosis: Unsteadiness on feet (R26.81);Muscle weakness (generalized) (M62.81);Other symptoms and signs involving cognitive function   Activity Tolerance Patient tolerated treatment well   Patient Left in chair;with call bell/phone within  reach;with chair alarm set   Nurse Communication Mobility status        Time: [id number] OT Time Calculation (min): 41 min  Charges: OT General Charges $OT Visit: 1 Visit OT Treatments $Self Care/Home Management : 38-52 mins  Ladene Artist, OTDS    Ladene Artist 11/30/2020, 11:24 AM

## 2020-12-01 ENCOUNTER — Other Ambulatory Visit (HOSPITAL_COMMUNITY): Payer: Self-pay

## 2020-12-01 NOTE — Progress Notes (Addendum)
STROKE TEAM PROGRESS NOTE   INTERVAL HISTORY   She is sitting up in the chair by the bed. No family at bedside. Neurological exam is stable.  Spoke with patients [name] on the phone; she states that the cameras they need to set up in the house have not arrived; are scheduled to arrive later today. Will allow them more time to receive/set up cameras before discharging Bridget.  Neurological exam is unchanged.  Vital signs are stable.  PERTINENT IMAGING  CT head 28 Nov 2020 Left PCA territory infarct with stable confluent petechial hemorrhage since the MRI yesterday. Stable confluent medial Right PCA territory and scattered bilateral cerebellar infarcts since yesterday without new hemorrhagic transformation. No significant intracranial mass effect. No new intracranial abnormality.  MR brain 27 Nov 2020 Stable size of acute/subacute infarcts involving the PCA distributions bilaterally. No new infarcts Interval parenchymal hemorrhage in the medial left occipital lobe infarct. Stable punctate nonhemorrhagic infarct in the left frontal lobe white matter. Remote lacunar infarcts of the cerebellum bilaterally. Moderate diffuse white matter disease likely reflects the sequela of chronic microvascular ischemia Diffuse sinus disease. Right mastoid effusion. No obstructing nasopharyngeal lesion is evident.  MR brain 26 Nov 2020 Acute posterior circulation infarcts involving bilateral occipital lobes, right posteromedial temporal lobe, and cerebellum. Punctate acute infarct of the left frontal white matter.   CTA head/neck with perfusion study 26 Nov 2020 Suboptimal valuation due to poor contrast bolus timing. No large vessel occlusion. Specifically, bilateral P1 and P2 PCA segments are patent. Perfusion imaging demonstrates no evidence of core infarction and 55 mL penumbra involving the bilateral occipital lobes with some parietal involvement. Again, bolus is suboptimal but the area of involvement  fits with reported symptoms. Plaque at the ICA origins causes less than 50% stenosis.  CT head 16Jun2022 No acute intracranial findings by CT. Mild chronic microvascular ischemic change and cerebral volume loss. Paranasal sinus disease with air-fluid levels. Correlate for signs and symptoms of acute sinusitis.  Vitals:   12/01/20 0400 12/01/20 0838 12/01/20 1212 12/01/20 1601  BP: 138/84 96/63 115/84 105/67  Pulse: 68 64 73 (!) 56  Resp:  18 18 18   Temp: 97.9 F (36.6 C) 97.6 F (36.4 C) 97.8 F (36.6 C) 97.7 F (36.5 C)  TempSrc: Oral Oral Oral Oral  SpO2: 94% 99% 93% 100%  Weight:      Height:        CBC:  Recent Labs  Lab 11/26/20 1050  WBC 13.5*  NEUTROABS 11.4*  HGB 17.6*  HCT 52.9*  MCV 93.6  PLT 174    Basic Metabolic Panel:  Recent Labs  Lab 11/26/20 1050  NA 138  K 3.8  CL 95*  CO2 34*  GLUCOSE 123*  BUN 24*  CREATININE 0.88  CALCIUM 9.1    Lipid Panel:  Recent Labs  Lab 11/27/20 0436  CHOL 120  TRIG 64  HDL 39*  CHOLHDL 3.1  VLDL 13  LDLCALC 68   HgbA1c:  Lab Results  Component Value Date   HGBA1C 6.3 (H) 11/27/2020   Urine Drug Screen: No results found for: LABOPIA, COCAINSCRNUR, LABBENZ, AMPHETMU, THCU, LABBARB  Alcohol Level No results found for: ETH   EXAMINATION HEENT-  Normocephalic, no lesions, without obvious abnormality.  Normal external eye and conjunctiva.  Wears glasses at baseline Cardiovascular- perfusing well Lungs-breathing comfortably on room air. Some labored breathing and wheezing during exam that resolved with rest. Abdomen- Soft Extremities-No obvious abnormality Skin-warm and dry, intact  Neurologic Exam: General:  NAD Mental Status: Alert, oriented to self and general location, not to month/year/president, thought content appropriate.  Speech fluent without evidence of aphasia, minimal dysarthria.  Able to follow commands without difficulty. Cranial Nerves: II:  Right hemianopia, pupils equal, round,  reactive to light III,IV, VI: ptosis not present, extra-ocular motions intact bilaterally V,VII: smile symmetric, facial light touch sensation normal bilaterally VIII: hearing normal bilaterally IX,X: uvula rises symmetrically XI: bilateral shoulder shrug antigravity XII: midline tongue extension without atrophy or fasciculations Motor: Moves all extremities antigravity. Tone and bulk appropriate for age Sensory: Light touch equal and intact throughout, bilaterally Cerebellar: Normal finger-to-nose Gait: Deferred  ASSESSMENT/PLAN Bridget [name] is a 87 y.o. female with past medical history significant for atrial fibrillation not on anticoagulation secondary to perceived fall risk, heart failure with preserved EF, chronic kidney disease, mild dementia (baseline disoriented to month/year but ambulatory with walker and able to perform ADLs, make coffee for herself etc.). On 16Jun2022, family discovered Bridget [name] unable to see around 9:30AM. She was brought to [facility] for evaluation.   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 tPA was given due to the severity of Bridget visual field deficit and excellent baseline functional status despite Bridget memory impairment. Imaging has been stable since initial insult and Bridget [name] condition has subjectively improved, although she does have a complete right hemianopia. Moving forward with stroke workup. - Labs complete:     - HgbA1c: 6.3; goal <7    - Lipids: LDL 68; goal <70, continue lipitor 10mg  - Repeat MRI ordered - TTE normal ejection fraction.  Dilated inferior vena cava - BP control: Continue bisoprolol 5mg  - DAPT: Continue 81mg  ASA and 10mg  lipitor  # Dementia - SLP  consulted for cognitive assessment - Social work consulted to discuss discharge options with patient's family considering baseline dementia in the setting of new-onset visual field cut  #CHF - Continue Lasix  # COPD - Duoneb available PRN  # afib - Currently DAPT - TTE ejection fraction 65 to 70%.  Dilated inferior vena cava - On telemetry - Suggest outpatient cardiology follow-up for continued management  # Dispo - Plan to keep inpatient pending family readiness   Hospital day # 5  , NP  Stroke Service Nurse Practitioner   Continue ongoing management. Stroke NP had a long discussion with Bridget [name] over the phone about goals of care and patient and family are clearly would like to take Bridget home.  They are installing cameras in Bridget home and are interviewing caregivers can be with Bridget.  I also discussed with Bridget risk benefit of anticoagulation with Eliquis and she understands and wants to stop try Eliquis as patient was only on aspirin at this disabling stroke.  They understand there is a small risk of intracerebral hemorrhage as well as bruising and bleeding due to Bridget limited vision increased fall risk.  I have spent a total of  25  minutes with the patient reviewing hospital notes,  test results, labs and examining the patient as well as establishing an assessment and plan that was discussed personally with the patient.  > 50% of time was spent in direct patient care. Continue ongoing care.  Discharge home when family is ready  next couple of days   Delia Heady, MD 12/01/2020 4:38 PM

## 2020-12-01 NOTE — Discharge Summary (Addendum)
Stroke Discharge Summary  Patient ID: Bridget [name]       MRN: [id number]      DOB: [birth year]  Date of Admission: 11/26/2020 Date of Discharge: 12/02/2020  Attending Physician:Dr. Pearlean [name]  Consultant(s): Cardiology  Patient's PCP:  Bridget Bickers, MD  DISCHARGE DIAGNOSIS: Active Problems:   Stroke (cerebrum) [facility]) :Multifocal Posterior Circulation Stroke in the setting of Atrial Fibrillation s/p iv TPA treatment   Allergies as of 12/02/2020   No Known Allergies      Medication List     STOP taking these medications    aspirin 81 MG chewable tablet   aspirin EC 81 MG tablet   mometasone-formoterol 100-5 MCG/ACT Aero Commonly known as: DULERA   Spacer/Aero-Holding [employer]       TAKE these medications    albuterol 108 (90 Base) MCG/ACT inhaler Commonly known as: VENTOLIN HFA Inhale 2 puffs into the lungs every 6 (six) hours as needed for wheezing or shortness of breath.   apixaban 2.5 MG Tabs tablet Commonly known as: ELIQUIS Take 1 tablet (2.5 mg total) by mouth 2 (two) times daily.   atorvastatin 10 MG tablet Commonly known as: LIPITOR Take 1 tablet (10 mg total) by mouth at bedtime.   bisoprolol 5 MG tablet Commonly known as: ZEBETA Take 1 tablet (5 mg total) by mouth daily.   furosemide 40 MG tablet Commonly known as: LASIX Take 1 tablet (40 mg total) by mouth daily.       ASK your doctor about these medications    ipratropium-albuterol 0.5-2.5 (3) MG/3ML Soln Commonly known as: DUONEB Take 3 mLs by nebulization 4 (four) times daily.               Durable Medical Equipment  (From admission, onward)           Start     Ordered   11/30/20 1352  For home use only DME 3 n 1  Once        11/30/20 1351           PERTINENT IMAGING AND LABS  11/26/20 CT Head  1. No acute intracranial findings by CT. 2. Mild chronic microvascular ischemic change and cerebral volume loss. 3. Paranasal sinus disease with air-fluid levels.  Correlate for signs and symptoms of acute sinusitis.  11/26/20 CT Angio Head Neck W WO Contrast   HEAD  Anterior circulation: Intracranial internal carotid arteries are patent with calcified plaque causing up to mild to moderate stenosis at the level of the clinoids. Anterior and middle cerebral arteries are patent.   Posterior circulation: Intracranial vertebral arteries are patent with calcified plaque causing mild stenosis on the right. Basilar artery is patent. Bilateral PICA origins are patent. Patent left AICA origin noted. Superior cerebellar artery origins are patent. Bilateral P1 and P2 PCA segments are patent. More distal evaluation is limited.  NECK Aortic arch: Mixed plaque along the aortic arch and patent great vessel origins. There is no high-grade stenosis of the proximal subclavian arteries.   Right carotid system: Patent. Calcified plaque at the bifurcation and proximal internal carotid causing less than 50% stenosis.   Left carotid system: Patent. Calcified plaque at the bifurcation and proximal internal carotid causing less than 50% stenosis.   Vertebral arteries: Patent and codominant. Plaque is present at the left vertebral origin without high-grade stenosis.  11/29/20 Echo w/Bubble Study   1. Left ventricular ejection fraction, by estimation, is 65 to 70%. The left ventricle has normal function. The left ventricle  has no regional wall motion abnormalities. There is mild left ventricular hypertrophy. Left ventricular diastolic function could not be evaluated.   2. Right ventricular systolic function is normal. The right ventricular size is normal. There is moderately elevated pulmonary artery systolic pressure.   3. Left atrial size was moderately dilated.   4. Right atrial size was mildly dilated.   5. The mitral valve is abnormal. Moderate mitral valve regurgitation.There is mild late systolic prolapse of both leaflets of the mitral valve.   6. The aortic valve is  tricuspid. Aortic valve regurgitation is mild. Mild aortic valve sclerosis is present, with no evidence of aortic valve stenosis. Aortic regurgitation PHT measures 520 msec.   7. Rouleaux formation in the IVC suggestive of sluggish venous flow/venous stasis. The inferior vena cava is dilated in size with <50% respiratory variability, suggesting right atrial pressure of 15 mmHg.   11/26/20 MRI Brain WO Contrast  Acute posterior circulation infarcts involving bilateral occipital lobes, right posteromedial temporal lobe, and cerebellum. Punctate\ acute infarct of the left frontal white matter.  HISTORY OF PRESENT ILLNESS Bridget [name] is an 87 y.o. female with atrial fibrillation, not on anticoagulation, who presents in transfer from [facility] after receiving tPA there for acute onset of cortical blindness suspected to be secondary to stroke occurring overnight during sleep. Wake up stroke protocol was used to determine eligibility for IV tPA, which was administered. She was transferred to [facility] due to a lack of ICU beds at [facility].    Per Dr. Rollene Fare consult note from today: "Bridget [name] is a 87 y.o. female with a past medical history significant for atrial fibrillation not on anticoagulation secondary to perceived fall risk, heart failure with preserved EF, chronic kidney disease, mild dementia (baseline disoriented to month/age but ambulatory with walker and able to perform ADLs, make coffee for herself etc.). She was recently hospitalized from 6/3-6/8 for acute decompensated heart failure and COPD exacerbation.  Daughter at bedside reports she has been doing very well since discharge other than some low energy for the past week which they were planning to treat conservatively with increased hydration.  She was last seen normal by family at 10:30 PM.  This morning they discovered her at 9:30 AM unable to see.  While the patient reports she was able to see when she initially woke and could see until about 9 AM,  given her baseline disorientation to time she was not felt to be a reliable historian. Advanced imaging was obtained which did demonstrate no large vessel occlusion however there was diffusion/FLAIR mismatch.  Given her baseline excellent functional status despite her memory impairment as well as the severity of her visual field deficit, in discussion with family the decision was made to treat with tPA, recognizing this is an off label use though supported by some clinical trial data she is at the edge of a treatment window and therefore relatively higher risk for complication."  DISCHARGE EXAM Constitutional: Frail Caucasian female sitting in chair beside the bed, in NAD  Respiratory: Unlabored respirations   Neurological examination:  MS: Oriented to self only, difficulty following complex commands like showing two fingers, giving a thumbs up, able to do simple commands like lifting her arms and legs, closing eyes  Speech: Fluent with repetition and naming intact CN: EOMI, Right Homonymous Hemianopsia, Face symmetric, Tongue midline Motor: Normal bulk and tone. No drift. Antigravity throughout  Coordination: Intact FNF  Sensation: Intact to light touch throughout  Gait: Deferred due to  confusion    ASSESSMENT AND PLAN Sherry Blackard is a 87 year old female w/pmh of atrial fibrillation not on anticoagulation secondary to perceived fall risk, heart failure with preserved EF, chronic kidney disease, mild dementia who presents with vision loss, found to have multifocal posterior circulation stroke. She received IVTPA, did not qualify for thrombectomy due to lack of LVO.   #Multifocal Posterior Circulation Stroke in the setting of Atrial Fibrillation  She presents with the sx described above and underwent stroke work up this admission. Initial CTH w/NAICP. CTA Head and Neck did not show significant stenosis to anterior or posterior circulation. MRI Brain revealed acute posterior circulation infarcts  involving bilateral occipital lobes, right posteromedial temporal lobe, and cerebellum. Punctate acute infarct of the left frontal white matter. Stroke labs w/LDL 68, hemoglobin A1C 6.3. Stroke etiology is felt to be in the setting of atrial fibrillation for which she was not on anticoagulation. Anticoagulation was started this admission after having a risk/benefit discussion with family. For secondary stroke prevention she is to continue Eliquis 2.5 mg BID + Atorvastatin 10 mg at discharge. She will follow up with stroke clinic; referral placed at discharge. PT and OT saw her this admission and recommended outpatient PT/OT with 24/7 supervision. Patients daughter and son in law are having her stay with them to be able to provide 24/7 supervision.   #Atrial Fibrillation  #CHF  Started on anticoagulation with Eliquis 2.5 mg BID this admission for atrial fibrillation. For rate control she is on Bisoprolol, rate has been controlled this admission. For HF management will continue Furosemide 40 mg QD.  Cardiology has arranged for her to follow up with them on June 14th.   #Stroke Dysphagia Screening Passed evaluation with ST this hospitalization and was recommended to have a regular diet with thin liquids   #Stroke Diabetes Screening  A1C is 6.3, in the prediabetic range. She is to follow up with PCP for prediabetes management. Ambulatory referral placed to internal medicine at discharge as she has not established with a PCP here per daughter [name].   34 minutes were spent preparing discharge.  Stark Jock, NP  Stroke Service Nurse Practitioner  Patient seen and discussed with attending physician Dr. Pearlean [name]   I have personally obtained history,examined this patient, reviewed notes, independently viewed imaging studies, participated in medical decision making and plan of care.ROS completed by me personally and pertinent positives fully documented  I have made any additions or clarifications directly to  the above note. Agree with note above.    Delia Heady, MD Medical Director [facility] Pager: [phone] 12/02/2020 4:42 PM

## 2020-12-01 NOTE — Progress Notes (Signed)
Physical Therapy Treatment Patient Details Name: Bridget [name] MRN: [id number] DOB: [birth year] Today's Date: 12/01/2020    History of Present Illness The pt is a 87 yo female presenting 6/16 due to acute onset blindness. Pt received IV tPA due to concern for stroke, imaging revealed bilateral scattered posterior circulation infarcts on 6/16, repeat MRI on 6/17 showing parenchymal hemorrhage in the medial left occipital lobe. PMH includes: HFpEF, CKD III, COPD, HTN, dementia, and afib.    PT Comments    Pt received in bed, pleasant and willing to participate in therapy. She required supervision supine to sit, min guard assist transfers, and min guard assist ambulation 52' with RW. Gait distance limited by LE weakness. Pt in recliner with feet elevated at end of session.    Follow Up Recommendations  Home health PT;Supervision/Assistance - 24 hour     Equipment Recommendations  3in1 (PT) (tub bench)    Recommendations for Other Services       Precautions / Restrictions Precautions Precautions: Fall;Other (comment) Precaution Comments: R and L visual deficits.    Mobility  Bed Mobility Overal bed mobility: Needs Assistance Bed Mobility: Supine to Sit     Supine to sit: Supervision;HOB elevated     General bed mobility comments: +rail, increased time    Transfers Overall transfer level: Needs assistance Equipment used: Rolling walker (2 wheeled) Transfers: Sit to/from Stand Sit to Stand: Min guard         General transfer comment: cues for sequencing, assist for safety  Ambulation/Gait Ambulation/Gait assistance: Min guard Gait Distance (Feet): 80 Feet Assistive device: Rolling walker (2 wheeled) Gait Pattern/deviations: Step-through pattern;Decreased stride length Gait velocity: decreased Gait velocity interpretation: <1.31 ft/sec, indicative of household ambulator General Gait Details: Pt stating "my legs feel weak and shaky." No LOB noted. No knee buckling  noted. Smooth, fluid gait pattern.   Stairs             Wheelchair Mobility    Modified Rankin (Stroke Patients Only) Modified Rankin (Stroke Patients Only) Pre-Morbid Rankin Score: Moderately severe disability Modified Rankin: Moderately severe disability     Balance Overall balance assessment: Needs assistance Sitting-balance support: Feet supported;No upper extremity supported Sitting balance-Leahy Scale: Good     Standing balance support: Bilateral upper extremity supported;Single extremity supported;During functional activity Standing balance-Leahy Scale: Poor Standing balance comment: reliant on external support                            Cognition Arousal/Alertness: Awake/alert Behavior During Therapy: WFL for tasks assessed/performed Overall Cognitive Status: History of cognitive impairments - at baseline                                        Exercises      General Comments        Pertinent Vitals/Pain Pain Assessment: No/denies pain    Home Living                      Prior Function            PT Goals (current goals can now be found in the care plan section) Acute Rehab PT Goals Patient Stated Goal: home Progress towards PT goals: Progressing toward goals    Frequency    Min 4X/week      PT Plan Current plan remains appropriate  Co-evaluation              AM-PAC PT "6 Clicks" Mobility   Outcome Measure  Help needed turning from your back to your side while in a flat bed without using bedrails?: None Help needed moving from lying on your back to sitting on the side of a flat bed without using bedrails?: A Little Help needed moving to and from a bed to a chair (including a wheelchair)?: A Little Help needed standing up from a chair using your arms (e.g., wheelchair or bedside chair)?: A Little Help needed to walk in hospital room?: A Little Help needed climbing 3-5 steps with a railing?  : A Little 6 Click Score: 19    End of Session Equipment Utilized During Treatment: Gait belt Activity Tolerance: Patient tolerated treatment well Patient left: in chair;with call bell/phone within reach;with chair alarm set Nurse Communication: Mobility status PT Visit Diagnosis: Unsteadiness on feet (R26.81);Other abnormalities of gait and mobility (R26.89);Muscle weakness (generalized) (M62.81)     Time: 3567-0141 PT Time Calculation (min) (ACUTE ONLY): 16 min  Charges:  $Gait Training: 8-22 mins                     Aida Raider, PT  Office # [phone] Pager [phone]    Ilda Foil 12/01/2020, 11:07 AM

## 2020-12-01 NOTE — TOC Benefit Eligibility Note (Signed)
Patient Product/process development scientist completed.    The patient is currently admitted and upon discharge could be taking Eliquis 5 mg.  The current 30 day co-pay is, $30.00.   The patient is insured through [employer] Part D    Roland Earl, CPhT Pharmacy Patient Advocate Specialist [facility] Antimicrobial Stewardship Team Direct Number: [phone]  Fax: [phone]

## 2020-12-02 MED ORDER — ALBUTEROL SULFATE HFA 108 (90 BASE) MCG/ACT IN AERS: 2.0000 | INHALATION_SPRAY | Freq: Four times a day (QID) | RESPIRATORY_TRACT | 0 refills | Status: AC | PRN

## 2020-12-02 MED ORDER — APIXABAN 2.5 MG PO TABS: 2.5000 mg | ORAL_TABLET | Freq: Two times a day (BID) | ORAL | 3 refills | Status: AC

## 2020-12-02 NOTE — Progress Notes (Signed)
Physical Therapy Treatment Patient Details Name: Bridget [name] MRN: [id number] DOB: [birth year] Today's Date: 12/02/2020    History of Present Illness The pt is a 87 yo female presenting 6/16 due to acute onset blindness. Pt received IV tPA due to concern for stroke, imaging revealed bilateral scattered posterior circulation infarcts on 6/16, repeat MRI on 6/17 showing parenchymal hemorrhage in the medial left occipital lobe. PMH includes: HFpEF, CKD III, COPD, HTN, dementia, and afib.    PT Comments    Patient soiled in urine on arrival. Patient able to perform dynamic standing balance to perform pericare independently. Patient unaware of bowel movement upon standing. Patient ambulated 44' with RW and min guard-supervision in uncontrolled environment. Continue to recommend HHPT and 24 hour supervision at discharge.     Follow Up Recommendations  Home health PT;Supervision/Assistance - 24 hour     Equipment Recommendations  3in1 (PT)    Recommendations for Other Services       Precautions / Restrictions Precautions Precautions: Fall;Other (comment) Precaution Comments: R and L visual deficits. Restrictions Weight Bearing Restrictions: No    Mobility  Bed Mobility               General bed mobility comments: up in the recliner    Transfers Overall transfer level: Needs assistance Equipment used: Rolling Juliya Magill (2 wheeled) Transfers: Sit to/from Stand Sit to Stand: Supervision         General transfer comment: supervision for safety. Sit to stand x 6 during session  Ambulation/Gait Ambulation/Gait assistance: Min guard;Supervision Gait Distance (Feet): 60 Feet Assistive device: Rolling Makynleigh Breslin (2 wheeled) Gait Pattern/deviations: Step-through pattern;Decreased stride length Gait velocity: decreased   General Gait Details: Reporting weakness in legs which limited ambulation distance. Min guard-supervision for safety. Able to navigate uncontrolled environment  without cues or assistance   Stairs             Wheelchair Mobility    Modified Rankin (Stroke Patients Only) Modified Rankin (Stroke Patients Only) Pre-Morbid Rankin Score: Moderately severe disability Modified Rankin: Moderately severe disability     Balance Overall balance assessment: Needs assistance Sitting-balance support: Feet supported;No upper extremity supported Sitting balance-Leahy Scale: Good Sitting balance - Comments: no UE support, no LOB   Standing balance support: No upper extremity supported;During functional activity Standing balance-Leahy Scale: Fair Standing balance comment: able to dynamically perform bathing in standing without UE support due to being soiled in urine                            Cognition Arousal/Alertness: Awake/alert Behavior During Therapy: WFL for tasks assessed/performed Overall Cognitive Status: History of cognitive impairments - at baseline                                        Exercises      General Comments        Pertinent Vitals/Pain Pain Assessment: No/denies pain    Home Living                      Prior Function            PT Goals (current goals can now be found in the care plan section) Acute Rehab PT Goals Patient Stated Goal: I think I'm going home today PT Goal Formulation: With patient Time For Goal Achievement: 12/12/20 Potential  to Achieve Goals: Good Progress towards PT goals: Progressing toward goals    Frequency    Min 4X/week      PT Plan Current plan remains appropriate    Co-evaluation              AM-PAC PT "6 Clicks" Mobility   Outcome Measure  Help needed turning from your back to your side while in a flat bed without using bedrails?: None Help needed moving from lying on your back to sitting on the side of a flat bed without using bedrails?: A Little Help needed moving to and from a bed to a chair (including a wheelchair)?: A  Little Help needed standing up from a chair using your arms (e.g., wheelchair or bedside chair)?: A Little Help needed to walk in hospital room?: A Little Help needed climbing 3-5 steps with a railing? : A Little 6 Click Score: 19    End of Session Equipment Utilized During Treatment: Gait belt Activity Tolerance: Patient tolerated treatment well Patient left: in chair;with call bell/phone within reach;with chair alarm set Nurse Communication: Mobility status PT Visit Diagnosis: Unsteadiness on feet (R26.81);Other abnormalities of gait and mobility (R26.89);Muscle weakness (generalized) (M62.81)     Time: 1017-5102 PT Time Calculation (min) (ACUTE ONLY): 24 min  Charges:  $Gait Training: 8-22 mins $Therapeutic Activity: 8-22 mins                     Mulan Adan A. Dan Humphreys PT, DPT Acute Rehabilitation Services Pager [phone] Office [phone]    Viviann Spare 12/02/2020, 5:08 PM

## 2020-12-02 NOTE — Progress Notes (Signed)
Occupational Therapy Treatment Patient Details Name: Bridget [name] MRN: [id number] DOB: 01/18/1926 Today's Date: 12/02/2020    History of present illness The pt is a 87 yo female presenting 6/16 due to acute onset blindness. Pt received IV tPA due to concern for stroke, imaging revealed bilateral scattered posterior circulation infarcts on 6/16, repeat MRI on 6/17 showing parenchymal hemorrhage in the medial left occipital lobe. PMH includes: HFpEF, CKD III, COPD, HTN, dementia, and afib.   OT comments  Patient continues to progress toward patient focused OT goals.  Patient is scanning her L and R visual field much better this date.  She was able to walk around the foot of the bed toward the sink with Min Guard.  She was able to perform a light grooming task in sit/stand with supervision.  Home with home health OT and 24 hour assist as needed is recommended.  OT will continue to follow her in the acute setting to maximize her functional status.    Follow Up Recommendations  Home health OT;Supervision/Assistance - 24 hour    Equipment Recommendations  3 in 1 bedside commode    Recommendations for Other Services      Precautions / Restrictions Precautions Precautions: Fall;Other (comment) Restrictions Weight Bearing Restrictions: No       Mobility Bed Mobility               General bed mobility comments: up in the recliner Patient Response: Cooperative  Transfers Overall transfer level: Needs assistance Equipment used: Rolling walker (2 wheeled) Transfers: Sit to/from Stand Sit to Stand: Supervision              Balance Overall balance assessment: Needs assistance Sitting-balance support: Feet supported;No upper extremity supported Sitting balance-Leahy Scale: Good     Standing balance support: Bilateral upper extremity supported;Single extremity supported;During functional activity Standing balance-Leahy Scale: Fair Standing balance comment: reliant on  external support                           ADL either performed or assessed with clinical judgement   ADL       Grooming: Wash/dry hands;Oral care;Cueing for compensatory techniques;Sitting;Supervision/safety;Standing                               Functional mobility during ADLs: Min guard;Rolling walker       Vision Patient Visual Report: Peripheral vision impairment Additional Comments: patient appears to be attending to L and R visual fields better... able to walk around the foot of the bed and manuver the RW around obstacles.   Perception     Praxis      Cognition Arousal/Alertness: Awake/alert Behavior During Therapy: WFL for tasks assessed/performed Overall Cognitive Status: History of cognitive impairments - at baseline                                                            Pertinent Vitals/ Pain       Pain Assessment: No/denies pain  Frequency  Min 2X/week        Progress Toward Goals  OT Goals(current goals can now be found in the care plan section)  Progress towards OT goals: Progressing toward goals  Acute Rehab OT Goals Patient Stated Goal: I think I'm going home today OT Goal Formulation: With patient Time For Goal Achievement: 12/12/20 Potential to Achieve Goals: Good  Plan Discharge plan remains appropriate    Co-evaluation                 AM-PAC OT "6 Clicks" Daily Activity     Outcome Measure   Help from another person eating meals?: None Help from another person taking care of personal grooming?: A Little Help from another person toileting, which includes using toliet, bedpan, or urinal?: A Little Help from another person bathing (including washing, rinsing, drying)?: A Little Help from another person to put on and taking off regular upper body clothing?: A Little Help from another person to put  on and taking off regular lower body clothing?: A Little 6 Click Score: 19    End of Session Equipment Utilized During Treatment: Rolling walker  OT Visit Diagnosis: Unsteadiness on feet (R26.81);Muscle weakness (generalized) (M62.81);Other symptoms and signs involving cognitive function   Activity Tolerance Patient tolerated treatment well   Patient Left in chair;with call bell/phone within reach;with chair alarm set   Nurse Communication          Time: 6767-2094 OT Time Calculation (min): 13 min  Charges: OT General Charges $OT Visit: 1 Visit OT Treatments $Self Care/Home Management : 8-22 mins  12/02/2020  Rich, OTR/L  Acute Rehabilitation Services  Office:  [phone]    Suzanna Obey 12/02/2020, 2:57 PM

## 2020-12-02 NOTE — TOC Transition Note (Addendum)
Transition of Care [facility]) - CM/SW Discharge Note   Patient Details  Name: Bridget [name] MRN: [id number] Date of Birth: 02-Feb-1926  Transition of Care [facility]) CM/SW Contact:  Kermit Balo, RN Phone Number: 12/02/2020, 1:23 PM   Clinical Narrative:    Patient is discharging home with resumption of HH services through [place]. 3 in 1 for home is at the bedside.  30 day free eliquis coupon left for patient daughter in the room. Daughter to provide transport home. Bedside RN updated.    Final next level of care: Home w Home Health Services Barriers to Discharge: No Barriers Identified   Patient Goals and CMS Choice     Choice offered to / list presented to : Adult Children  Discharge Placement                       Discharge Plan and Services   Discharge Planning Services: CM Consult Post Acute Care Choice: Home Health, Durable Medical Equipment          DME Arranged: 3-N-1 DME Agency: [employer] Date DME Agency Contacted: 11/30/20   Representative spoke with at DME Agency: Velna Hatchet HH Arranged: PT, OT, Nurse's Aide HH Agency: [facility] Date [facility] Agency Contacted: 11/30/20   Representative spoke with at [facility] Agency: Kandee Keen  Social Determinants of Health (SDOH) Interventions     Readmission Risk Interventions No flowsheet data found.

## 2020-12-02 NOTE — Plan of Care (Signed)

## 2020-12-03 ENCOUNTER — Inpatient Hospital Stay (HOSPITAL_COMMUNITY)
Admission: EM | Admit: 2020-12-03 | Discharge: 2020-12-06 | DRG: 057 | Disposition: A | Discharge status: Home Health | Payer: Medicare Other | Attending: Family Medicine | Admitting: Family Medicine

## 2020-12-03 ENCOUNTER — Emergency Department (HOSPITAL_COMMUNITY): Payer: Medicare Other

## 2020-12-03 ENCOUNTER — Encounter (HOSPITAL_COMMUNITY): Payer: Self-pay | Admitting: Emergency Medicine

## 2020-12-03 ENCOUNTER — Inpatient Hospital Stay (HOSPITAL_COMMUNITY): Payer: Medicare Other

## 2020-12-03 ENCOUNTER — Other Ambulatory Visit: Payer: Self-pay

## 2020-12-03 DIAGNOSIS — N183 Chronic kidney disease, stage 3 unspecified: Secondary | ICD-10-CM | POA: Diagnosis present

## 2020-12-03 DIAGNOSIS — E1165 Type 2 diabetes mellitus with hyperglycemia: Secondary | ICD-10-CM | POA: Diagnosis present

## 2020-12-03 DIAGNOSIS — I130 Hypertensive heart and chronic kidney disease with heart failure and stage 1 through stage 4 chronic kidney disease, or unspecified chronic kidney disease: Secondary | ICD-10-CM | POA: Diagnosis present

## 2020-12-03 DIAGNOSIS — Z28310 Unvaccinated for covid-19: Secondary | ICD-10-CM | POA: Diagnosis not present

## 2020-12-03 DIAGNOSIS — I672 Cerebral atherosclerosis: Secondary | ICD-10-CM | POA: Diagnosis present

## 2020-12-03 DIAGNOSIS — R29707 NIHSS score 7: Secondary | ICD-10-CM | POA: Diagnosis present

## 2020-12-03 DIAGNOSIS — I4821 Permanent atrial fibrillation: Secondary | ICD-10-CM | POA: Diagnosis present

## 2020-12-03 DIAGNOSIS — Z79899 Other long term (current) drug therapy: Secondary | ICD-10-CM | POA: Diagnosis not present

## 2020-12-03 DIAGNOSIS — R296 Repeated falls: Secondary | ICD-10-CM | POA: Diagnosis present

## 2020-12-03 DIAGNOSIS — I482 Chronic atrial fibrillation, unspecified: Secondary | ICD-10-CM | POA: Diagnosis present

## 2020-12-03 DIAGNOSIS — I69354 Hemiplegia and hemiparesis following cerebral infarction affecting left non-dominant side: Secondary | ICD-10-CM | POA: Diagnosis not present

## 2020-12-03 DIAGNOSIS — F03918 Unspecified dementia, unspecified severity, with other behavioral disturbance: Secondary | ICD-10-CM | POA: Diagnosis present

## 2020-12-03 DIAGNOSIS — I2510 Atherosclerotic heart disease of native coronary artery without angina pectoris: Secondary | ICD-10-CM | POA: Diagnosis present

## 2020-12-03 DIAGNOSIS — E785 Hyperlipidemia, unspecified: Secondary | ICD-10-CM | POA: Diagnosis present

## 2020-12-03 DIAGNOSIS — Z7901 Long term (current) use of anticoagulants: Secondary | ICD-10-CM

## 2020-12-03 DIAGNOSIS — I5032 Chronic diastolic (congestive) heart failure: Secondary | ICD-10-CM | POA: Diagnosis present

## 2020-12-03 DIAGNOSIS — Z8616 Personal history of COVID-19: Secondary | ICD-10-CM

## 2020-12-03 DIAGNOSIS — N1831 Chronic kidney disease, stage 3a: Secondary | ICD-10-CM | POA: Diagnosis present

## 2020-12-03 DIAGNOSIS — H53461 Homonymous bilateral field defects, right side: Secondary | ICD-10-CM | POA: Diagnosis present

## 2020-12-03 DIAGNOSIS — I10 Essential (primary) hypertension: Secondary | ICD-10-CM | POA: Diagnosis present

## 2020-12-03 DIAGNOSIS — Z515 Encounter for palliative care: Secondary | ICD-10-CM | POA: Diagnosis not present

## 2020-12-03 DIAGNOSIS — I639 Cerebral infarction, unspecified: Secondary | ICD-10-CM

## 2020-12-03 DIAGNOSIS — I63439 Cerebral infarction due to embolism of unspecified posterior cerebral artery: Secondary | ICD-10-CM | POA: Diagnosis not present

## 2020-12-03 DIAGNOSIS — J449 Chronic obstructive pulmonary disease, unspecified: Secondary | ICD-10-CM | POA: Diagnosis present

## 2020-12-03 DIAGNOSIS — F0391 Unspecified dementia with behavioral disturbance: Secondary | ICD-10-CM | POA: Diagnosis present

## 2020-12-03 DIAGNOSIS — I69312 Visuospatial deficit and spatial neglect following cerebral infarction: Secondary | ICD-10-CM | POA: Diagnosis not present

## 2020-12-03 DIAGNOSIS — Z20822 Contact with and (suspected) exposure to covid-19: Secondary | ICD-10-CM | POA: Diagnosis present

## 2020-12-03 DIAGNOSIS — Z87891 Personal history of nicotine dependence: Secondary | ICD-10-CM

## 2020-12-03 DIAGNOSIS — Z66 Do not resuscitate: Secondary | ICD-10-CM | POA: Diagnosis present

## 2020-12-03 DIAGNOSIS — Z7189 Other specified counseling: Secondary | ICD-10-CM | POA: Diagnosis not present

## 2020-12-03 LAB — CBC
HCT: 48 % — ABNORMAL HIGH (ref 36.0–46.0)
Hemoglobin: 15.7 g/dL — ABNORMAL HIGH (ref 12.0–15.0)
MCH: 31.4 pg (ref 26.0–34.0)
MCHC: 32.7 g/dL (ref 30.0–36.0)
MCV: 96 fL (ref 80.0–100.0)
Platelets: 155 10*3/uL (ref 150–400)
RBC: 5 MIL/uL (ref 3.87–5.11)
RDW: 13.9 % (ref 11.5–15.5)
WBC: 7.7 10*3/uL (ref 4.0–10.5)
nRBC: 0 % (ref 0.0–0.2)

## 2020-12-03 LAB — COMPREHENSIVE METABOLIC PANEL
ALT: 27 U/L (ref 0–44)
AST: 30 U/L (ref 15–41)
Albumin: 3.1 g/dL — ABNORMAL LOW (ref 3.5–5.0)
Alkaline Phosphatase: 55 U/L (ref 38–126)
Anion gap: 9 (ref 5–15)
BUN: 18 mg/dL (ref 8–23)
CO2: 30 mmol/L (ref 22–32)
Calcium: 8.8 mg/dL — ABNORMAL LOW (ref 8.9–10.3)
Chloride: 97 mmol/L — ABNORMAL LOW (ref 98–111)
Creatinine, Ser: 0.92 mg/dL (ref 0.44–1.00)
GFR, Estimated: 58 mL/min — ABNORMAL LOW (ref >60)
Glucose, Bld: 106 mg/dL — ABNORMAL HIGH (ref 70–99)
Potassium: 3.7 mmol/L (ref 3.5–5.1)
Sodium: 136 mmol/L (ref 135–145)
Total Bilirubin: 0.7 mg/dL (ref 0.3–1.2)
Total Protein: 6.2 g/dL — ABNORMAL LOW (ref 6.5–8.1)

## 2020-12-03 LAB — DIFFERENTIAL
Abs Immature Granulocytes: 0.02 10*3/uL (ref 0.00–0.07)
Basophils Absolute: 0.1 10*3/uL (ref 0.0–0.1)
Basophils Relative: 1 %
Eosinophils Absolute: 0.2 10*3/uL (ref 0.0–0.5)
Eosinophils Relative: 3 %
Immature Granulocytes: 0 %
Lymphocytes Relative: 27 %
Lymphs Abs: 2.1 10*3/uL (ref 0.7–4.0)
Monocytes Absolute: 0.6 10*3/uL (ref 0.1–1.0)
Monocytes Relative: 8 %
Neutro Abs: 4.7 10*3/uL (ref 1.7–7.7)
Neutrophils Relative %: 61 %

## 2020-12-03 LAB — I-STAT CHEM 8, ED
BUN: 19 mg/dL (ref 8–23)
Calcium, Ion: 0.96 mmol/L — ABNORMAL LOW (ref 1.15–1.40)
Chloride: 97 mmol/L — ABNORMAL LOW (ref 98–111)
Creatinine, Ser: 0.8 mg/dL (ref 0.44–1.00)
Glucose, Bld: 103 mg/dL — ABNORMAL HIGH (ref 70–99)
HCT: 48 % — ABNORMAL HIGH (ref 36.0–46.0)
Hemoglobin: 16.3 g/dL — ABNORMAL HIGH (ref 12.0–15.0)
Potassium: 3.5 mmol/L (ref 3.5–5.1)
Sodium: 136 mmol/L (ref 135–145)
TCO2: 26 mmol/L (ref 22–32)

## 2020-12-03 LAB — RESP PANEL BY RT-PCR (FLU A&B, COVID) ARPGX2
Influenza A by PCR: NEGATIVE
Influenza B by PCR: NEGATIVE
SARS Coronavirus 2 by RT PCR: NEGATIVE

## 2020-12-03 LAB — APTT: aPTT: 30 seconds (ref 24–36)

## 2020-12-03 LAB — PROTIME-INR
INR: 1.4 — ABNORMAL HIGH (ref 0.8–1.2)
Prothrombin Time: 16.9 seconds — ABNORMAL HIGH (ref 11.4–15.2)

## 2020-12-03 LAB — CBG MONITORING, ED: Glucose-Capillary: 105 mg/dL — ABNORMAL HIGH (ref 70–99)

## 2020-12-03 MED ORDER — APIXABAN 2.5 MG PO TABS
2.5000 mg | ORAL_TABLET | Freq: Two times a day (BID) | ORAL | Status: DC
  Administered 2020-12-03 – 2020-12-06 (×6): 2.5 mg via ORAL
  Filled 2020-12-03 (×5): qty 1

## 2020-12-03 MED ORDER — STROKE: EARLY STAGES OF RECOVERY BOOK
Freq: Once | Status: AC
  Filled 2020-12-03: qty 1

## 2020-12-03 MED ORDER — HALOPERIDOL LACTATE 5 MG/ML IJ SOLN
1.0000 mg | Freq: Once | INTRAMUSCULAR | Status: AC
  Administered 2020-12-03: 1 mg via INTRAVENOUS
  Filled 2020-12-03: qty 1

## 2020-12-03 MED ORDER — ACETAMINOPHEN 650 MG RE SUPP: 650.0000 mg | RECTAL | Status: DC | PRN

## 2020-12-03 MED ORDER — SODIUM CHLORIDE 0.9 % IV SOLN: INTRAVENOUS | Status: DC

## 2020-12-03 MED ORDER — ACETAMINOPHEN 160 MG/5ML PO SOLN: 650.0000 mg | ORAL | Status: DC | PRN

## 2020-12-03 MED ORDER — HALOPERIDOL LACTATE 5 MG/ML IJ SOLN
INTRAMUSCULAR | Status: AC
  Filled 2020-12-03: qty 1

## 2020-12-03 MED ORDER — ATORVASTATIN CALCIUM 10 MG PO TABS
10.0000 mg | ORAL_TABLET | Freq: Every day | ORAL | Status: DC
  Administered 2020-12-03: 10 mg via ORAL
  Filled 2020-12-03: qty 1

## 2020-12-03 MED ORDER — SENNOSIDES-DOCUSATE SODIUM 8.6-50 MG PO TABS
1.0000 | ORAL_TABLET | Freq: Every evening | ORAL | Status: DC | PRN
  Administered 2020-12-05: 1 via ORAL
  Filled 2020-12-03: qty 1

## 2020-12-03 MED ORDER — ACETAMINOPHEN 325 MG PO TABS: 650.0000 mg | ORAL_TABLET | ORAL | Status: DC | PRN

## 2020-12-03 MED ORDER — IPRATROPIUM-ALBUTEROL 0.5-2.5 (3) MG/3ML IN SOLN: 3.0000 mL | Freq: Four times a day (QID) | RESPIRATORY_TRACT | Status: DC | PRN

## 2020-12-03 MED ORDER — LORAZEPAM 2 MG/ML IJ SOLN
0.5000 mg | Freq: Once | INTRAMUSCULAR | Status: AC | PRN
  Administered 2020-12-03: 0.5 mg via INTRAVENOUS
  Filled 2020-12-03: qty 1

## 2020-12-03 MED ORDER — IOHEXOL 350 MG/ML SOLN
75.0000 mL | Freq: Once | INTRAVENOUS | Status: AC | PRN
  Administered 2020-12-03: 75 mL via INTRAVENOUS

## 2020-12-03 MED ORDER — SODIUM CHLORIDE 0.9% FLUSH
3.0000 mL | Freq: Once | INTRAVENOUS | Status: AC
  Administered 2020-12-03: 3 mL via INTRAVENOUS

## 2020-12-03 NOTE — ED Triage Notes (Signed)
Pt arrives via EMS as code stroke. LSN 1330. Previous stroke within two weeks. Pt has sudden onset vision loss in both eyes. Pt also complains of left arm weakness. BP 120 systolic, Hr 65, CBG 121

## 2020-12-03 NOTE — Progress Notes (Signed)
Responded to code stroke to support patient.  Patient going to CT.  Will follow as needed.

## 2020-12-03 NOTE — ED Notes (Signed)
Returned from MRI at this time.

## 2020-12-03 NOTE — Code Documentation (Signed)
Stroke Response Nurse Documentation Code Documentation  Bridget [name] is a 87 y.o. female arriving to [facility] ED via [facility] on 12/03/2020 with past medical hx of CVA. Code stroke was activated by EMS. Patient from home where she was LKW at 1330 and now complaining of worsening vision and left sided weakness. On Eliquis (apixaban) daily started two days ago.   Stroke team at the bedside on patient arrival. Labs drawn and patient cleared for CT by Dr. Madilyn Hook. Patient to CT with team. NIHSS 7, see documentation for details and code stroke times. Patient with disoriented, left hemianopia, left arm weakness, left leg weakness, and left decreased sensation on exam. The following imaging was completed: CT, CTA head and neck. Patient is not a candidate for tPA due to recent stroke.    Care/Plan: q 2 mNIHSS/VS. Bedside handoff with ED RN Thayer Ohm.    Lucila Maine  Stroke Response RN

## 2020-12-03 NOTE — ED Notes (Signed)
Remains in MRI 

## 2020-12-03 NOTE — Consult Note (Signed)
Neurology Consultation  Reason for Consult: Sudden loss of vision, left upper extremity weakness Referring Physician: Dr. Harmon Pierurtatolo  CC: Sudden loss of vision  History is obtained from: Chart review, EMS, Patient  HPI: Bridget [name] is a 87 y.o. 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 She started taking Eliquis 2 days ago for her chronic atrial fibrillation but had not previously been on anticoagulation due to frequent falls and dementia history. Today, family noted sudden loss of vision and left-sided weakness greater than baseline weakness from recent stroke. EMS was activated and patient was brought in as a Code Stroke for further evaluation. She endorses a frontal headache with frontal onset approximately 30 minutes prior to vision loss. She describes her vision loss as black and repeatedly asks EMT personnel to turn the lights on to help her see despite bright lights overhead.    LKW: 13:30 tpa given?: no, started Eliquis two days ago 12/01/2020, recent stroke 11/26/2020 IR Thrombectomy? No, presentation not consistent with LVO CTA shows a right PCA distal branch occlusion patient's baseline functioning is too poor and CT scan shows a large right PCA infarct Modified Rankin Scale: 4-Needs assistance to walk and tend to bodily needs  ROS: A complete ROS was performed and is negative except as noted in the HPI.   Past Medical History:  Diagnosis Date   (HFpEF) heart failure with preserved ejection fraction (HCC)    a. 08/2020 Echo: EF 60-65%, no rwma, Gr1 DD, nl RV fxn. Mildly dil RA. Mild MR/AI.   Asthma    CKD (chronic kidney disease), stage III (HCC)    COPD (chronic obstructive pulmonary disease) (HCC)    Falls    Hypertension    Mild Dementia (HCC)     Permanent atrial fibrillation (HCC)    a. CHA2DS2VASc = 5-->no OAC 2/2 unsteady gait/falls.   Unsteady gait    Past Surgical History:  Procedure Laterality Date   APPENDECTOMY     BREAST SURGERY     HERNIA REPAIR     Family History  Problem Relation Age of Onset   Immunocompromised Neg Hx    Social History:   reports that she has quit smoking. She has never used smokeless tobacco. She reports that she does not drink alcohol and does not use drugs.  Medications  Current Facility-Administered Medications:    haloperidol lactate (HALDOL) injection 1 mg, 1 mg, Intravenous, Once, Malie Kashani, Jason FilaPramod S, MD   sodium chloride flush (NS) 0.9 % injection 3 mL, 3 mL, Intravenous, Once, Alvira MondaySchlossman, Erin, MD  Current Outpatient Medications:    albuterol (VENTOLIN HFA) 108 (90 Base) MCG/ACT inhaler, Inhale 2 puffs into the lungs every 6 (six) hours as needed for wheezing or shortness of breath., Disp: 8 g, Rfl: 0   apixaban (ELIQUIS) 2.5 MG TABS tablet, Take 1 tablet (2.5 mg total) by mouth 2 (two) times daily., Disp: 60 tablet, Rfl: 3   atorvastatin (LIPITOR) 10 MG tablet, Take 1 tablet (10 mg total) by mouth at bedtime., Disp: 30 tablet, Rfl: 0   bisoprolol (ZEBETA) 5 MG tablet, Take 1 tablet (5 mg total) by mouth daily., Disp: 30 tablet, Rfl: 0   furosemide (LASIX) 40 MG tablet, Take 1 tablet (40 mg total) by mouth daily., Disp: 30 tablet, Rfl: 1   ipratropium-albuterol (DUONEB) 0.5-2.5 (3) MG/3ML SOLN, Take  3 mLs by nebulization 4 (four) times daily. (Patient taking differently: Take 3 mLs by nebulization every 6 (six) hours as needed (sob/wheezing).), Disp: 360 mL, Rfl: 0  Exam: Current vital signs: There were no vitals taken for this visit. Vital signs in last 24 hours: Temp:  [97.6 F (36.4 C)] 97.6 F (36.4 C) (06/22 1540) Pulse Rate:  [52] 52 (06/22 1540) Resp:  [16] 16 (06/22 1540) BP: (114)/(70) 114/70 (06/22 1540) SpO2:  [99 %] 99 % (06/22 1540)  GENERAL: Awake, alert, appears  anxious Psych: Cooperative with examination, becomes anxious and agitated while on CT scanner attempting to ambulate and remove safety strap Head: Normocephalic and atraumatic without obvious abnormality EENT: No OP obstruction, dry mucous membranes LUNGS: Normal respiratory effort. Non-labored breathing CV: Irregular rate and rhythm on cardiac monitor ABDOMEN: Soft, non-distended Ext: warm, well perfused, no obvious deformity  NEURO:  Mental Status: Awake, alert, and oriented to self. She incorrectly states that she is 87 years old and that the month is May. She is able to provide minor details regarding history of present illness but does have trouble with timelines.  Speech/Language: speech is intact without dysarthria.   Fluency and comprehension are intact. 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 Cranial Nerves:  II: PERRL. Complete vision loss in the left visual field, able to track moving objects and count fingers with some difficulty in the right visual field only at 2 feet.  She is able to perceive light in both eyes. III, IV, VI: EOMI without ptosis.  V: Sensation is intact to light touch and symmetrical to face. Does not blink to threat initially throughout.  VII: Face is symmetric resting and smiling.  VIII: Hearing is intact to voice. IX, X: Palate elevation is symmetric. Phonation normal.  XI: Normal sternocleidomastoid and trapezius muscle strength. XII: Tongue protrudes midline. Motor: 5/5 strength present in the right upper and lower extremities without vertical drift on assessment. 4/5 strength present in the left upper and lower extremities with vertical drift on assessment.  Mild weakness of left grip.  Diminished fine finger movements on the left.  Orbits right over left upper extremity. Tone is normal. Bulk is normal.  Sensation: Does not withdraw to noxious stimuli in the left upper extremity, sensation is absent in the left upper extremity. Left  lower extremity has decreased sensation to light touch compared to the right lower extremity. Right upper and lower extremity with intact sensation to light touch.   Coordination: Unable to assess FNF due to impaired vision. Unable to assess HKS of left lower extremity due to LLE weakness.  Gait: Deferred  NIHSS: 1a Level of Conscious.: 0 1b LOC Questions: 2 1c LOC Commands: 0 2 Best Gaze: 0 3 Visual: 2 4 Facial Palsy: 0 5a Motor Arm - left: 1 5b Motor Arm - Right: 0 6a Motor Leg - Left: 1 6b Motor Leg - Right: 0 7 Limb Ataxia: 0 8 Sensory: 1 9 Best Language: 0 10 Dysarthria: 0 11 Extinct. and Inatten.: 0 TOTAL: 7  Labs I have reviewed labs in epic and the results pertinent to this consultation are: CBC    Component Value Date/Time   WBC 13.5 (H) 11/26/2020 1050   RBC 5.65 (H) 11/26/2020 1050   HGB 16.3 (H) 12/03/2020 1457   HCT 48.0 (H) 12/03/2020 1457   PLT 174 11/26/2020 1050   MCV 93.6 11/26/2020 1050   MCH 31.2 11/26/2020 1050   MCHC 33.3 11/26/2020 1050   RDW  13.8 11/26/2020 1050   LYMPHSABS 1.3 11/26/2020 1050   MONOABS 0.7 11/26/2020 1050   EOSABS 0.1 11/26/2020 1050   BASOSABS 0.0 11/26/2020 1050   CMP     Component Value Date/Time   NA 136 12/03/2020 1457   K 3.5 12/03/2020 1457   CL 97 (L) 12/03/2020 1457   CO2 34 (H) 11/26/2020 1050   GLUCOSE 103 (H) 12/03/2020 1457   BUN 19 12/03/2020 1457   CREATININE 0.80 12/03/2020 1457   CALCIUM 9.1 11/26/2020 1050   PROT 6.9 11/26/2020 1050   ALBUMIN 3.8 11/26/2020 1050   AST 31 11/26/2020 1050   ALT 33 11/26/2020 1050   ALKPHOS 47 11/26/2020 1050   BILITOT 1.6 (H) 11/26/2020 1050   GFRNONAA >60 11/26/2020 1050   Lipid Panel     Component Value Date/Time   CHOL 120 11/27/2020 0436   TRIG 64 11/27/2020 0436   HDL 39 (L) 11/27/2020 0436   CHOLHDL 3.1 11/27/2020 0436   VLDL 13 11/27/2020 0436   LDLCALC 68 11/27/2020 0436   Lab Results  Component Value Date   HGBA1C 6.3 (H) 11/27/2020    Imaging I have reviewed the images obtained:  CT-scan of the brain 12/03/2020: 1. Right PCA infarct shows progressive edema. This may be due to evolving infarct versus extension of infarct. No associated hemorrhage in the right occipital lobe. 2. Infarct in the left occipital lobe with hemorrhagic transformation unchanged from the recent CT. 3. Recent infarct in the right cerebellum appears stable.  CT angio head and neck 12/03/2020: CTA neck: The common carotid, internal carotid and vertebral arteries are patent within the neck without hemodynamically significant stenosis (50% or greater). Arterial atherosclerotic disease within the neck, as described.   CTA head: 1. Evaluation is limited due to poor intracranial arterial contrast enhancement. 2. However, a new right PCA branch occlusion is identified (at the P2 segment level). 3. Additionally, there is a severe stenosis within the proximal P3 segment of the left posterior cerebral artery, which was not definitively present on the prior CTA of 11/26/2020. 4. Additional intracranial atherosclerotic disease, as described, with sites of up to moderate stenosis in the bilateral intracranial ICAs and V4 right vertebral artery.  Echocardiogram 11/29/2020:  1. Left ventricular ejection fraction, by estimation, is 65 to 70%. The left ventricle has normal function. The left ventricle has no regional wall motion abnormalities. There is mild left ventricular hypertrophy. Left ventricular diastolic function could not be evaluated.   2. Right ventricular systolic function is normal. The right ventricular size is normal. There is moderately elevated pulmonary artery systolic pressure.   3. Left atrial size was moderately dilated.   4. Right atrial size was mildly dilated.   5. The mitral valve is abnormal. Moderate mitral valve regurgitation. There is mild late systolic prolapse of both leaflets of the mitral valve.   6. The aortic valve is tricuspid.  Aortic valve regurgitation is mild. Mild aortic valve sclerosis is present, with no evidence of aortic valve stenosis. Aortic regurgitation PHT measures 520 msec.   7. Rouleaux formation in the IVC suggestive of sluggish venous flow/venous stasis. The inferior vena cava is dilated in size with <50% respiratory variability, suggesting right atrial pressure of 15 mmHg.   Assessment: 87 y.o. 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 - Examination reveals patient with  complete loss of vision in the left visual field and fluctuating but mild left upper and lower extremity weakness.  - CT obtained with progressive edema of her right PCA infarct that may signify evolving infarct versus extension of known infarct. Also, her left occipital infarct showed hemorrhagic transformation unchanged from previous imaging. The right cerebellar infarct was stable in appearance. CT angio imaging showed a new right PCA branch occlusion at the P2 segment level and severe stenosis of the proximal P3 segment of the left posterior cerebral artery that is new from previous imaging.  - Chronic atrial fibrillation previously not on anticoagulation due to recent falls and mild dementia. Due to likely embolic nature of recent strokes, Eliquis was started 12/01/2020. - Presentation is most consistent with acute ischemic stroke- likely cardioembolic versus evolution or extension of previous infarct.  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  - Risk factor modification - Telemetry monitoring - PT consult, OT consult, Speech consult - Stroke team to follow  Lanae Boast, AGAC-NP [facility] Pager: [phone]  I have personally obtained history,examined this patient, reviewed notes, independently viewed imaging studies, participated in medical decision making and plan of care.ROS completed by me personally and pertinent positives fully documented  I have made any additions or clarifications directly to the above note. Agree with note above.  Patient well-known to me due to recent hospitalization for bilateral posterior cerebral artery strokes with new cortical blindness from A. fib was discharged home and had very limited vision and now presents to the complete loss of vision with mild left-sided weakness likely due to new right PCA infarct superimposed upon her recent strokes.  CT head is negative for any acute abnormality and CT angiogram shows right P3 branch occlusion which is new.  Etiology likely A. fib despite the fact that she was started on Eliquis few days ago.  Patient not a candidate for tPA due to anticoagulation and not a candidate for endovascular therapy due to poor baseline functioning.  I called and left a message on the patient's daughter Bridget [name]'s answering machine to call me back to discuss her care. Recommend check MRI scan of the brain to measure new infarct but no further stroke related work-up due to recent bleed work-up.  Continue Eliquis.  Physical occupational therapy consults.  May need to discuss goals of care possible placement needs with  family.  Greater than 50% time during this 80-minute consultation visit spent in counseling and coordination of care and discussion with care team.  Discussed with Dr. Lockie Mola and Dr. Ophelia Charter. Delia Heady, MD Medical Director [facility] Pager: [phone] 12/03/2020 5:01 PM

## 2020-12-03 NOTE — ED Provider Notes (Signed)
[facility] EMERGENCY DEPARTMENT Provider Note   CSN: [id number] Arrival date & time: 12/03/20  1451  An emergency department physician performed an initial assessment on this suspected stroke patient at 1448.  History Chief Complaint  Patient presents with   Code Stroke    CARLOYN LAHUE is a 87 y.o. female.  Patient with left sided weakness and blindness just prior to arrival, similar stroke symptoms recently.   The history is provided by the patient.  Neurologic Problem This is a new problem. The current episode started 1 to 2 hours ago. The problem occurs constantly. The problem has not changed since onset.Pertinent negatives include no chest pain, no abdominal pain and no shortness of breath. Nothing aggravates the symptoms. Nothing relieves the symptoms. She has tried nothing for the symptoms. The treatment provided no relief.      Past Medical History:  Diagnosis Date   (HFpEF) heart failure with preserved ejection fraction (HCC)    a. 08/2020 Echo: EF 60-65%, no rwma, Gr1 DD, nl RV fxn. Mildly dil RA. Mild MR/AI.   Asthma    CKD (chronic kidney disease), stage III (HCC)    COPD (chronic obstructive pulmonary disease) (HCC)    Falls    Hypertension    Mild Dementia (HCC)    Permanent atrial fibrillation (HCC)    a. CHA2DS2VASc = 5-->no OAC 2/2 unsteady gait/falls.   Unsteady gait     Patient Active Problem List   Diagnosis Date Noted   Ischemic stroke (HCC) 11/26/2020   Stroke (cerebrum) (HCC) 11/26/2020   Acute on chronic diastolic (congestive) heart failure (HCC) 11/13/2020   CKD (chronic kidney disease), stage II    Chronic diastolic CHF (congestive heart failure) (HCC)    Acute respiratory failure (HCC) 11/07/2020   Elevated troponin 11/07/2020   COPD with acute exacerbation (HCC) 08/31/2020   Atrial fibrillation, chronic (HCC) 08/31/2020   Acute CHF (congestive heart failure) (HCC) 08/31/2020   CKD (chronic kidney disease), stage III  (HCC) 08/31/2020   Essential hypertension 07/06/2020   Acute lower UTI 07/06/2020   Acute respiratory disease due to COVID-19 virus 07/05/2020    Past Surgical History:  Procedure Laterality Date   APPENDECTOMY     BREAST SURGERY     HERNIA REPAIR       OB History   No obstetric history on file.     Family History  Problem Relation Age of Onset   Immunocompromised Neg Hx     Social History   Tobacco Use   Smoking status: Former    Pack years: 0.00   Smokeless tobacco: Never  Substance Use Topics   Alcohol use: Never   Drug use: Never    Home Medications Prior to Admission medications   Medication Sig Start Date End Date Taking? Authorizing Provider  albuterol (VENTOLIN HFA) 108 (90 Base) MCG/ACT inhaler Inhale 2 puffs into the lungs every 6 (six) hours as needed for wheezing or shortness of breath. 12/02/20   Merry Lofty, NP  apixaban (ELIQUIS) 2.5 MG TABS tablet Take 1 tablet (2.5 mg total) by mouth 2 (two) times daily. 12/02/20   Merry Lofty, NP  atorvastatin (LIPITOR) 10 MG tablet Take 1 tablet (10 mg total) by mouth at bedtime. 11/10/20   Alford Highland, MD  bisoprolol (ZEBETA) 5 MG tablet Take 1 tablet (5 mg total) by mouth daily. 11/10/20   Alford Highland, MD  furosemide (LASIX) 40 MG tablet Take 1 tablet (40 mg total) by mouth  daily. 11/19/20   Leatha Gilding, MD  ipratropium-albuterol (DUONEB) 0.5-2.5 (3) MG/3ML SOLN Take 3 mLs by nebulization 4 (four) times daily. Patient taking differently: Take 3 mLs by nebulization every 6 (six) hours as needed (sob/wheezing). 11/10/20   Alford Highland, MD    Allergies    Patient has no known allergies.  Review of Systems   Review of Systems  Constitutional:  Negative for chills and fever.  HENT:  Negative for ear pain and sore throat.   Eyes:  Positive for visual disturbance (blindness). Negative for pain.  Respiratory:  Negative for cough and shortness of breath.   Cardiovascular:  Negative for chest  pain and palpitations.  Gastrointestinal:  Negative for abdominal pain and vomiting.  Genitourinary:  Negative for dysuria and hematuria.  Musculoskeletal:  Negative for arthralgias and back pain.  Skin:  Negative for color change and rash.  Neurological:  Positive for weakness. Negative for seizures and syncope.  All other systems reviewed and are negative.  Physical Exam Updated Vital Signs There were no vitals taken for this visit.  Physical Exam Vitals and nursing note reviewed.  Constitutional:      General: She is not in acute distress.    Appearance: She is well-developed. She is not ill-appearing.  HENT:     Head: Normocephalic and atraumatic.     Mouth/Throat:     Mouth: Mucous membranes are moist.  Eyes:     Extraocular Movements: Extraocular movements intact.     Conjunctiva/sclera: Conjunctivae normal.     Pupils: Pupils are equal, round, and reactive to light.     Comments: Patient appears to have some vision in the right eye to large objects, has very decreased vision in the left eye is able to see light  Cardiovascular:     Rate and Rhythm: Normal rate and regular rhythm.     Pulses: Normal pulses.     Heart sounds: Normal heart sounds. No murmur heard. Pulmonary:     Effort: Pulmonary effort is normal. No respiratory distress.     Breath sounds: Normal breath sounds.  Abdominal:     Palpations: Abdomen is soft.     Tenderness: There is no abdominal tenderness.  Musculoskeletal:     Cervical back: Neck supple.  Skin:    General: Skin is warm and dry.     Capillary Refill: Capillary refill takes less than 2 seconds.  Neurological:     General: No focal deficit present.     Mental Status: She is alert.     Comments: Left sided weakness with drift otherwise strength wnl, normal speech    ED Results / Procedures / Treatments   Labs (all labs ordered are listed, but only abnormal results are displayed) Labs Reviewed  PROTIME-INR - Abnormal; Notable for the  following components:      Result Value   Prothrombin Time 16.9 (*)    INR 1.4 (*)    All other components within normal limits  CBC - Abnormal; Notable for the following components:   Hemoglobin 15.7 (*)    HCT 48.0 (*)    All other components within normal limits  COMPREHENSIVE METABOLIC PANEL - Abnormal; Notable for the following components:   Chloride 97 (*)    Glucose, Bld 106 (*)    Calcium 8.8 (*)    Total Protein 6.2 (*)    Albumin 3.1 (*)    GFR, Estimated 58 (*)    All other components within normal limits  CBG  MONITORING, ED - Abnormal; Notable for the following components:   Glucose-Capillary 105 (*)    All other components within normal limits  I-STAT CHEM 8, ED - Abnormal; Notable for the following components:   Chloride 97 (*)    Glucose, Bld 103 (*)    Calcium, Ion 0.96 (*)    Hemoglobin 16.3 (*)    HCT 48.0 (*)    All other components within normal limits  RESP PANEL BY RT-PCR (FLU A&B, COVID) ARPGX2  APTT  DIFFERENTIAL  CBG MONITORING, ED    EKG None  Radiology CT HEAD CODE STROKE WO CONTRAST  Result Date: 12/03/2020 CLINICAL DATA:  Code stroke.  Acute neuro deficit.  Recent stroke. EXAM: CT HEAD WITHOUT CONTRAST TECHNIQUE: Contiguous axial images were obtained from the base of the skull through the vertex without intravenous contrast. COMPARISON:  CT head 11/28/2020, MRI head 11/27/2020 FINDINGS: Brain: Progressive cytotoxic edema in the right occipital lobe compatible with recent infarction. This was present previously but appears larger in territory which may be due to progressive edema or extension of infarction. No associated hemorrhage Cytotoxic edema infarct in the left occipital lobe with hemorrhagic transformation similar to the prior study. No new hemorrhage Recent infarct in the right lateral cerebellum best seen on MRI. No definite change. Generalized atrophy. Chronic microvascular ischemic changes throughout the white matter as noted previously.  Vascular: Negative for hyperdense vessel Skull: No acute abnormality Sinuses/Orbits: Multiple air-fluid levels in the sinuses. No acute orbital abnormality. Left cataract extraction. Other: None IMPRESSION: 1. Right PCA infarct shows progressive edema. This may be due to evolving infarct versus extension of infarct. No associated hemorrhage in the right occipital lobe. 2. Infarct in the left occipital lobe with hemorrhagic transformation unchanged from the recent CT. 3. Recent infarct in the right cerebellum appears stable. Electronically Signed   By: Marlan Palau M.D.   On: 12/03/2020 15:11   CT ANGIO HEAD CODE STROKE  Result Date: 12/03/2020 CLINICAL DATA:  Neuro deficit, acute, stroke suspected. Stroke/TIA, assess extracranial arteries. EXAM: CT ANGIOGRAPHY HEAD AND NECK TECHNIQUE: Multidetector CT imaging of the head and neck was performed using the standard protocol during bolus administration of intravenous contrast. Multiplanar CT image reconstructions and MIPs were obtained to evaluate the vascular anatomy. Carotid stenosis measurements (when applicable) are obtained utilizing NASCET criteria, using the distal internal carotid diameter as the denominator. CONTRAST:  75mL OMNIPAQUE IOHEXOL 350 MG/ML SOLN COMPARISON:  Noncontrast head CT examinations 12/03/2020 and earlier. CT angiogram head/neck 11/26/2020. FINDINGS: CTA NECK FINDINGS Aortic arch: Common origin of the innominate and left common carotid arteries. Atherosclerotic plaque within the visualized aortic arch and proximal major branch vessels of the neck. No hemodynamically significant innominate or proximal subclavian artery stenosis. Right carotid system: CCA and ICA patent within the neck. As before, there is soft and calcified plaque within the carotid bifurcation proximal ICA with less than 50% stenosis of the proximal ICA. Left carotid system: CCA and ICA patent within the neck. As before, there is scattered soft calcified plaque within  the mid to distal CCA and within the carotid bifurcation. No measurable stenosis of the proximal left ICA. Vertebral arteries: Codominant and patent within the neck. Nonstenotic atherosclerotic plaque at both vertebral artery origins. Skeleton: No acute bony abnormality or aggressive osseous lesion. Cervical spondylosis Other neck: No neck mass or cervical lymphadenopathy. Upper chest: No consolidation within the imaged lung apices. Biapical pleuroparenchymal scarring. Calcified granuloma within the periphery of the right upper lobe. Review of the  MIP images confirms the above findings CTA HEAD FINDINGS Evaluation of the intracranial arterial circulation is limited due to poor arterial contrast-enhancement. Anterior circulation: The intracranial internal carotid arteries are patent. Calcified plaque within both vessels with up to moderate stenosis within the distal cavernous/paraclinoid segments bilaterally. The M1 middle cerebral arteries are patent. Within described limitations, no definite proximal M2 branch occlusion or high-grade proximal M2 stenosis is identified. The anterior cerebral arteries are patent. Posterior circulation: The intracranial vertebral arteries are patent. Unchanged calcified plaque within the V4 right vertebral artery with resultant up to moderate stenosis. The basilar artery is patent. New from the prior examination, there is occlusion of a right PCA branch at the P2 segment level (series 13, image 24). (Series 11, image 20). The left posterior cerebral artery is patent. Severe stenosis within the proximal P3 left PCA, not definitively present on the prior CTA of 11/26/2020. Venous sinuses: Within the limitations of contrast timing, no convincing thrombus. Anatomic variants: Posterior communicating arteries are hypoplastic or absent bilaterally. Review of the MIP images confirms the above findings IMPRESSION: CTA neck: The common carotid, internal carotid and vertebral arteries are patent  within the neck without hemodynamically significant stenosis (50% or greater). Arterial atherosclerotic disease within the neck, as described. CTA head: 1. Evaluation is limited due to poor intracranial arterial contrast enhancement. 2. However, a new right PCA branch occlusion is identified (at the P2 segment level). 3. Additionally, there is a severe stenosis within the proximal P3 segment of the left posterior cerebral artery, which was not definitively present on the prior CTA of 11/26/2020. 4. Additional intracranial atherosclerotic disease, as described, with sites of up to moderate stenosis in the bilateral intracranial ICAs and V4 right vertebral artery. Electronically Signed   By: Jackey LogeKyle  Golden DO   On: 12/03/2020 15:56   CT ANGIO NECK CODE STROKE  Result Date: 12/03/2020 CLINICAL DATA:  Neuro deficit, acute, stroke suspected. Stroke/TIA, assess extracranial arteries. EXAM: CT ANGIOGRAPHY HEAD AND NECK TECHNIQUE: Multidetector CT imaging of the head and neck was performed using the standard protocol during bolus administration of intravenous contrast. Multiplanar CT image reconstructions and MIPs were obtained to evaluate the vascular anatomy. Carotid stenosis measurements (when applicable) are obtained utilizing NASCET criteria, using the distal internal carotid diameter as the denominator. CONTRAST:  75mL OMNIPAQUE IOHEXOL 350 MG/ML SOLN COMPARISON:  Noncontrast head CT examinations 12/03/2020 and earlier. CT angiogram head/neck 11/26/2020. FINDINGS: CTA NECK FINDINGS Aortic arch: Common origin of the innominate and left common carotid arteries. Atherosclerotic plaque within the visualized aortic arch and proximal major branch vessels of the neck. No hemodynamically significant innominate or proximal subclavian artery stenosis. Right carotid system: CCA and ICA patent within the neck. As before, there is soft and calcified plaque within the carotid bifurcation proximal ICA with less than 50% stenosis  of the proximal ICA. Left carotid system: CCA and ICA patent within the neck. As before, there is scattered soft calcified plaque within the mid to distal CCA and within the carotid bifurcation. No measurable stenosis of the proximal left ICA. Vertebral arteries: Codominant and patent within the neck. Nonstenotic atherosclerotic plaque at both vertebral artery origins. Skeleton: No acute bony abnormality or aggressive osseous lesion. Cervical spondylosis Other neck: No neck mass or cervical lymphadenopathy. Upper chest: No consolidation within the imaged lung apices. Biapical pleuroparenchymal scarring. Calcified granuloma within the periphery of the right upper lobe. Review of the MIP images confirms the above findings CTA HEAD FINDINGS Evaluation of the intracranial arterial circulation  is limited due to poor arterial contrast-enhancement. Anterior circulation: The intracranial internal carotid arteries are patent. Calcified plaque within both vessels with up to moderate stenosis within the distal cavernous/paraclinoid segments bilaterally. The M1 middle cerebral arteries are patent. Within described limitations, no definite proximal M2 branch occlusion or high-grade proximal M2 stenosis is identified. The anterior cerebral arteries are patent. Posterior circulation: The intracranial vertebral arteries are patent. Unchanged calcified plaque within the V4 right vertebral artery with resultant up to moderate stenosis. The basilar artery is patent. New from the prior examination, there is occlusion of a right PCA branch at the P2 segment level (series 13, image 24). (Series 11, image 20). The left posterior cerebral artery is patent. Severe stenosis within the proximal P3 left PCA, not definitively present on the prior CTA of 11/26/2020. Venous sinuses: Within the limitations of contrast timing, no convincing thrombus. Anatomic variants: Posterior communicating arteries are hypoplastic or absent bilaterally. Review  of the MIP images confirms the above findings IMPRESSION: CTA neck: The common carotid, internal carotid and vertebral arteries are patent within the neck without hemodynamically significant stenosis (50% or greater). Arterial atherosclerotic disease within the neck, as described. CTA head: 1. Evaluation is limited due to poor intracranial arterial contrast enhancement. 2. However, a new right PCA branch occlusion is identified (at the P2 segment level). 3. Additionally, there is a severe stenosis within the proximal P3 segment of the left posterior cerebral artery, which was not definitively present on the prior CTA of 11/26/2020. 4. Additional intracranial atherosclerotic disease, as described, with sites of up to moderate stenosis in the bilateral intracranial ICAs and V4 right vertebral artery. Electronically Signed   By: Jackey Loge DO   On: 12/03/2020 15:56    Procedures .Critical Care  Date/Time: 12/03/2020 4:15 PM Performed by: Virgina Norfolk, DO Authorized by: Virgina Norfolk, DO   Critical care provider statement:    Critical care time (minutes):  35   Critical care was necessary to treat or prevent imminent or life-threatening deterioration of the following conditions:  CNS failure or compromise   Critical care was time spent personally by me on the following activities:  Blood draw for specimens, development of treatment plan with patient or surrogate, discussions with consultants, discussions with primary provider, examination of patient, evaluation of patient's response to treatment, ordering and performing treatments and interventions, ordering and review of laboratory studies, ordering and review of radiographic studies, pulse oximetry, re-evaluation of patient's condition, review of old charts and obtaining history from patient or surrogate   I assumed direction of critical care for this patient from another provider in my specialty: no     Care discussed with: admitting provider      Medications Ordered in ED Medications  sodium chloride flush (NS) 0.9 % injection 3 mL (has no administration in time range)  haloperidol lactate (HALDOL) injection 1 mg (1 mg Intravenous Given 12/03/20 1509)  iohexol (OMNIPAQUE) 350 MG/ML injection 75 mL (75 mLs Intravenous Contrast Given 12/03/20 1508)    ED Course  I have reviewed the triage vital signs and the nursing notes.  Pertinent labs & imaging results that were available during my care of the patient were reviewed by me and considered in my medical decision making (see chart for details).    MDM Rules/Calculators/A&P                          HARVEY LINGO is a 87 year old female who presents  the ED as a code stroke.  History of A. fib not on blood thinners with recent stroke history where she received tPA who presents the ED with stroke symptoms again.  Vision loss mostly in the left eye as well as left-sided weakness about an hour prior to arrival.  Neurology at the bedside upon arrival.  CT scan shows right PCA infarct with progressive edema.  Could be from evolving infarct versus extension of infarct that she recently had.  Infarct in the left occipital lobe with hemorrhagic transformation is unchanged from the recent CT.  CTA shows new right PCA branch occlusion at the P2 segment level.  There is also severe stenosis within the proximal P3 segment of the left posterior cerebral artery which was not definitively seen on CTA last week.  She is not a candidate for tPA as she was recently given tPA and had recent stroke.  She is not a candidate for interventional procedure as well.  Lab work is otherwise unremarkable.  Currently she lives at home with family and will need admission for PT/OT/social work.  Neurology at this time not recommending MRI.  To be admitted to medicine service for further care.  Does appear that vision is slightly improving.  This chart was dictated using voice recognition software.  Despite best efforts to  proofread,  errors can occur which can change the documentation meaning.     Final Clinical Impression(s) / ED Diagnoses Final diagnoses:  Cerebrovascular accident (CVA), unspecified mechanism [facility])    Rx / DC Orders ED Discharge Orders     None        Virgina Norfolk, DO 12/03/20 1615

## 2020-12-03 NOTE — H&P (Addendum)
History and Physical    Bridget [name] OFB:[id number] DOB: [birth year] DOA: 12/03/2020  PCP: Glori Bickers, MD Consultants:  None Patient coming from:  Home - lives with daughter and husband, moved to [facility] from [place] about 1 year ago; NOK: Daughter, [phone]; SIL, [phone]  Chief Complaint: Neurologic deficits, d/c for CVA yesterday  HPI: Bridget [name] is a 87 y.o. female with medical history significant of chronic diastolic CHF; COPD; stage 3 CKD; HTN; mild dementia; afib started on Eliquis 2 days ago presenting with new stroke symptoms.  She was hospitalized for CVA and discharged yesterday.  She had a great night.  This AM, she seemed to be ok, was waiting for her coffee.  They went out on the porch and she asked for something to eat.  Ate a little and then she appeared to fall asleep with mildly shallow breathing.  Her left hand started "vibrating so much, like a seizure or something."  She woke up and felt very hot and called for help.  The wheeled her back into the house and called 911.  It was just like last week, "had gone blind again."  She also had LUE weakness.  During the last stroke, she was found on her bed with complete vision loss.  She regained vision after receiving TPA; the next day her vision was mostly restored.  She had also progressed from mild to moderate dementia at the time of d/c.  No N/W/T at the time of d/c.  Today, she was unable to sense that the EMT was holding her hand.    At the time I entered the room, the patient did appear to have vision loss.  However, during the evaluation it was difficult to tell whether it was true visual impairment or whether it was related to her dementia.  She had excellent strength in all extremities and was very animated in her conversation.  However, near the end of the visit, she faded a bit and appeared more fatigued.  She reported not feeling well but was unable to describe the issue.  She denied headache and still was able to  participate and appeared to be able to fix her gaze.     ED Course: Back with another stroke.  Code stroke called.  Discharged yesterday from neurology service.  Neurology service recommends no further work up and SNF placement.  Review of Systems: As per HPI; otherwise review of systems reviewed and negative.  Limited by cognitive impairment  Ambulatory Status:  Ambulates without assistive device (assistance from family only)  COVID Vaccine Status:  None   Past Medical History:  Diagnosis Date   (HFpEF) heart failure with preserved ejection fraction (HCC)    a. 08/2020 Echo: EF 60-65%, no rwma, Gr1 DD, nl RV fxn. Mildly dil RA. Mild MR/AI.   Asthma    CKD (chronic kidney disease), stage III (HCC)    COPD (chronic obstructive pulmonary disease) (HCC)    Falls    Hypertension    Mild Dementia (HCC)    Permanent atrial fibrillation (HCC)    a. CHA2DS2VASc = 5-->no OAC 2/2 unsteady gait/falls.   Unsteady gait     Past Surgical History:  Procedure Laterality Date   APPENDECTOMY     BREAST SURGERY     HERNIA REPAIR      Social History   Socioeconomic History   Marital status: Widowed    Spouse name: Not on file   Number of children: Not on file  Years of education: Not on file   Highest education level: Not on file  Occupational History   Occupation: retired  Tobacco Use   Smoking status: Former    Pack years: 0.00   Smokeless tobacco: Never   Tobacco comments:    34 pack year history  Substance and Sexual Activity   Alcohol use: Never   Drug use: Never   Sexual activity: Not Currently    Birth control/protection: Post-menopausal  Other Topics Concern   Not on file  Social History Narrative   Not on file   Social Determinants of Health   Financial Resource Strain: Not on file  Food Insecurity: Not on file  Transportation Needs: Not on file  Physical Activity: Not on file  Stress: Not on file  Social Connections: Not on file  Intimate Partner Violence:  Not on file    No Known Allergies  Family History  Problem Relation Age of Onset   Immunocompromised Neg Hx     Prior to Admission medications   Medication Sig Start Date End Date Taking? Authorizing Provider  albuterol (VENTOLIN HFA) 108 (90 Base) MCG/ACT inhaler Inhale 2 puffs into the lungs every 6 (six) hours as needed for wheezing or shortness of breath. 12/02/20   Merry Lofty, NP  apixaban (ELIQUIS) 2.5 MG TABS tablet Take 1 tablet (2.5 mg total) by mouth 2 (two) times daily. 12/02/20   Merry Lofty, NP  atorvastatin (LIPITOR) 10 MG tablet Take 1 tablet (10 mg total) by mouth at bedtime. 11/10/20   Alford Highland, MD  bisoprolol (ZEBETA) 5 MG tablet Take 1 tablet (5 mg total) by mouth daily. 11/10/20   Alford Highland, MD  furosemide (LASIX) 40 MG tablet Take 1 tablet (40 mg total) by mouth daily. 11/19/20   Leatha Gilding, MD  ipratropium-albuterol (DUONEB) 0.5-2.5 (3) MG/3ML SOLN Take 3 mLs by nebulization 4 (four) times daily. Patient taking differently: Take 3 mLs by nebulization every 6 (six) hours as needed (sob/wheezing). 11/10/20   Alford Highland, MD    Physical Exam: Vitals:   12/03/20 1545 12/03/20 1622 12/03/20 1659  BP: 108/62  115/63  Pulse: 66 68 79  Resp: 14 (!) 21 16  SpO2: 96% 98% 97%     General:  Appears calm and comfortable and is in NAD Eyes:   normal lids, iris - ?visual impairment, hard to quantify ENT:  grossly normal hearing, lips & tongue, mmm Neck:  no LAD, masses or thyromegaly Cardiovascular:  RRR, no m/r/g. No LE edema.  Respiratory:   CTA bilaterally with no wheezes/rales/rhonchi.  Normal respiratory effort. Abdomen:  soft, NT, ND Skin:  no rash or induration seen on limited exam Musculoskeletal:  grossly normal tone BUE/BLE, good ROM, no bony abnormality Lower extremity:  No LE edema.  Limited foot exam with no ulcerations.  2+ distal pulses. Psychiatric:  grossly normal mood and affect, speech fluent and appropriate,  AOx1 Neurologic:  CN 2-12 grossly intact, moves all extremities in coordinated fashion, sensation intact    Radiological Exams on Admission: Independently reviewed - see discussion in A/P where applicable  CT HEAD CODE STROKE WO CONTRAST  Result Date: 12/03/2020 CLINICAL DATA:  Code stroke.  Acute neuro deficit.  Recent stroke. EXAM: CT HEAD WITHOUT CONTRAST TECHNIQUE: Contiguous axial images were obtained from the base of the skull through the vertex without intravenous contrast. COMPARISON:  CT head 11/28/2020, MRI head 11/27/2020 FINDINGS: Brain: Progressive cytotoxic edema in the right occipital lobe compatible with recent infarction.  This was present previously but appears larger in territory which may be due to progressive edema or extension of infarction. No associated hemorrhage Cytotoxic edema infarct in the left occipital lobe with hemorrhagic transformation similar to the prior study. No new hemorrhage Recent infarct in the right lateral cerebellum best seen on MRI. No definite change. Generalized atrophy. Chronic microvascular ischemic changes throughout the white matter as noted previously. Vascular: Negative for hyperdense vessel Skull: No acute abnormality Sinuses/Orbits: Multiple air-fluid levels in the sinuses. No acute orbital abnormality. Left cataract extraction. Other: None IMPRESSION: 1. Right PCA infarct shows progressive edema. This may be due to evolving infarct versus extension of infarct. No associated hemorrhage in the right occipital lobe. 2. Infarct in the left occipital lobe with hemorrhagic transformation unchanged from the recent CT. 3. Recent infarct in the right cerebellum appears stable. Electronically Signed   By: Marlan Palau M.D.   On: 12/03/2020 15:11   CT ANGIO HEAD CODE STROKE  Result Date: 12/03/2020 CLINICAL DATA:  Neuro deficit, acute, stroke suspected. Stroke/TIA, assess extracranial arteries. EXAM: CT ANGIOGRAPHY HEAD AND NECK TECHNIQUE: Multidetector CT  imaging of the head and neck was performed using the standard protocol during bolus administration of intravenous contrast. Multiplanar CT image reconstructions and MIPs were obtained to evaluate the vascular anatomy. Carotid stenosis measurements (when applicable) are obtained utilizing NASCET criteria, using the distal internal carotid diameter as the denominator. CONTRAST:  75mL OMNIPAQUE IOHEXOL 350 MG/ML SOLN COMPARISON:  Noncontrast head CT examinations 12/03/2020 and earlier. CT angiogram head/neck 11/26/2020. FINDINGS: CTA NECK FINDINGS Aortic arch: Common origin of the innominate and left common carotid arteries. Atherosclerotic plaque within the visualized aortic arch and proximal major branch vessels of the neck. No hemodynamically significant innominate or proximal subclavian artery stenosis. Right carotid system: CCA and ICA patent within the neck. As before, there is soft and calcified plaque within the carotid bifurcation proximal ICA with less than 50% stenosis of the proximal ICA. Left carotid system: CCA and ICA patent within the neck. As before, there is scattered soft calcified plaque within the mid to distal CCA and within the carotid bifurcation. No measurable stenosis of the proximal left ICA. Vertebral arteries: Codominant and patent within the neck. Nonstenotic atherosclerotic plaque at both vertebral artery origins. Skeleton: No acute bony abnormality or aggressive osseous lesion. Cervical spondylosis Other neck: No neck mass or cervical lymphadenopathy. Upper chest: No consolidation within the imaged lung apices. Biapical pleuroparenchymal scarring. Calcified granuloma within the periphery of the right upper lobe. Review of the MIP images confirms the above findings CTA HEAD FINDINGS Evaluation of the intracranial arterial circulation is limited due to poor arterial contrast-enhancement. Anterior circulation: The intracranial internal carotid arteries are patent. Calcified plaque within  both vessels with up to moderate stenosis within the distal cavernous/paraclinoid segments bilaterally. The M1 middle cerebral arteries are patent. Within described limitations, no definite proximal M2 branch occlusion or high-grade proximal M2 stenosis is identified. The anterior cerebral arteries are patent. Posterior circulation: The intracranial vertebral arteries are patent. Unchanged calcified plaque within the V4 right vertebral artery with resultant up to moderate stenosis. The basilar artery is patent. New from the prior examination, there is occlusion of a right PCA branch at the P2 segment level (series 13, image 24). (Series 11, image 20). The left posterior cerebral artery is patent. Severe stenosis within the proximal P3 left PCA, not definitively present on the prior CTA of 11/26/2020. Venous sinuses: Within the limitations of contrast timing, no convincing thrombus. Anatomic  variants: Posterior communicating arteries are hypoplastic or absent bilaterally. Review of the MIP images confirms the above findings IMPRESSION: CTA neck: The common carotid, internal carotid and vertebral arteries are patent within the neck without hemodynamically significant stenosis (50% or greater). Arterial atherosclerotic disease within the neck, as described. CTA head: 1. Evaluation is limited due to poor intracranial arterial contrast enhancement. 2. However, a new right PCA branch occlusion is identified (at the P2 segment level). 3. Additionally, there is a severe stenosis within the proximal P3 segment of the left posterior cerebral artery, which was not definitively present on the prior CTA of 11/26/2020. 4. Additional intracranial atherosclerotic disease, as described, with sites of up to moderate stenosis in the bilateral intracranial ICAs and V4 right vertebral artery. Electronically Signed   By: Jackey Loge DO   On: 12/03/2020 15:56   CT ANGIO NECK CODE STROKE  Result Date: 12/03/2020 CLINICAL DATA:  Neuro  deficit, acute, stroke suspected. Stroke/TIA, assess extracranial arteries. EXAM: CT ANGIOGRAPHY HEAD AND NECK TECHNIQUE: Multidetector CT imaging of the head and neck was performed using the standard protocol during bolus administration of intravenous contrast. Multiplanar CT image reconstructions and MIPs were obtained to evaluate the vascular anatomy. Carotid stenosis measurements (when applicable) are obtained utilizing NASCET criteria, using the distal internal carotid diameter as the denominator. CONTRAST:  75mL OMNIPAQUE IOHEXOL 350 MG/ML SOLN COMPARISON:  Noncontrast head CT examinations 12/03/2020 and earlier. CT angiogram head/neck 11/26/2020. FINDINGS: CTA NECK FINDINGS Aortic arch: Common origin of the innominate and left common carotid arteries. Atherosclerotic plaque within the visualized aortic arch and proximal major branch vessels of the neck. No hemodynamically significant innominate or proximal subclavian artery stenosis. Right carotid system: CCA and ICA patent within the neck. As before, there is soft and calcified plaque within the carotid bifurcation proximal ICA with less than 50% stenosis of the proximal ICA. Left carotid system: CCA and ICA patent within the neck. As before, there is scattered soft calcified plaque within the mid to distal CCA and within the carotid bifurcation. No measurable stenosis of the proximal left ICA. Vertebral arteries: Codominant and patent within the neck. Nonstenotic atherosclerotic plaque at both vertebral artery origins. Skeleton: No acute bony abnormality or aggressive osseous lesion. Cervical spondylosis Other neck: No neck mass or cervical lymphadenopathy. Upper chest: No consolidation within the imaged lung apices. Biapical pleuroparenchymal scarring. Calcified granuloma within the periphery of the right upper lobe. Review of the MIP images confirms the above findings CTA HEAD FINDINGS Evaluation of the intracranial arterial circulation is limited due to  poor arterial contrast-enhancement. Anterior circulation: The intracranial internal carotid arteries are patent. Calcified plaque within both vessels with up to moderate stenosis within the distal cavernous/paraclinoid segments bilaterally. The M1 middle cerebral arteries are patent. Within described limitations, no definite proximal M2 branch occlusion or high-grade proximal M2 stenosis is identified. The anterior cerebral arteries are patent. Posterior circulation: The intracranial vertebral arteries are patent. Unchanged calcified plaque within the V4 right vertebral artery with resultant up to moderate stenosis. The basilar artery is patent. New from the prior examination, there is occlusion of a right PCA branch at the P2 segment level (series 13, image 24). (Series 11, image 20). The left posterior cerebral artery is patent. Severe stenosis within the proximal P3 left PCA, not definitively present on the prior CTA of 11/26/2020. Venous sinuses: Within the limitations of contrast timing, no convincing thrombus. Anatomic variants: Posterior communicating arteries are hypoplastic or absent bilaterally. Review of the MIP images confirms  the above findings IMPRESSION: CTA neck: The common carotid, internal carotid and vertebral arteries are patent within the neck without hemodynamically significant stenosis (50% or greater). Arterial atherosclerotic disease within the neck, as described. CTA head: 1. Evaluation is limited due to poor intracranial arterial contrast enhancement. 2. However, a new right PCA branch occlusion is identified (at the P2 segment level). 3. Additionally, there is a severe stenosis within the proximal P3 segment of the left posterior cerebral artery, which was not definitively present on the prior CTA of 11/26/2020. 4. Additional intracranial atherosclerotic disease, as described, with sites of up to moderate stenosis in the bilateral intracranial ICAs and V4 right vertebral artery.  Electronically Signed   By: Jackey Loge DO   On: 12/03/2020 15:56    EKG: not done   Labs on Admission: I have personally reviewed the available labs and imaging studies at the time of the admission.  Pertinent labs:   BUN 18/Creatinine 0.92/GFR 58 - stable Albumin 3.1 Unremarkable CBC INR 1.4 A1c 6.3 COVID/flu negative on 6/17 Lipids on 6/17: 120/39/68/64   Assessment/Plan Principal Problem:   Acute CVA (cerebrovascular accident) [facility]) Active Problems:   Essential hypertension   Atrial fibrillation, chronic (HCC)   CKD (chronic kidney disease), stage III (HCC)   Chronic diastolic CHF (congestive heart failure) (HCC)   Dementia with behavioral disturbance (HCC)   Goals of care, counseling/discussion   CVA -Patient previously admitted for B posterior cerebral artery strokes with new cortical blindness, discharged from the neurology service yesterday -She returned today with new transient weakness, vision loss which appear to be improving -CTA with right P3 branch occlusion that is new, likely related to afib (started on Eliquis prior to d/c) -Not a candidate for tPA or endovascular therapy -Seen by neurology, but recommends repeat MRI to measure the new infarct but no further CVA work-up -Will admit for further evaluation and to further discussion goals of care -Telemetry monitoring -MRI -EEG -Neurology consult -PT/OT/ST/Nutrition Consults -CIR consult for possible placement; if not a candidate, consider SNF rehab vs. Palliative/hospice  HTN -Allow permissive HTN for now -Treat BP only if >220/120, and then with goal of 15% reduction -Hold Bisoprolol and plan to restart in 48-72 hours  HLD -Continue Lipitor 10 mg daily   Chronic diastolic CHF -Appears to be compensated -Hold Lasix for now  COPD  -prn Duoneb  Stage 3a CKD -Appears to be stable  Dementia -Appears to have waxing and waning of stroke symptoms, which may be more consistent with  dementia -Reported to have progressed from mild to moderate, definitely appears to have moderate impairment currently -Continue Aricept -Family counseled about how patients with dementia often do better behaviorally while hospitalized if family members are present overnight   Afib -Continue Eliquis -Holding bisoprolol so will need to monitor HR  Goals of Care -Spent a long time discussing GOC with family; her daughter is very reasonable -Ideally, patient can go to CIR to regain some independence and return home -Alternatively, SNF rehab may be a consideration -Palliative care consultation requested -We discussed whether to even perform the MRI but her daughter would like to have these results to help drive her decision making moving forward -Patient is DNR and will need a gold form at the time of d/c   Note: This patient has been tested and was previously negative for the novel coronavirus COVID-19; she is being retested today. She has NOT been vaccinated against COVID-19.    DVT prophylaxis:  Eliquis Code Status:  DNR - confirmed with family Family Communication: Daughter/Son-in-law present throughout evaluation Disposition Plan:  The patient is from: home  Anticipated d/c is to: CIR, ideally  Anticipated d/c date will depend on clinical response to treatment, but likely as soon as CIR bed is available  Patient is currently: acutely ill Consults called: Neurology; CIR; PT/OT/ST/Nutrition; TOC team Admission status: Admit - It is my clinical opinion that admission to INPATIENT is reasonable and necessary because of the expectation that this patient will require hospital care that crosses at least 2 midnights to treat this condition based on the medical complexity of the problems presented.  Given the aforementioned information, the predictability of an adverse outcome is felt to be significant.     Jonah BlueJennifer Tahtiana Rozier MD [facility]   How to contact the [facility] Attending or  Consulting provider 7A - 7P or covering provider during after hours 7P -7A, for this patient?  Check the care team in [facility] and look for a) attending/consulting [facility] provider listed and b) the [facility] team listed Log into [email or web address] and use [facility]'s universal password to access. If you do not have the password, please contact the hospital operator. Locate the [facility] provider you are looking for under [facility] and page to a number that you can be directly reached. If you still have difficulty reaching the provider, please page the [facility] (Director on Call) for the Hospitalists listed on amion for assistance.   12/03/2020, 5:43 PM

## 2020-12-04 ENCOUNTER — Inpatient Hospital Stay (HOSPITAL_COMMUNITY): Payer: Medicare Other

## 2020-12-04 DIAGNOSIS — Z7189 Other specified counseling: Secondary | ICD-10-CM

## 2020-12-04 DIAGNOSIS — I639 Cerebral infarction, unspecified: Secondary | ICD-10-CM

## 2020-12-04 DIAGNOSIS — Z66 Do not resuscitate: Secondary | ICD-10-CM

## 2020-12-04 DIAGNOSIS — Z515 Encounter for palliative care: Secondary | ICD-10-CM

## 2020-12-04 MED ORDER — ATORVASTATIN CALCIUM 10 MG PO TABS
20.0000 mg | ORAL_TABLET | Freq: Every day | ORAL | Status: DC
  Administered 2020-12-04 – 2020-12-05 (×2): 20 mg via ORAL
  Filled 2020-12-04 (×2): qty 2

## 2020-12-04 MED ORDER — ADULT MULTIVITAMIN W/MINERALS CH
1.0000 | ORAL_TABLET | Freq: Every day | ORAL | Status: DC
  Administered 2020-12-04 – 2020-12-06 (×3): 1 via ORAL
  Filled 2020-12-04 (×3): qty 1

## 2020-12-04 NOTE — ED Notes (Signed)
Patient will be off tele for about 5 minutes for transport.

## 2020-12-04 NOTE — ED Notes (Signed)
[facility] is not answering the phone at this time. Will try to call report again later.

## 2020-12-04 NOTE — Consult Note (Signed)
Consultation Note Date: 12/04/2020   Patient Name: Bridget [name]  DOB: May 11, 1926  MRN: [id number]  Age / Sex: 87 y.o., female  PCP: Glori Bickers, MD Referring Physician: Verdia Kuba, DO  Reason for Consultation: Establishing goals of care  HPI/Patient Profile: 87 y.o. female  with past medical history of chronic diastolic CHF, COPD, stage 3 CKD, HTN, mild dementia, frequent falls, and afib admitted on 12/03/2020 with new stroke symptoms. She was hospitalized for stroke 6/16-6/22 (just dc'd day prior to admission). Found to have vision loss and LUE weakness. CTA reveaeled new R P3 branch occlusion. No further CVA work up planned. Family hopeful for CIR placement. PMT consulted to discuss GOC.   Clinical Assessment and Goals of Care: I have reviewed medical records including EPIC notes, labs and imaging, assessed the patient and then spoke with patient's daughter [name]  to discuss diagnosis prognosis, GOC, EOL wishes, disposition and options.  On assessment of patient she is alert and very interactive, quite pleasant. Denies any discomfort or concerns. Unable to discuss goals of care. Spoke with daughter via telephone.   I introduced Palliative Medicine as specialized medical care for people living with serious illness. It focuses on providing relief from the symptoms and stress of a serious illness. The goal is to improve quality of life for both the patient and the family.  We discussed a brief life review of the patient. Daughter shares that patient lives with her and her spouse - they are her primary caregivers.   As far as functional and nutritional status, [name] shares of a decline since her previous stroke - was functionally independent prior to last hospitalization. Now has some weakness, unable to toilet independently.    We discussed patient's current illness and what it means in the larger context of patient's on-going  co-morbidities. We reviewed her MRI per daughter's request.  I attempted to elicit values and goals of care important to the patient.  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  The difference between aggressive medical intervention and comfort care was considered in light of the patient's goals of care.   Advance directives, concepts specific to code status, artificial feeding and hydration, and rehospitalization were considered and discussed.  I completed a MOST form today. The patient and family outlined their wishes for the following treatment decisions:  Cardiopulmonary Resuscitation: Do Not Attempt Resuscitation (DNR/No CPR)  Medical Interventions: Limited Additional Interventions: Use medical treatment, IV fluids and cardiac monitoring as indicated, DO NOT USE intubation or mechanical ventilation. May consider use of less invasive airway support such as BiPAP or CPAP. Also provide comfort measures. Transfer to the hospital if indicated. Avoid intensive care.   Antibiotics: Antibiotics if indicated  IV Fluids: IV fluids if indicated  Feeding Tube: No feeding tube    Discussed with family the importance of continued conversation with family and the medical providers regarding overall plan of care and treatment options, ensuring decisions are within the context of the patient's values and GOCs.    Hospice  services outpatient were explained. Discussed when this may be appropriate.  At this point family is hopeful for CIR placement for some functional improvement.   Questions and concerns were addressed. The family was encouraged to call with questions or concerns.   Primary Decision Maker NEXT OF KIN - daughter Dolores Hoose  SUMMARY OF RECOMMENDATIONS   - MOST completed as above: DNR, NO feeding tube, okay with repeated hospitalizations, antibiotics, and IV fluids - family  hopeful for CIR placement at this point - will follow  Code Status/Advance Care  Planning: DNR  Prognosis:  Unable to determine  Discharge Planning: To Be Determined - family hopeful for CIR     Primary Diagnoses: Present on Admission:  Acute CVA (cerebrovascular accident) (HCC)  Essential hypertension  Atrial fibrillation, chronic (HCC)  CKD (chronic kidney disease), stage III (HCC)  Chronic diastolic CHF (congestive heart failure) (HCC)  Dementia with behavioral disturbance (HCC)   I have reviewed the medical record, interviewed the patient and family, and examined the patient. The following aspects are pertinent.  Past Medical History:  Diagnosis Date   (HFpEF) heart failure with preserved ejection fraction (HCC)    a. 08/2020 Echo: EF 60-65%, no rwma, Gr1 DD, nl RV fxn. Mildly dil RA. Mild MR/AI.   Asthma    CKD (chronic kidney disease), stage III (HCC)    COPD (chronic obstructive pulmonary disease) (HCC)    Falls    Hypertension    Mild Dementia (HCC)    Permanent atrial fibrillation (HCC)    a. CHA2DS2VASc = 5-->no OAC 2/2 unsteady gait/falls.   Unsteady gait    Social History   Socioeconomic History   Marital status: Widowed    Spouse name: Not on file   Number of children: Not on file   Years of education: Not on file   Highest education level: Not on file  Occupational History   Occupation: retired  Tobacco Use   Smoking status: Former    Pack years: 0.00   Smokeless tobacco: Never   Tobacco comments:    34 pack year history  Substance and Sexual Activity   Alcohol use: Never   Drug use: Never   Sexual activity: Not Currently    Birth control/protection: Post-menopausal  Other Topics Concern   Not on file  Social History Narrative   Not on file   Social Determinants of Health   Financial Resource Strain: Not on file  Food Insecurity: Not on file  Transportation Needs: Not on file  Physical Activity: Not on file  Stress: Not on file  Social Connections: Not on file   Family History  Problem Relation Age of Onset    Immunocompromised Neg Hx    Scheduled Meds:  apixaban  2.5 mg Oral BID   atorvastatin  10 mg Oral QHS   Continuous Infusions:  sodium chloride 50 mL/hr at 12/03/20 2027   PRN Meds:.acetaminophen **OR** acetaminophen (TYLENOL) oral liquid 160 mg/5 mL **OR** acetaminophen, ipratropium-albuterol, senna-docusate No Known Allergies Review of Systems  Unable to perform ROS: Dementia   Physical Exam Constitutional:      General: She is not in acute distress. Pulmonary:     Effort: Pulmonary effort is normal.  Skin:    General: Skin is warm and dry.  Neurological:     Mental Status: She is alert. She is disoriented.  Psychiatric:        Mood and Affect: Mood normal.        Behavior: Behavior is cooperative.        Cognition and Memory: Cognition is impaired. Memory is impaired.        Judgment: Judgment is inappropriate.    Vital Signs: BP 133/80   Pulse 81   Temp 98.3 F (36.8 C) (Oral)   Resp 19   SpO2 98%  Pain Scale: 0-10   Pain Score: 0-No pain   SpO2: SpO2: 98 % O2 Device:SpO2: 98 % O2 Flow Rate: .  IO: Intake/output summary: No intake or output data in the 24 hours ending 12/04/20 1312  LBM:   Baseline Weight:   Most recent weight:       Palliative Assessment/Data: PPS 40%    Time Total: 65 minutes Greater than 50%  of this time was spent counseling and coordinating care related to the above assessment and plan.  Gerlean Ren, DNP, AGNP-C Palliative Medicine Team [phone] Pager: [phone]

## 2020-12-04 NOTE — Procedures (Signed)
Patient Name: [name]  MRN: [id number]  Epilepsy Attending: Charlsie Quest  Referring Physician/Provider: Dr. Jonah Blue Date: 12/04/2020 Duration: 23.41 minutes  Patient history: 87 year old female with recent stroke now with worsening weakness and vision loss.  EEG to evaluate for seizures.  Level of alertness: Awake  AEDs during EEG study: None  Technical aspects: This EEG study was done with scalp electrodes positioned according to the 10-20 International system of electrode placement. Electrical activity was acquired at a sampling rate of 500Hz  and reviewed with a high frequency filter of 70Hz  and a low frequency filter of 1Hz . EEG data were recorded continuously and digitally stored.   Description: The posterior dominant rhythm consists of 8 Hz activity of moderate voltage (25-35 uV) seen predominantly in posterior head regions, symmetric and reactive to eye opening and eye closing.  Intermittent generalized 3 to 5 Hz theta-delta slowing was also noted. Hyperventilation and photic stimulation were not performed.     ABNORMALITY - Intermittent slow, generalized  IMPRESSION: This study is suggestive of mild diffuse encephalopathy, nonspecific etiology. No seizures or epileptiform discharges were seen throughout the recording.  Bridget [name] 

## 2020-12-04 NOTE — ED Notes (Signed)
Safety sitter at the bedside. °

## 2020-12-04 NOTE — Evaluation (Signed)
Occupational Therapy Evaluation Patient Details Name: Bridget [name] MRN: [id number] DOB: 1925/09/23 Today's Date: 12/04/2020    History of Present Illness Pt is a 87 y/o female admitted 6/23 secondary to worsening visual deficits and L sided weakness. Pt with recent admission for CVA with similar deficits. CTA revealed new R P2 branch occlusion. MRI showed Continued interval evolution of the subacute right PCA and L PCA  distribution infarct. PMH includes CVA, HTN, a fib, CKD, dCHF, dementia, and COPD.   Clinical Impression   Patient with readmit for the diagnosis above.  PTA she lived at home with her daughter, and had a PCA that assisted with ADL and IADL.  Daughter is able to provide oversight and assist as needed.  Barriers are listed below.  Patient's visual field deficit to the R is very close to what it was prior to her discharge.  The patient is using her L UE functionally, with no noticeable deficits.  She will need cueing and oversight to scan her environment to reduce falls, and trip hazards.  She should do better in her familiar environment.  OT recommends home with 24 hour hour supervision and assist as needed.  OT will follow in the acute environment to maximize her functional status.      Follow Up Recommendations  Home health OT;Supervision/Assistance - 24 hour    Equipment Recommendations  3 in 1 bedside commode    Recommendations for Other Services       Precautions / Restrictions Precautions Precautions: Fall Precaution Comments: R field cut/hemianopsia Restrictions Weight Bearing Restrictions: No      Mobility Bed Mobility Overal bed mobility: Needs Assistance Bed Mobility: Supine to Sit;Sit to Supine     Supine to sit: Min assist Sit to supine: Min assist   General bed mobility comments: up in recliner Patient Response: Cooperative  Transfers Overall transfer level: Needs assistance Equipment used: 1 person hand held assist Transfers: Sit to/from  Stand Sit to Stand: Min guard         General transfer comment: min guard for safety to stand from ED stretcher    Balance Overall balance assessment: Needs assistance Sitting-balance support: Feet supported;No upper extremity supported Sitting balance-Leahy Scale: Good     Standing balance support: Single extremity supported Standing balance-Leahy Scale: Fair Standing balance comment: able to perform static standing without UE support                           ADL either performed or assessed with clinical judgement   ADL Overall ADL's : Needs assistance/impaired     Grooming: Wash/dry hands;Min guard;Standing           Upper Body Dressing : Min guard;Sitting   Lower Body Dressing: Min guard;Sit to/from stand   Toilet Transfer: Minimal Dentist Details (indicate cue type and reason): HHA Toileting- Clothing Manipulation and Hygiene: Supervision/safety;Sitting/lateral lean Toileting - Clothing Manipulation Details (indicate cue type and reason): needed help with toilet paper dispenser.     Functional mobility during ADLs: Minimal assistance General ADL Comments: HHA     Vision Patient Visual Report: Peripheral vision impairment Visual Fields: Right visual field deficit;Right homonymous hemianopsia     Perception  [facility]   Praxis  Intact    Pertinent Vitals/Pain Pain Assessment: No/denies pain     Hand Dominance Right   Extremity/Trunk Assessment Upper Extremity Assessment Upper Extremity Assessment: Generalized weakness   Lower Extremity Assessment Lower Extremity Assessment: Defer  to PT evaluation   Cervical / Trunk Assessment Cervical / Trunk Assessment: Kyphotic   Communication Communication Communication: No difficulties   Cognition Arousal/Alertness: Awake/alert Behavior During Therapy: WFL for tasks assessed/performed Overall Cognitive Status: History of cognitive impairments - at baseline                                  General Comments: Needs constant cues, and will ask what's next.   General Comments     Exercises     Shoulder Instructions      Home Living Family/patient expects to be discharged to:: Private residence Living Arrangements: Children Available Help at Discharge: Family;Available 24 hours/day Type of Home: House Home Access: Stairs to enter [employer] of Steps: 1 Entrance Stairs-Rails: Left Home Layout: One level     Bathroom Shower/Tub: Chief Strategy Officer: Standard     Home Equipment: Environmental consultant - 2 wheels;Cane - single point;Grab bars - tub/shower;Walker - 4 wheels;Wheelchair - manual   Additional Comments: Reports she has been staying with lisa  Lives With: Daughter    Prior Functioning/Environment Level of Independence: Needs assistance  Gait / Transfers Assistance Needed: Pt reports she normally uses cane to walk ADL's / Homemaking Assistance Needed: Per PT: aide comes daily to assist with dressing, bathing, and general mobility and anything else she may need.  PCA is 2x/wk for up to 3 hours.   Comments: Daughter works from home and can assist as needed        OT Problem List: Decreased strength;Decreased activity tolerance;Decreased knowledge of use of DME or AE;Impaired vision/perception;Impaired balance (sitting and/or standing)      OT Treatment/Interventions: Self-care/ADL training;DME and/or AE instruction;Therapeutic activities;Therapeutic exercise;Energy conservation;Patient/family education    OT Goals(Current goals can be found in the care plan section) Acute Rehab OT Goals Patient Stated Goal: Hoping to go home by tomorrow OT Goal Formulation: With patient Time For Goal Achievement: 12/18/20 Potential to Achieve Goals: Good ADL Goals Pt Will Perform Grooming: with supervision;sitting;standing Pt Will Perform Upper Body Bathing: with supervision;sitting Pt Will Perform Lower Body Bathing: with  supervision;sit to/from stand Pt Will Perform Upper Body Dressing: with supervision;sitting Pt Will Perform Lower Body Dressing: with supervision;sit to/from stand Pt Will Transfer to Toilet: with supervision;ambulating Pt Will Perform Toileting - Clothing Manipulation and hygiene: sitting/lateral leans;with modified independence  OT Frequency: Min 2X/week   Barriers to D/C:    none noted       Co-evaluation              AM-PAC OT "6 Clicks" Daily Activity     Outcome Measure Help from another person eating meals?: None Help from another person taking care of personal grooming?: A Little Help from another person toileting, which includes using toliet, bedpan, or urinal?: A Little Help from another person bathing (including washing, rinsing, drying)?: A Little Help from another person to put on and taking off regular upper body clothing?: A Little Help from another person to put on and taking off regular lower body clothing?: A Little 6 Click Score: 19   End of Session Nurse Communication: Mobility status  Activity Tolerance: Patient tolerated treatment well Patient left: in chair;with call bell/phone within reach;with nursing/sitter in room  OT Visit Diagnosis: Unsteadiness on feet (R26.81);Muscle weakness (generalized) (M62.81);Other symptoms and signs involving cognitive function                Time:  1500-1520 OT Time Calculation (min): 20 min Charges:  OT General Charges $OT Visit: 1 Visit OT Evaluation $OT Eval Moderate Complexity: 1 Mod  12/04/2020  Rich, OTR/L  Acute Rehabilitation Services  Office:  [phone]   Suzanna Obey 12/04/2020, 5:29 PM

## 2020-12-04 NOTE — ED Notes (Signed)
Up to the bedside commode with one assist.

## 2020-12-04 NOTE — ED Notes (Signed)
EEG at patient bedside.

## 2020-12-04 NOTE — Progress Notes (Addendum)
STROKE TEAM PROGRESS NOTE   INTERVAL HISTORY Her RN is at the bedside. Seen while still in ER awaiting a bed upstairs. Her vision and left-sided weakness has much improved.  Now she is able to count fingers on the left side.  She has persistent right homonymous hemianopsia.  Stroke wk up will be limited this admit as she just had full work up upon hospitalization 6/16-6/22. MRI present admission does not show any definite new strokes but shows subacute bilateral PCA infarcts without significant change compared with previous MRI 1 week ago. Vitals:   12/04/20 1045 12/04/20 1145 12/04/20 1417 12/04/20 1546  BP: (!) 129/100 133/80 130/78 130/71  Pulse: 86 81 80 84  Resp: 20 19 18 16   Temp:   98.1 F (36.7 C) (!) 97.5 F (36.4 C)  TempSrc:   Oral   SpO2: 97% 98% 98% 95%   CBC:  Recent Labs  Lab 12/03/20 1452 12/03/20 1457  WBC 7.7  --   NEUTROABS 4.7  --   HGB 15.7* 16.3*  HCT 48.0* 48.0*  MCV 96.0  --   PLT 155  --    Basic Metabolic Panel:  Recent Labs  Lab 12/03/20 1452 12/03/20 1457  NA 136 136  K 3.7 3.5  CL 97* 97*  CO2 30  --   GLUCOSE 106* 103*  BUN 18 19  CREATININE 0.92 0.80  CALCIUM 8.8*  --    Lipid Panel: No results for input(s): CHOL, TRIG, HDL, CHOLHDL, VLDL, LDLCALC in the last 168 hours. HgbA1c: No results for input(s): HGBA1C in the last 168 hours. Urine Drug Screen: No results for input(s): LABOPIA, COCAINSCRNUR, LABBENZ, AMPHETMU, THCU, LABBARB in the last 168 hours.  Alcohol Level No results for input(s): ETH in the last 168 hours.  IMAGING past 24 hours MR BRAIN WO CONTRAST  Result Date: 12/03/2020 CLINICAL DATA:  Initial evaluation for stroke suspected. EXAM: MRI HEAD WITHOUT CONTRAST TECHNIQUE: Multiplanar, multiecho pulse sequences of the brain and surrounding structures were obtained without intravenous contrast. COMPARISON:  Prior CTs from earlier the same day as well as recent MRI from 11/27/2020. FINDINGS: Brain: Examination mildly degraded  by motion artifact. Diffuse prominence of the CSF containing spaces compatible generalized age-related cerebral atrophy. Patchy and confluent T2/FLAIR hyperintensity within the periventricular deep white matter both cerebral hemispheres as well as the pons, most consistent with chronic small vessel ischemic disease, moderately advanced in nature. There has been continued interval evolution of the subacute left PCA distribution infarct with associated hemorrhagic transformation. Appearance is within normal limits for normal expected interval evolution. Associated hemorrhage now demonstrates intrinsic T1 hyperintensity and is slightly smaller as compared to previous MRI, now measuring approximately 3.5 x 2.0 cm (series 8, image 26). No significant regional mass effect. Continued interval evolution of subacute right PCA distribution infarct. Area of involvement has slightly expanded as compared to previous MRI, with further lateral extension into peripheral right temporal occipital region as compared to previous (series 2, image 21). Overall appearance is subacute however, with no new superimposed areas of acute infarction. Associated mild petechial hemorrhage without frank hemorrhagic transformation or significant mass effect. Additional subacute cerebellar infarcts are overall similar in size from previous MRI, with no new areas of infarction. No associated hemorrhage or mass effect. Additional punctate focus of subacute ischemia at the anterior left frontal lobe noted as well, also stable from prior (series 2, image 37). There may be 1 new punctate 4 mm focus of diffusion abnormality involving the ventral medial  left thalamus, not entirely certain as this is only seen on axial DWI sequence (series 2, image 32), and not seen on corresponding coronal sequence or ADC map. No other new areas of acute ischemia. Gray-white matter differentiation otherwise maintained. No other evidence for acute intracranial hemorrhage.  Additional subcentimeter chronic microhemorrhage noted at the anterior left frontal corona radiata, likely small vessel related. No mass lesion, midline shift or mass effect. No hydrocephalus or extra-axial fluid collection. Pituitary gland suprasellar region within normal limits. Midline structures intact. Vascular: Major intracranial vascular flow voids are maintained at the skull base. Skull and upper cervical spine: Craniocervical junction within normal limits. Bone marrow signal intensity normal. No scalp soft tissue abnormality. Sinuses/Orbits: Patient status post ocular lens replacement on the left. Globes and orbital soft tissues demonstrate no acute finding. Scattered mucosal thickening noted throughout the sphenoid ethmoidal and maxillary sinuses. Superimposed small air-fluid levels noted within the maxillary sinuses. Right mastoid effusion noted. Visualized nasopharynx unremarkable. Inner ear structures grossly normal. Other: None. IMPRESSION: 1. Continued interval evolution of the subacute right PCA distribution infarct, slightly expanded in territory as compared to previous MRI, but overall subacute in appearance with no new areas of superimposed infarction. Associated mild petechial hemorrhage without hemorrhagic transformation. 2. Continued interval evolution of subacute left PCA distribution infarct with associated hemorrhagic transformation, overall decreased in size from previous. 3. Additional evolving subacute ischemic infarcts involving the left frontal white matter and cerebellum, overall stable in size and distribution. 4. Possible one new 4 mm acute ischemic infarct involving the ventromedial left thalamus as above, not entirely certain and could be artifactual in nature. 5. Underlying age-related cerebral atrophy with moderately advanced chronic microvascular ischemic disease 6. Acute ethmoidal and maxillary sinusitis. 7. Right mastoid effusion, similar to previous. Electronically Signed    By: Rise Mu M.D.   On: 12/03/2020 20:45   EEG adult  Result Date: 12/04/2020 Charlsie Quest, MD     12/04/2020  1:43 PM Patient Name: EBONY YORIO MRN: [id number] Epilepsy Attending: Charlsie Quest Referring Physician/Provider: Dr. Jonah Blue Date: 12/04/2020 Duration: 23.41 minutes Patient history: 87 year old female with recent stroke now with worsening weakness and vision loss.  EEG to evaluate for seizures. Level of alertness: Awake AEDs during EEG study: None Technical aspects: This EEG study was done with scalp electrodes positioned according to the 10-20 International system of electrode placement. Electrical activity was acquired at a sampling rate of 500Hz  and reviewed with a high frequency filter of 70Hz  and a low frequency filter of 1Hz . EEG data were recorded continuously and digitally stored. Description: The posterior dominant rhythm consists of 8 Hz activity of moderate voltage (25-35 uV) seen predominantly in posterior head regions, symmetric and reactive to eye opening and eye closing.  Intermittent generalized 3 to 5 Hz theta-delta slowing was also noted. Hyperventilation and photic stimulation were not performed.   ABNORMALITY - Intermittent slow, generalized IMPRESSION: This study is suggestive of mild diffuse encephalopathy, nonspecific etiology. No seizures or epileptiform discharges were seen throughout the recording. Priyanka    PHYSICAL EXAM General: Appears well-developed; no acute distress. Psych: Affect appropriate to situation Eyes: No scleral injection HENT: No OP obstrucion Head: Normocephalic.  Cardiovascular: Irregular; rate normal Respiratory: Effort normal and breath sounds normal to anterior ascultation GI: Soft.  No distension. There is no tenderness.  Skin: WDI    Neurological Examination Mental Status: Alert, oriented to name, dob, age, thought content appropriate for most part, but gets mixed up easily  on conversation. She know  this is the hospital, but not which one or what city. Says it is June, but 1997. Speech fluent without evidence of aphasia. Able to follow 3 step commands without difficulty. Cranial Nerves: II: mild right field cut; no cortical blindness III,IV, VI: ptosis not present, extra-ocular motions intact bilaterally, pupils equal, round, reactive to light and accommodation V,VII: smile symmetric, facial light touch sensation normal bilaterally VIII: hearing normal bilaterally IX,X: uvula rises symmetrically XI: bilateral shoulder shrug XII: midline tongue extension Motor:Tone and bulk:normal tone throughout for age. Able to kick both legs at once in the air without drift. Nearly equal grips, slight 4+ on left. Otherwise 5/5 Sensory: light touch intact throughout, bilaterally Deep Tendon Reflexes: 2+ and symmetric throughout Plantars: Right: downgoing   Left: downgoing Cerebellar: slight ataxia noted on FTN L<R. Slower on left for rapid alternating movements and normal heel-to-shin test Gait: did not test  ASSESSMENT/PLAN Ms. LYRICAL SOWLE is a 87 y.o. female with history of chronic AF on Eliquis for only 2 days, recent multifocal infarcts with residual left-sided weakness vision loss. She presents back to  the hospital with near cortical blindness and worse left side weakness.  However MRI scan does not show any significant new acute strokes and her vision loss has improved after admission  Stroke: No definite new stroke.  MRI shows continued evolution of know recent strokes, not clearly new stroke, yet difficult to appreciate given extensive recent strokes. Ddx: Pt may have decompensated d/t other cause or had a seizure.  Code Stroke CT head No acute abnormality. Evolution of know strokes, Small vessel disease. Atrophy.  CTA head & neck extensive severe stenosis in prox P3 and intracranial atherosclerotic dise ase previously described.  MRI  Multiple acute/subacute strokes, does not appear that  there are new strokes when compared to last weeks MRI.  2D Echo Just done last week, no need to repeat this admit LDL 68 HgbA1c 6.3 VTE prophylaxis - full anticoagulation on Eliquis    Diet   Diet regular Room service appropriate? Yes; Fluid consistency: Thin   Eliquis (apixaban) daily prior to admission, now on Eliquis (apixaban) daily.  Therapy recommendations:  pending Disposition:  pending  Hypertension Home meds:  Zebeta Stable Permissive hypertension (OK if < 220/120) but gradually normalize in 5-7 days Long-term BP goal normotensive  Hyperlipidemia Home meds:  Lipitor 10mg , resumed in hospital LDL 68, goal < 70 High intensity statin not indicated Continue statin at discharge  Diabetes type II Uncontrolled Home meds:  none HgbA1c 6.3, goal < 7.0 CBGs Recent Labs    12/03/20 1450  GLUCAP 105*    SSI  Other Stroke Risk Factors Advanced Age >/= 10  Hx stroke/TIA Family hx stroke Coronary artery disease HFpEF; Congestive heart failure- on lasix  Other Active Problems Chronic Afib- only started Eliquis 2 days ago (none previously) CKD3 Mild dementia Freq falls DNR/DNI  Hospital day # 1  Desiree Metzger-Cihelka, ARNP-C, ANVP-BC Pager: [phone]  I have personally obtained history,examined this patient, reviewed notes, independently viewed imaging studies, participated in medical decision making and plan of care.ROS completed by me personally and pertinent positives fully documented  I have made any additions or clarifications directly to the above note. Agree with note above.  Patient with recent bilateral posterior cerebral subacute infarcts presents with sudden onset of cortical blindness and transient left-sided weakness suspicious for new right PCA infarct however MRI scan does not show significant changes compared to to the previous MRI  from a week ago and his symptoms appear to be improving.  Possible unwitnessed seizure with postictal state however EEG  shows mild diffuse encephalopathy and no definite seizures..  Continue Eliquis for stroke prevention and ongoing therapy needs.  No need to repeat stroke work-up.  Discussed with her daughter at the bedside and answered questions.  Greater than 50% time during this 35-minute visit was spent in counseling and coordination of care and discussion with care team.  Discussed with Dr. Adair Patter, MD Medical Director [facility] Pager: [phone] 12/04/2020 5:33 PM  To contact Stroke Continuity provider, please refer to [email or web address]. After hours, contact General Neurology

## 2020-12-04 NOTE — Evaluation (Signed)
Physical Therapy Evaluation Patient Details Name: Bridget [name] MRN: [id number] DOB: Oct 23, 1925 Today's Date: 12/04/2020   History of Present Illness  Pt is a 87 y/o female admitted 6/23 secondary to worsening visual deficits and L sided weakness. Pt with recent admission for CVA with similar deficits. CTA revealed new R P2 branch occlusion. MRI showed Continued interval evolution of the subacute right PCA and L PCA  distribution infarct. PMH includes CVA, HTN, a fib, CKD, dCHF, dementia, and COPD.  Clinical Impression  Pt admitted secondary to problem above with deficits below. Pt requiring min A for bed mobility on stretcher and min guard A with HHA for transfers and gait within ED room. Per notes, pt with visual deficits, however, pt reports she is able to see fine. Did not run into any objects when performing gait tasks. Recommending HHPT and 24/7 support at d/c. Will continue to follow acutely.     Follow Up Recommendations Home health PT;Supervision/Assistance - 24 hour    Equipment Recommendations  3in1 (PT)    Recommendations for Other Services       Precautions / Restrictions Precautions Precautions: Fall Precaution Comments: bilateral vision deficits Restrictions Weight Bearing Restrictions: No      Mobility  Bed Mobility Overal bed mobility: Needs Assistance Bed Mobility: Supine to Sit;Sit to Supine     Supine to sit: Min assist Sit to supine: Min assist   General bed mobility comments: Required assist for trunk and LEs to perform bed mobility on stretcher.    Transfers Overall transfer level: Needs assistance Equipment used: 1 person hand held assist Transfers: Sit to/from Stand Sit to Stand: Min guard         General transfer comment: min guard for safety to stand from ED stretcher  Ambulation/Gait Ambulation/Gait assistance: Min guard Gait Distance (Feet): 20 Feet Assistive device: 1 person hand held assist Gait Pattern/deviations: Step-through  pattern;Decreased stride length;Shuffle Gait velocity: Decreased   General Gait Details: Very guarded, short steps. Min guard for safety and cues for sequencing. No overt LOB noted. Min guard for safety. Pt reporting weakness in BLE  Stairs            Wheelchair Mobility    Modified Rankin (Stroke Patients Only) Modified Rankin (Stroke Patients Only) Pre-Morbid Rankin Score: Moderately severe disability Modified Rankin: Moderately severe disability     Balance Overall balance assessment: Needs assistance Sitting-balance support: Feet supported;No upper extremity supported Sitting balance-Leahy Scale: Good     Standing balance support: Single extremity supported Standing balance-Leahy Scale: Fair Standing balance comment: able to perform static standing without UE support                             Pertinent Vitals/Pain Pain Assessment: No/denies pain    Home Living Family/patient expects to be discharged to:: Private residence Living Arrangements: Children Available Help at Discharge: Family;Available 24 hours/day Type of Home: House Home Access: Stairs to enter Entrance Stairs-Rails: Left Entrance Stairs-Number of Steps: 1 Home Layout: One level Home Equipment: Walker - 2 wheels;Cane - single point;Grab bars - tub/shower;Walker - 4 wheels;Wheelchair - manual Additional Comments: Reports she has been staying with lisa    Prior Function Level of Independence: Needs assistance   Gait / Transfers Assistance Needed: Pt reports she normally uses cane to walk  ADL's / Homemaking Assistance Needed: Per PT: aide comes daily to assist with dressing, bathing, and general mobility and anything else she may need.  PCA is 2x/wk for up to 3 hours.        Hand Dominance        Extremity/Trunk Assessment   Upper Extremity Assessment Upper Extremity Assessment: Defer to OT evaluation    Lower Extremity Assessment Lower Extremity Assessment: Generalized  weakness    Cervical / Trunk Assessment Cervical / Trunk Assessment: Kyphotic  Communication   Communication: No difficulties  Cognition Arousal/Alertness: Awake/alert Behavior During Therapy: WFL for tasks assessed/performed Overall Cognitive Status: History of cognitive impairments - at baseline                                 General Comments: Disoriented to situation and time. Reports she was in the hospital, but states she is in [place]. required step by step cues for mobility tasks.      General Comments General comments (skin integrity, edema, etc.): Pt reports she does not have any visual deficits. Did not formally assess, but was able to navigate room without running into objects    Exercises     Assessment/Plan    PT Assessment Patient needs continued PT services  PT Problem List Decreased strength;Decreased mobility;Decreased safety awareness;Decreased activity tolerance;Decreased cognition;Cardiopulmonary status limiting activity;Decreased balance;Decreased knowledge of use of DME;Decreased knowledge of precautions       PT Treatment Interventions DME instruction;Therapeutic activities;Gait training;Therapeutic exercise;Patient/family education;Stair training;Functional mobility training;Balance training;Cognitive remediation;Neuromuscular re-education    PT Goals (Current goals can be found in the Care Plan section)  Acute Rehab PT Goals Patient Stated Goal: to go home PT Goal Formulation: With patient Time For Goal Achievement: 12/18/20 Potential to Achieve Goals: Good    Frequency Min 4X/week   Barriers to discharge        Co-evaluation               AM-PAC PT "6 Clicks" Mobility  Outcome Measure Help needed turning from your back to your side while in a flat bed without using bedrails?: None Help needed moving from lying on your back to sitting on the side of a flat bed without using bedrails?: A Little Help needed moving to and  from a bed to a chair (including a wheelchair)?: A Little Help needed standing up from a chair using your arms (e.g., wheelchair or bedside chair)?: A Little Help needed to walk in hospital room?: A Little Help needed climbing 3-5 steps with a railing? : A Little 6 Click Score: 19    End of Session Equipment Utilized During Treatment: Gait belt Activity Tolerance: Patient tolerated treatment well Patient left: in bed;with call bell/phone within reach;with nursing/sitter in room (on stretcher in ED with safety sitter present) Nurse Communication: Mobility status PT Visit Diagnosis: Unsteadiness on feet (R26.81);Other abnormalities of gait and mobility (R26.89);Muscle weakness (generalized) (M62.81)    Time: 1344-1400 PT Time Calculation (min) (ACUTE ONLY): 16 min   Charges:   PT Evaluation $PT Eval Moderate Complexity: 1 Mod          Farley Ly, PT, DPT  Acute Rehabilitation Services  Pager: [phone] Office: [phone]   Lehman Prom 12/04/2020, 4:38 PM

## 2020-12-04 NOTE — Progress Notes (Addendum)
Initial Nutrition Assessment  DOCUMENTATION CODES:   Not applicable  INTERVENTION:   -Magic cup TID with meals, each supplement provides 290 kcal and 9 grams of protein  -MVI with minerals daily -Liberalize diet to regular  NUTRITION DIAGNOSIS:   Increased nutrient needs related to chronic illness (CHF, dementia) as evidenced by estimated needs.  GOAL:   Patient will meet greater than or equal to 90% of their needs  MONITOR:   PO intake, Supplement acceptance, Weight trends, Labs, Skin, I & O's  REASON FOR ASSESSMENT:   Consult Assessment of nutrition requirement/status  ASSESSMENT:   Bridget [name] is a 87 y.o. female with medical history significant of chronic diastolic CHF; COPD; stage 3 CKD; HTN; mild dementia; afib started on Eliquis 2 days ago presenting with new stroke symptoms.  She was hospitalized for CVA and discharged yesterday.  She had a great night.  This AM, she seemed to be ok, was waiting for her coffee.  They went out on the porch and she asked for something to eat.  Ate a little and then she appeared to fall asleep with mildly shallow breathing.  Her left hand started "vibrating so much, like a seizure or something."  She woke up and felt very hot and called for help.  The wheeled her back into the house and called 911.  It was just like last week, "had gone blind again."  She also had LUE weakness.  During the last stroke, she was found on her bed with complete vision loss.  She regained vision after receiving TPA; the next day her vision was mostly restored.  She had also progressed from mild to moderate dementia at the time of d/c.  No N/W/T at the time of d/c.  Today, she was unable to sense that the EMT was holding her hand.  Pt admitted with CVA.   Reviewed I/O's: -650 ml x 24 hours  Spoke with pt and daughters at bedside. Pt eating Chik Fil A at time of visit. Per daughters, pt had not eaten anything in the past 2 days due to being in the ED. She  typically has a good appetite, consuming 3 meals per day (Breakfast: eggs, toast, and juice; Lunch: soup or sandwich; Dinner: meat, starch, and vegetable). Pt drinks mostly tea and water. She does not like Ensure.   Per pt family, wt has been stable. Reviewed wt hx; no wt loss noted over the past 4 months.   Pt with mild to moderate muscle depletions, likely related to advanced age.   Discussed importance of good meal and supplement intake.   Per palliative care notes, pt does not desire a feeding tube.  Medications reviewed.   Labs reviewed.    NUTRITION - FOCUSED PHYSICAL EXAM:  Flowsheet Row Most Recent Value  Orbital Region Mild depletion  Upper Arm Region Mild depletion  Thoracic and Lumbar Region No depletion  Buccal Region No depletion  Temple Region Mild depletion  Clavicle Bone Region Moderate depletion  Clavicle and Acromion Bone Region Moderate depletion  Scapular Bone Region Moderate depletion  Dorsal Hand Mild depletion  Patellar Region No depletion  Anterior Thigh Region No depletion  Posterior Calf Region No depletion  Edema (RD Assessment) Mild  Hair Reviewed  Eyes Reviewed  Mouth Reviewed  Skin Reviewed  Nails Reviewed       Diet Order:   Diet Order             Diet Heart Room service appropriate? Yes;  Fluid consistency: Thin  Diet effective ____                   EDUCATION NEEDS:   Education needs have been addressed  Skin:  Skin Assessment: Reviewed RN Assessment  Last BM:  Unknown  Height:   Ht Readings from Last 1 Encounters:  11/26/20 5\' 4"  (1.626 m)    Weight:   Wt Readings from Last 1 Encounters:  11/30/20 63.8 kg    Ideal Body Weight:  54.5 kg  BMI:  There is no height or weight on file to calculate BMI.  Estimated Nutritional Needs:   Kcal:  1600-1800  Protein:  85-100 grams  Fluid:  > 1.6 L    12/02/20, RD, LDN, CDCES Registered Dietitian II Certified Diabetes Care and Education Specialist Please  refer to [facility] for RD and/or RD on-call/weekend/after hours pager

## 2020-12-04 NOTE — Progress Notes (Signed)
PROGRESS NOTE    Bridget [name]  WUJ:[id number] DOB: 04-04-1926 DOA: 12/03/2020 PCP: Glori Bickers, MD  Brief Narrative: She was admitted for neurologic deficits and discharged today from the hospital after CVA   Subjective: Admitted yesterday afternoon after having coffee at her house in the morning and went out on the porch and ate a little.  Was noted to have some shallow breathing and her left hand vibrating like a seizure or something.  Family brought her back into the house and they called 911.  They had concerns of another stroke.  She was also noted to have some left upper extremity weakness.  During previous hospitalization for stroke she had complete vision loss but after receiving tPA most of her vision was restored.   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, discharged from the neurology service yesterday -She returned today with new transient weakness, vision loss which appear to be improving -CTA with severe stenosis in proximal P3 -Not a candidate for tPA or endovascular therapy -Seen by neurology, but recommends repeat MRI to measure the new infarct but no further CVA work-up -Telemetry monitoring -MRI shows multiple acute/subacute strokes but does not appear that there are any new strokes when compared to MRI of last week per neurology -EEG negative for any subclinical seizures -Neurology consulted and following they believe there is no definite new stroke.  MRI shows continued evolution of known recent  strokes. -PT/OT/ST/Nutrition Consults -CIR consult for possible placement; if not a candidate, consider SNF rehab   HTN -Allow permissive HTN for now -Treat BP only if >220/120, and then with goal of 15% reduction -Hold Bisoprolol and plan to restart in 48-72 hours   HLD -Continue Lipitor 10 mg daily   Chronic diastolic CHF -Appears to be compensated -Hold Lasix for now   COPD  -prn Duoneb   Stage 3a CKD -Appears to be stable   Dementia -Appears to have waxing and waning of stroke symptoms, which may be more consistent with dementia -Reported to have progressed from mild to moderate, definitely appears to have moderate impairment currently -Continue Aricept -Family counseled about how patients with dementia often do better behaviorally while hospitalized if family members are present overnight    Afib -Continue Eliquis -Holding bisoprolol so will need to monitor HR   DVT prophylaxis: Eliquis Code Status: DNR Family Communication: No family present at bedside Disposition:   Status is: Inpatient  Remains inpatient appropriate because:Inpatient level of care appropriate due to severity of illness  Dispo: The patient is from: home              Anticipated d/c is to: SNF              Patient currently is not medically stable to d/c.   Difficult to place patient No        Consultants:  Neurology     Objective: Vitals:   12/04/20 1045 12/04/20 1145 12/04/20 1417 12/04/20 1546  BP: (!) 129/100 133/80 130/78 130/71  Pulse: 86 81 80 84  Resp: Temp:  98.1 F (36.7 C) (!) 97.5 F (36.4 C)  TempSrc:   Oral   SpO2: 97% 98% 98% 95%    Intake/Output Summary (Last 24 hours) at 12/04/2020 1901 Last data filed at 12/04/2020 1212 Gross per 24 hour  Intake --  Output 650 ml  Net -650 ml   There were no vitals filed for this visit.  Examination:  General: Appears well-developed; no acute distress. Psych: Affect appropriate to situation Eyes:  No scleral injection HENT: No OP obstrucion Head: Normocephalic. Cardiovascular: Irregular; rate normal Respiratory: Effort normal and breath sounds normal to anterior ascultation GI: Soft.  No distension. There is no tenderness. Skin: WDI      Data Reviewed: I have personally reviewed following labs and imaging studies  CBC: Recent Labs  Lab 12/03/20 1452 12/03/20 1457  WBC 7.7  --   NEUTROABS 4.7  --   HGB 15.7* 16.3*  HCT 48.0* 48.0*  MCV 96.0  --   PLT 155  --     Basic Metabolic Panel: Recent Labs  Lab 12/03/20 1452 12/03/20 1457  NA 136 136  K 3.7 3.5  CL 97* 97*  CO2 30  --   GLUCOSE 106* 103*  BUN 18 19  CREATININE 0.92 0.80  CALCIUM 8.8*  --     GFR: Estimated Creatinine Clearance: 37.9 mL/min (by C-G formula based on SCr of 0.8 mg/dL).  Liver Function Tests: Recent Labs  Lab 12/03/20 1452  AST 30  ALT 27  ALKPHOS 55  BILITOT 0.7  PROT 6.2*  ALBUMIN 3.1*    CBG: Recent Labs  Lab 11/27/20 1944 12/03/20 1450  GLUCAP 118* 105*     Recent Results (from the past 240 hour(s))  Resp Panel by RT-PCR (Flu A&B, Covid) Nasopharyngeal Swab     Status: None   Collection Time: 11/26/20  3:51 PM   Specimen: Nasopharyngeal Swab; Nasopharyngeal(NP) swabs in vial transport medium  Result Value Ref Range Status   SARS Coronavirus 2 by RT PCR NEGATIVE NEGATIVE Final    Comment: (NOTE) SARS-CoV-2 target nucleic acids are NOT DETECTED.  The SARS-CoV-2 RNA is generally detectable in upper respiratory specimens during the acute phase of infection. The lowest concentration of SARS-CoV-2 viral copies this assay can detect is 138 copies/mL. A negative result does not preclude SARS-Cov-2 infection and should not be used as the sole basis for treatment or other patient management decisions. A negative result may occur with  improper specimen collection/handling, submission of specimen other than nasopharyngeal swab, presence of viral mutation(s) within  the areas targeted by this assay, and inadequate number of viral copies(<138 copies/mL). A negative result must be combined with clinical observations, patient history, and epidemiological information. The expected result is Negative.  Fact Sheet for Patients:  BloggerCourse.com  Fact Sheet for Healthcare Providers:  SeriousBroker.it  This test is no t yet approved or cleared by the [place] FDA and  has been authorized for detection and/or diagnosis of SARS-CoV-2 by FDA under an Emergency Use Authorization (EUA). This EUA will remain  in effect (meaning this test can be used) for the duration of the COVID-19 declaration under Section 564(b)(1) of the Act, 21 U.S.C.section 360bbb-3(b)(1), unless the authorization is terminated  or revoked sooner.       Influenza A by PCR NEGATIVE NEGATIVE Final   Influenza B by PCR NEGATIVE NEGATIVE Final    Comment: (NOTE) The Xpert Xpress SARS-CoV-2/FLU/RSV plus assay is intended as an aid in the diagnosis of influenza from Nasopharyngeal  swab specimens and should not be used as a sole basis for treatment. Nasal washings and aspirates are unacceptable for Xpert Xpress SARS-CoV-2/FLU/RSV testing.  Fact Sheet for Patients: BloggerCourse.com  Fact Sheet for Healthcare Providers: SeriousBroker.it  This test is not yet approved or cleared by the [place] FDA and has been authorized for detection and/or diagnosis of SARS-CoV-2 by FDA under an Emergency Use Authorization (EUA). This EUA will remain in effect (meaning this test can be used) for the duration of the COVID-19 declaration under Section 564(b)(1) of the Act, 21 U.S.C. section 360bbb-3(b)(1), unless the authorization is terminated or revoked.  Performed at [facility], [address] [place], [place] [address]   MRSA PCR Screening     Status: None   Collection  Time: 11/26/20  5:09 PM  Result Value Ref Range Status   MRSA by PCR NEGATIVE NEGATIVE Final    Comment:        The GeneXpert MRSA Assay (FDA approved for NASAL specimens only), is one component of a comprehensive MRSA colonization surveillance program. It is not intended to diagnose MRSA infection nor to guide or monitor treatment for MRSA infections. Performed at [facility], [address], [place], [place] [address]   Resp Panel by RT-PCR (Flu A&B, Covid) Nasopharyngeal Swab     Status: None   Collection Time: 12/03/20  7:16 PM   Specimen: Nasopharyngeal Swab; Nasopharyngeal(NP) swabs in vial transport medium  Result Value Ref Range Status   SARS Coronavirus 2 by RT PCR NEGATIVE NEGATIVE Final    Comment: (NOTE) SARS-CoV-2 target nucleic acids are NOT DETECTED.  The SARS-CoV-2 RNA is generally detectable in upper respiratory specimens during the acute phase of infection. The lowest concentration of SARS-CoV-2 viral copies this assay can detect is 138 copies/mL. A negative result does not preclude SARS-Cov-2 infection and should not be used as the sole basis for treatment or other patient management decisions. A negative result may occur with  improper specimen collection/handling, submission of specimen other than nasopharyngeal swab, presence of viral mutation(s) within the areas targeted by this assay, and inadequate number of viral copies(<138 copies/mL). A negative result must be combined with clinical observations, patient history, and epidemiological information. The expected result is Negative.  Fact Sheet for Patients:  BloggerCourse.com  Fact Sheet for Healthcare Providers:  SeriousBroker.it  This test is no t yet approved or cleared by the [place] FDA and  has been authorized for detection and/or diagnosis of SARS-CoV-2 by FDA under an Emergency Use Authorization (EUA). This EUA will remain  in  effect (meaning this test can be used) for the duration of the COVID-19 declaration under Section 564(b)(1) of the Act, 21 U.S.C.section 360bbb-3(b)(1), unless the authorization is terminated  or revoked sooner.       Influenza A by PCR NEGATIVE NEGATIVE Final   Influenza B by PCR NEGATIVE NEGATIVE Final    Comment: (NOTE) The Xpert Xpress SARS-CoV-2/FLU/RSV plus assay is intended as an aid in the diagnosis of influenza from Nasopharyngeal swab specimens and should not be used as a sole basis for treatment. Nasal washings and aspirates are unacceptable for Xpert Xpress SARS-CoV-2/FLU/RSV testing.  Fact Sheet for Patients: BloggerCourse.com  Fact Sheet for Healthcare Providers: SeriousBroker.it  This test is not yet approved or cleared by the [place] FDA and has been authorized for detection and/or diagnosis of SARS-CoV-2 by FDA under an Emergency Use Authorization (EUA). This EUA will remain in effect (meaning this test can be  used) for the duration of the COVID-19 declaration under Section 564(b)(1) of the Act, 21 U.S.C. section 360bbb-3(b)(1), unless the authorization is terminated or revoked.  Performed at [facility], [address] [place], [place] [address]          Radiology Studies: MR BRAIN WO CONTRAST  Result Date: 12/03/2020 CLINICAL DATA:  Initial evaluation for stroke suspected. EXAM: MRI HEAD WITHOUT CONTRAST TECHNIQUE: Multiplanar, multiecho pulse sequences of the brain and surrounding structures were obtained without intravenous contrast. COMPARISON:  Prior CTs from earlier the same day as well as recent MRI from 11/27/2020. FINDINGS: Brain: Examination mildly degraded by motion artifact. Diffuse prominence of the CSF containing spaces compatible generalized age-related cerebral atrophy. Patchy and confluent T2/FLAIR hyperintensity within the periventricular deep white matter both cerebral  hemispheres as well as the pons, most consistent with chronic small vessel ischemic disease, moderately advanced in nature. There has been continued interval evolution of the subacute left PCA distribution infarct with associated hemorrhagic transformation. Appearance is within normal limits for normal expected interval evolution. Associated hemorrhage now demonstrates intrinsic T1 hyperintensity and is slightly smaller as compared to previous MRI, now measuring approximately 3.5 x 2.0 cm (series 8, image 26). No significant regional mass effect. Continued interval evolution of subacute right PCA distribution infarct. Area of involvement has slightly expanded as compared to previous MRI, with further lateral extension into peripheral right temporal occipital region as compared to previous (series 2, image 21). Overall appearance is subacute however, with no new superimposed areas of acute infarction. Associated mild petechial hemorrhage without frank hemorrhagic transformation or significant mass effect. Additional subacute cerebellar infarcts are overall similar in size from previous MRI, with no new areas of infarction. No associated hemorrhage or mass effect. Additional punctate focus of subacute ischemia at the anterior left frontal lobe noted as well, also stable from prior (series 2, image 37). There may be 1 new punctate 4 mm focus of diffusion abnormality involving the ventral medial left thalamus, not entirely certain as this is only seen on axial DWI sequence (series 2, image 32), and not seen on corresponding coronal sequence or ADC map. No other new areas of acute ischemia. Gray-white matter differentiation otherwise maintained. No other evidence for acute intracranial hemorrhage. Additional subcentimeter chronic microhemorrhage noted at the anterior left frontal corona radiata, likely small vessel related. No mass lesion, midline shift or mass effect. No hydrocephalus or extra-axial fluid collection.  Pituitary gland suprasellar region within normal limits. Midline structures intact. Vascular: Major intracranial vascular flow voids are maintained at the skull base. Skull and upper cervical spine: Craniocervical junction within normal limits. Bone marrow signal intensity normal. No scalp soft tissue abnormality. Sinuses/Orbits: Patient status post ocular lens replacement on the left. Globes and orbital soft tissues demonstrate no acute finding. Scattered mucosal thickening noted throughout the sphenoid ethmoidal and maxillary sinuses. Superimposed small air-fluid levels noted within the maxillary sinuses. Right mastoid effusion noted. Visualized nasopharynx unremarkable. Inner ear structures grossly normal. Other: None. IMPRESSION: 1. Continued interval evolution of the subacute right PCA distribution infarct, slightly expanded in territory as compared to previous MRI, but overall subacute in appearance with no new areas of superimposed infarction. Associated mild petechial hemorrhage without hemorrhagic transformation. 2. Continued interval evolution of subacute left PCA distribution infarct with associated hemorrhagic transformation, overall decreased in size from previous. 3. Additional evolving subacute ischemic infarcts involving the left frontal white matter and cerebellum, overall stable in size and distribution. 4. Possible one new 4 mm acute  ischemic infarct involving the ventromedial left thalamus as above, not entirely certain and could be artifactual in nature. 5. Underlying age-related cerebral atrophy with moderately advanced chronic microvascular ischemic disease 6. Acute ethmoidal and maxillary sinusitis. 7. Right mastoid effusion, similar to previous. Electronically Signed   By: Rise Mu M.D.   On: 12/03/2020 20:45   EEG adult  Result Date: 12/04/2020 Charlsie Quest, MD     12/04/2020  1:43 PM Patient Name: VIRGINIA CURL MRN: [id number] Epilepsy Attending: Charlsie Quest  Referring Physician/Provider: Dr. Jonah Blue Date: 12/04/2020 Duration: 23.41 minutes Patient history: 87 year old female with recent stroke now with worsening weakness and vision loss.  EEG to evaluate for seizures. Level of alertness: Awake AEDs during EEG study: None Technical aspects: This EEG study was done with scalp electrodes positioned according to the 10-20 International system of electrode placement. Electrical activity was acquired at a sampling rate of  and reviewed with a high frequency filter of  and a low frequency filter of . EEG data were recorded continuously and digitally stored. Description: The posterior dominant rhythm consists of 8 Hz activity of moderate voltage (25-35 uV) seen predominantly in posterior head regions, symmetric and reactive to eye opening and eye closing.  Intermittent generalized 3 to 5 Hz theta-delta slowing was also noted. Hyperventilation and photic stimulation were not performed.   ABNORMALITY - Intermittent slow, generalized IMPRESSION: This study is suggestive of mild diffuse encephalopathy, nonspecific etiology. No seizures or epileptiform discharges were seen throughout the recording. Charlsie Quest   CT HEAD CODE STROKE WO CONTRAST  Result Date: 12/03/2020 CLINICAL DATA:  Code stroke.  Acute neuro deficit.  Recent stroke. EXAM: CT HEAD WITHOUT CONTRAST TECHNIQUE: Contiguous axial images were obtained from the base of the skull through the vertex without intravenous contrast. COMPARISON:  CT head 11/28/2020, MRI head 11/27/2020 FINDINGS: Brain: Progressive cytotoxic edema in the right occipital lobe compatible with recent infarction. This was present previously but appears larger in territory which may be due to progressive edema or extension of infarction. No associated hemorrhage Cytotoxic edema infarct in the left occipital lobe with hemorrhagic transformation similar to the prior study. No new hemorrhage Recent infarct in the right lateral  cerebellum best seen on MRI. No definite change. Generalized atrophy. Chronic microvascular ischemic changes throughout the white matter as noted previously. Vascular: Negative for hyperdense vessel Skull: No acute abnormality Sinuses/Orbits: Multiple air-fluid levels in the sinuses. No acute orbital abnormality. Left cataract extraction. Other: None IMPRESSION: 1. Right PCA infarct shows progressive edema. This may be due to evolving infarct versus extension of infarct. No associated hemorrhage in the right occipital lobe. 2. Infarct in the left occipital lobe with hemorrhagic transformation unchanged from the recent CT. 3. Recent infarct in the right cerebellum appears stable. Electronically Signed   By: Marlan Palau M.D.   On: 12/03/2020 15:11   CT ANGIO HEAD CODE STROKE  Addendum Date: 12/03/2020   ADDENDUM REPORT: 12/03/2020 20:44 ADDENDUM: CTA head impressions #2 and #3 discussed with Dr. Pearlean Brownie via telephone at 4 p.m. on 12/03/2020. Electronically Signed   By: Jackey Loge DO   On: 12/03/2020 20:44   Result Date: 12/03/2020 CLINICAL DATA:  Neuro deficit, acute, stroke suspected. Stroke/TIA, assess extracranial arteries. EXAM: CT ANGIOGRAPHY HEAD AND NECK TECHNIQUE: Multidetector CT imaging of the head and neck was performed using the standard protocol during bolus administration of intravenous contrast. Multiplanar CT image reconstructions and MIPs were obtained to evaluate the vascular anatomy. Carotid stenosis  measurements (when applicable) are obtained utilizing NASCET criteria, using the distal internal carotid diameter as the denominator. CONTRAST:  75mL OMNIPAQUE IOHEXOL 350 MG/ML SOLN COMPARISON:  Noncontrast head CT examinations 12/03/2020 and earlier. CT angiogram head/neck 11/26/2020. FINDINGS: CTA NECK FINDINGS Aortic arch: Common origin of the innominate and left common carotid arteries. Atherosclerotic plaque within the visualized aortic arch and proximal major branch vessels of the neck.  No hemodynamically significant innominate or proximal subclavian artery stenosis. Right carotid system: CCA and ICA patent within the neck. As before, there is soft and calcified plaque within the carotid bifurcation proximal ICA with less than 50% stenosis of the proximal ICA. Left carotid system: CCA and ICA patent within the neck. As before, there is scattered soft calcified plaque within the mid to distal CCA and within the carotid bifurcation. No measurable stenosis of the proximal left ICA. Vertebral arteries: Codominant and patent within the neck. Nonstenotic atherosclerotic plaque at both vertebral artery origins. Skeleton: No acute bony abnormality or aggressive osseous lesion. Cervical spondylosis Other neck: No neck mass or cervical lymphadenopathy. Upper chest: No consolidation within the imaged lung apices. Biapical pleuroparenchymal scarring. Calcified granuloma within the periphery of the right upper lobe. Review of the MIP images confirms the above findings CTA HEAD FINDINGS Evaluation of the intracranial arterial circulation is limited due to poor arterial contrast-enhancement. Anterior circulation: The intracranial internal carotid arteries are patent. Calcified plaque within both vessels with up to moderate stenosis within the distal cavernous/paraclinoid segments bilaterally. The M1 middle cerebral arteries are patent. Within described limitations, no definite proximal M2 branch occlusion or high-grade proximal M2 stenosis is identified. The anterior cerebral arteries are patent. Posterior circulation: The intracranial vertebral arteries are patent. Unchanged calcified plaque within the V4 right vertebral artery with resultant up to moderate stenosis. The basilar artery is patent. New from the prior examination, there is occlusion of a right PCA branch at the P2 segment level (series 13, image 24). (Series 11, image 20). The left posterior cerebral artery is patent. Severe stenosis within the  proximal P3 left PCA, not definitively present on the prior CTA of 11/26/2020. Venous sinuses: Within the limitations of contrast timing, no convincing thrombus. Anatomic variants: Posterior communicating arteries are hypoplastic or absent bilaterally. Review of the MIP images confirms the above findings IMPRESSION: CTA neck: The common carotid, internal carotid and vertebral arteries are patent within the neck without hemodynamically significant stenosis (50% or greater). Arterial atherosclerotic disease within the neck, as described. CTA head: 1. Evaluation is limited due to poor intracranial arterial contrast enhancement. 2. However, a new right PCA branch occlusion is identified (at the P2 segment level). 3. Additionally, there is a severe stenosis within the proximal P3 segment of the left posterior cerebral artery, which was not definitively present on the prior CTA of 11/26/2020. 4. Additional intracranial atherosclerotic disease, as described, with sites of up to moderate stenosis in the bilateral intracranial ICAs and V4 right vertebral artery. Electronically Signed: By: Jackey Loge DO On: 12/03/2020 15:56   CT ANGIO NECK CODE STROKE  Addendum Date: 12/03/2020   ADDENDUM REPORT: 12/03/2020 20:44 ADDENDUM: CTA head impressions #2 and #3 discussed with Dr. Pearlean Brownie via telephone at 4 p.m. on 12/03/2020. Electronically Signed   By: Jackey Loge DO   On: 12/03/2020 20:44   Result Date: 12/03/2020 CLINICAL DATA:  Neuro deficit, acute, stroke suspected. Stroke/TIA, assess extracranial arteries. EXAM: CT ANGIOGRAPHY HEAD AND NECK TECHNIQUE: Multidetector CT imaging of the head and neck was performed using  the standard protocol during bolus administration of intravenous contrast. Multiplanar CT image reconstructions and MIPs were obtained to evaluate the vascular anatomy. Carotid stenosis measurements (when applicable) are obtained utilizing NASCET criteria, using the distal internal carotid diameter as the  denominator. CONTRAST:  95mL OMNIPAQUE IOHEXOL 350 MG/ML SOLN COMPARISON:  Noncontrast head CT examinations 12/03/2020 and earlier. CT angiogram head/neck 11/26/2020. FINDINGS: CTA NECK FINDINGS Aortic arch: Common origin of the innominate and left common carotid arteries. Atherosclerotic plaque within the visualized aortic arch and proximal major branch vessels of the neck. No hemodynamically significant innominate or proximal subclavian artery stenosis. Right carotid system: CCA and ICA patent within the neck. As before, there is soft and calcified plaque within the carotid bifurcation proximal ICA with less than 50% stenosis of the proximal ICA. Left carotid system: CCA and ICA patent within the neck. As before, there is scattered soft calcified plaque within the mid to distal CCA and within the carotid bifurcation. No measurable stenosis of the proximal left ICA. Vertebral arteries: Codominant and patent within the neck. Nonstenotic atherosclerotic plaque at both vertebral artery origins. Skeleton: No acute bony abnormality or aggressive osseous lesion. Cervical spondylosis Other neck: No neck mass or cervical lymphadenopathy. Upper chest: No consolidation within the imaged lung apices. Biapical pleuroparenchymal scarring. Calcified granuloma within the periphery of the right upper lobe. Review of the MIP images confirms the above findings CTA HEAD FINDINGS Evaluation of the intracranial arterial circulation is limited due to poor arterial contrast-enhancement. Anterior circulation: The intracranial internal carotid arteries are patent. Calcified plaque within both vessels with up to moderate stenosis within the distal cavernous/paraclinoid segments bilaterally. The M1 middle cerebral arteries are patent. Within described limitations, no definite proximal M2 branch occlusion or high-grade proximal M2 stenosis is identified. The anterior cerebral arteries are patent. Posterior circulation: The intracranial  vertebral arteries are patent. Unchanged calcified plaque within the V4 right vertebral artery with resultant up to moderate stenosis. The basilar artery is patent. New from the prior examination, there is occlusion of a right PCA branch at the P2 segment level (series 13, image 24). (Series 11, image 20). The left posterior cerebral artery is patent. Severe stenosis within the proximal P3 left PCA, not definitively present on the prior CTA of 11/26/2020. Venous sinuses: Within the limitations of contrast timing, no convincing thrombus. Anatomic variants: Posterior communicating arteries are hypoplastic or absent bilaterally. Review of the MIP images confirms the above findings IMPRESSION: CTA neck: The common carotid, internal carotid and vertebral arteries are patent within the neck without hemodynamically significant stenosis (50% or greater). Arterial atherosclerotic disease within the neck, as described. CTA head: 1. Evaluation is limited due to poor intracranial arterial contrast enhancement. 2. However, a new right PCA branch occlusion is identified (at the P2 segment level). 3. Additionally, there is a severe stenosis within the proximal P3 segment of the left posterior cerebral artery, which was not definitively present on the prior CTA of 11/26/2020. 4. Additional intracranial atherosclerotic disease, as described, with sites of up to moderate stenosis in the bilateral intracranial ICAs and V4 right vertebral artery. Electronically Signed: By: Jackey Loge DO On: 12/03/2020 15:56        Scheduled Meds:  apixaban  2.5 mg Oral BID   atorvastatin  20 mg Oral QHS   multivitamin with minerals  1 tablet Oral Daily   Continuous Infusions:  sodium chloride 50 mL/hr at 12/04/20 1741     LOS: 1 day    Time spent: 35-minute  Verdia KubaShayan S Sandeep Radell, MD [facility]   To contact the attending provider between 7A-7P or the covering provider during after hours 7P-7A, please log into the web site  [email or web address] and access using universal [place] password for that web site. If you do not have the password, please call the hospital operator.  12/04/2020, 7:01 PM

## 2020-12-04 NOTE — Progress Notes (Signed)
EEG Completed; Results Pending  

## 2020-12-04 NOTE — ED Notes (Signed)
Nurse report given to floor nurse. Patient is with sitter/ CNA

## 2020-12-05 MED ORDER — BISOPROLOL FUMARATE 5 MG PO TABS
5.0000 mg | ORAL_TABLET | Freq: Every day | ORAL | Status: DC
  Administered 2020-12-05: 5 mg via ORAL
  Filled 2020-12-05 (×2): qty 1

## 2020-12-05 NOTE — Discharge Instructions (Addendum)
1) Home health with PT/OT will be arranged for you 2) Take medications as prescribed 3) Follow up with neurology, cardiology 4) Referred to Dr. Larwance Sachs for PCP care. If he is not taking patients then you will need to call your insurance and ask them for a PCP in [place]  Information on my medicine - ELIQUIS (apixaban)    This medication education was reviewed with me or my healthcare representative as part of my discharge preparation.     Why was Eliquis prescribed for you? Eliquis was prescribed for you to reduce the risk of a blood clot forming that can cause a stroke if you have a medical condition called atrial fibrillation (a type of irregular heartbeat).  What do You need to know about Eliquis ? Take your Eliquis TWICE DAILY - one tablet in the morning and one tablet in the evening with or without food. If you have difficulty swallowing the tablet whole please discuss with your pharmacist how to take the medication safely.  Take Eliquis exactly as prescribed by your doctor and DO NOT stop taking Eliquis without talking to the doctor who prescribed the medication.  Stopping may increase your risk of developing a stroke.  Refill your prescription before you run out.  After discharge, you should have regular check-up appointments with your healthcare provider that is prescribing your Eliquis.  In the future your dose may need to be changed if your kidney function or weight changes by a significant amount or as you get older.  What do you do if you miss a dose? If you miss a dose, take it as soon as you remember on the same day and resume taking twice daily.  Do not take more than one dose of ELIQUIS at the same time to make up a missed dose.  Important Safety Information A possible side effect of Eliquis is bleeding. You should call your healthcare provider right away if you experience any of the following: Bleeding from an injury or your nose that does not stop. Unusual  colored urine (red or dark brown) or unusual colored stools (red or black). Unusual bruising for unknown reasons. A serious fall or if you hit your head (even if there is no bleeding).  Some medicines may interact with Eliquis and might increase your risk of bleeding or clotting while on Eliquis. To help avoid this, consult your healthcare provider or pharmacist prior to using any new prescription or non-prescription medications, including herbals, vitamins, non-steroidal anti-inflammatory drugs (NSAIDs) and supplements.  This website has more information on Eliquis (apixaban): http://www.eliquis.com/eliquis/home

## 2020-12-05 NOTE — Progress Notes (Signed)
Inpatient Rehab Admissions Coordinator:    CIR consult received. PT and OT are recommending home with [facility] PT/OT. After discussion with family and attending MD,  I am in agreement that this is the best plan for Pt. CIR will sign off.   Megan Salon, MS, CCC-SLP Rehab Admissions Coordinator  [phone] (celll) [phone] (office)

## 2020-12-05 NOTE — Progress Notes (Signed)
PROGRESS NOTE    Bridget [name]  ZOX:[id number] DOB: 05-14-1926 DOA: 12/03/2020 PCP: Glori Bickersrivedi, Rajendra, MD  Brief Narrative: She was admitted for neurologic deficits and discharged from the hospital recently  after CVA   Subjective: No acute complaints.  No acute events reported by nursing staff overnight.  Patient seen and examined at bedside this morning.  She is sitting up in bed and eating her breakfast.   Assessment & Plan:   Principal Problem:   Acute CVA (cerebrovascular accident) (HCC) Active Problems:   Essential hypertension   Atrial fibrillation, chronic (HCC)   CKD (chronic kidney disease), stage III (HCC)   Chronic diastolic CHF (congestive heart failure) (HCC)   Dementia with behavioral disturbance (HCC)   Goals of care, counseling/discussion   DNR (do not resuscitate)  CVA With recent bilateral posterior cerebral subacute infarcts who presented with sudden onset of cortical blindness and transient left-sided weakness suspicious for new right PCA infarct however MRI scan does not show significant changes compared to previous MRI from a week ago.  Concern for possible unwitnessed seizure-like activity with postictal state however EEG shows mild diffuse encephalopathy and no definite seizures.  Neurology following in consultation and recommended continuing Eliquis for stroke prevention.  No further repeat stroke work-up needed.  She is doing well PT/OT.  They recommend home health PT/OT upon discharge.  She will need a bedside commode.  I did discuss this with the nephew and he stated if the patient is agreeable he is okay with discharging her with home health services.   HTN -Resume patient's home bisoprolol   HLD -Continue Lipitor 10 mg daily   Chronic diastolic CHF -Appears to be compensated -Hold Lasix for now   COPD  -prn Duoneb   Stage 3a CKD -Appears to be stable   Dementia -Appears to have waxing and waning of stroke symptoms, which may be more  consistent with dementia -Reported to have progressed from mild to moderate, definitely appears to have moderate impairment currently -Continue Aricept -Family counseled about how patients with dementia often do better behaviorally while hospitalized if family members are present overnight    Afib -Continue Eliquis -Resuming home bisoprolol   DVT prophylaxis: Eliquis Code Status: DNR Family Communication: No family present at bedside Disposition:   Status is: Inpatient  Remains inpatient appropriate because:Inpatient level of care appropriate due to severity of illness  Dispo: The patient is from: home              Anticipated d/c is to: Home health services              Patient currently is not medically stable to d/c.   Difficult to place patient No        Consultants:  Neurology     Objective: Vitals:   12/05/20 0024 12/05/20 0412 12/05/20 0807 12/05/20 1703  BP: (!) 147/79 (!) 131/99 120/75 134/71  Pulse: 72 80 72 77  Resp: 18 18 17 17   Temp: 97.7 F (36.5 C) 98.4 F (36.9 C) 97.9 F (36.6 C) 97.9 F (36.6 C)  TempSrc: Oral Oral Axillary Oral  SpO2: 96% 100% 98% 98%    Intake/Output Summary (Last 24 hours) at 12/05/2020 1819 Last data filed at 12/05/2020 1300 Gross per 24 hour  Intake 2142.87 ml  Output 2 ml  Net 2140.87 ml    There were no vitals filed for this visit.  Examination:  General: Appears well-developed; no acute distress. Psych: Affect appropriate to situation Eyes: No scleral  injection HENT: No OP obstrucion Head: Normocephalic. Cardiovascular: Irregular; rate normal Respiratory: Effort normal and breath sounds normal to anterior ascultation GI: Soft.  No distension. There is no tenderness. Skin: WDI      Data Reviewed: I have personally reviewed following labs and imaging studies  CBC: Recent Labs  Lab 12/03/20 1452 12/03/20 1457  WBC 7.7  --   NEUTROABS 4.7  --   HGB 15.7* 16.3*  HCT 48.0* 48.0*  MCV 96.0  --    PLT 155  --      Basic Metabolic Panel: Recent Labs  Lab 12/03/20 1452 12/03/20 1457  NA 136 136  K 3.7 3.5  CL 97* 97*  CO2 30  --   GLUCOSE 106* 103*  BUN 18 19  CREATININE 0.92 0.80  CALCIUM 8.8*  --      GFR: Estimated Creatinine Clearance: 37.9 mL/min (by C-G formula based on SCr of 0.8 mg/dL).  Liver Function Tests: Recent Labs  Lab 12/03/20 1452  AST 30  ALT 27  ALKPHOS 55  BILITOT 0.7  PROT 6.2*  ALBUMIN 3.1*     CBG: Recent Labs  Lab 12/03/20 1450  GLUCAP 105*      Recent Results (from the past 240 hour(s))  Resp Panel by RT-PCR (Flu A&B, Covid) Nasopharyngeal Swab     Status: None   Collection Time: 11/26/20  3:51 PM   Specimen: Nasopharyngeal Swab; Nasopharyngeal(NP) swabs in vial transport medium  Result Value Ref Range Status   SARS Coronavirus 2 by RT PCR NEGATIVE NEGATIVE Final    Comment: (NOTE) SARS-CoV-2 target nucleic acids are NOT DETECTED.  The SARS-CoV-2 RNA is generally detectable in upper respiratory specimens during the acute phase of infection. The lowest concentration of SARS-CoV-2 viral copies this assay can detect is 138 copies/mL. A negative result does not preclude SARS-Cov-2 infection and should not be used as the sole basis for treatment or other patient management decisions. A negative result may occur with  improper specimen collection/handling, submission of specimen other than nasopharyngeal swab, presence of viral mutation(s) within the areas targeted by this assay, and inadequate number of viral copies(<138 copies/mL). A negative result must be combined with clinical observations, patient history, and epidemiological information. The expected result is Negative.  Fact Sheet for Patients:  BloggerCourse.com  Fact Sheet for Healthcare Providers:  SeriousBroker.it  This test is no t yet approved or cleared by the [place] FDA and  has been authorized  for detection and/or diagnosis of SARS-CoV-2 by FDA under an Emergency Use Authorization (EUA). This EUA will remain  in effect (meaning this test can be used) for the duration of the COVID-19 declaration under Section 564(b)(1) of the Act, 21 U.S.C.section 360bbb-3(b)(1), unless the authorization is terminated  or revoked sooner.       Influenza A by PCR NEGATIVE NEGATIVE Final   Influenza B by PCR NEGATIVE NEGATIVE Final    Comment: (NOTE) The Xpert Xpress SARS-CoV-2/FLU/RSV plus assay is intended as an aid in the diagnosis of influenza from Nasopharyngeal swab specimens and should not be used as a sole basis for treatment. Nasal washings and aspirates are unacceptable for Xpert Xpress SARS-CoV-2/FLU/RSV testing.  Fact Sheet for Patients: BloggerCourse.com  Fact Sheet for Healthcare Providers: SeriousBroker.it  This test is not yet approved or cleared by the [place] FDA and has been authorized for detection and/or diagnosis of SARS-CoV-2 by FDA under an Emergency Use Authorization (EUA). This EUA will remain in effect (meaning this test can  be used) for the duration of the COVID-19 declaration under Section 564(b)(1) of the Act, 21 U.S.C. section 360bbb-3(b)(1), unless the authorization is terminated or revoked.  Performed at [facility], [address] [place], [place] [address]   MRSA PCR Screening     Status: None   Collection Time: 11/26/20  5:09 PM  Result Value Ref Range Status   MRSA by PCR NEGATIVE NEGATIVE Final    Comment:        The GeneXpert MRSA Assay (FDA approved for NASAL specimens only), is one component of a comprehensive MRSA colonization surveillance program. It is not intended to diagnose MRSA infection nor to guide or monitor treatment for MRSA infections. Performed at [facility], [address] [place], [place] [address]   Resp Panel by RT-PCR (Flu A&B, Covid)  Nasopharyngeal Swab     Status: None   Collection Time: 12/03/20  7:16 PM   Specimen: Nasopharyngeal Swab; Nasopharyngeal(NP) swabs in vial transport medium  Result Value Ref Range Status   SARS Coronavirus 2 by RT PCR NEGATIVE NEGATIVE Final    Comment: (NOTE) SARS-CoV-2 target nucleic acids are NOT DETECTED.  The SARS-CoV-2 RNA is generally detectable in upper respiratory specimens during the acute phase of infection. The lowest concentration of SARS-CoV-2 viral copies this assay can detect is 138 copies/mL. A negative result does not preclude SARS-Cov-2 infection and should not be used as the sole basis for treatment or other patient management decisions. A negative result may occur with  improper specimen collection/handling, submission of specimen other than nasopharyngeal swab, presence of viral mutation(s) within the areas targeted by this assay, and inadequate number of viral copies(<138 copies/mL). A negative result must be combined with clinical observations, patient history, and epidemiological information. The expected result is Negative.  Fact Sheet for Patients:  BloggerCourse.com  Fact Sheet for Healthcare Providers:  SeriousBroker.it  This test is no t yet approved or cleared by the [place] FDA and  has been authorized for detection and/or diagnosis of SARS-CoV-2 by FDA under an Emergency Use Authorization (EUA). This EUA will remain  in effect (meaning this test can be used) for the duration of the COVID-19 declaration under Section 564(b)(1) of the Act, 21 U.S.C.section 360bbb-3(b)(1), unless the authorization is terminated  or revoked sooner.       Influenza A by PCR NEGATIVE NEGATIVE Final   Influenza B by PCR NEGATIVE NEGATIVE Final    Comment: (NOTE) The Xpert Xpress SARS-CoV-2/FLU/RSV plus assay is intended as an aid in the diagnosis of influenza from Nasopharyngeal swab specimens and should not be  used as a sole basis for treatment. Nasal washings and aspirates are unacceptable for Xpert Xpress SARS-CoV-2/FLU/RSV testing.  Fact Sheet for Patients: BloggerCourse.com  Fact Sheet for Healthcare Providers: SeriousBroker.it  This test is not yet approved or cleared by the [place] FDA and has been authorized for detection and/or diagnosis of SARS-CoV-2 by FDA under an Emergency Use Authorization (EUA). This EUA will remain in effect (meaning this test can be used) for the duration of the COVID-19 declaration under Section 564(b)(1) of the Act, 21 U.S.C. section 360bbb-3(b)(1), unless the authorization is terminated or revoked.  Performed at [facility], [address] [place], [place] [address]           Radiology Studies: MR BRAIN WO CONTRAST  Result Date: 12/03/2020 CLINICAL DATA:  Initial evaluation for stroke suspected. EXAM: MRI HEAD WITHOUT CONTRAST TECHNIQUE: Multiplanar, multiecho pulse sequences of the  brain and surrounding structures were obtained without intravenous contrast. COMPARISON:  Prior CTs from earlier the same day as well as recent MRI from 11/27/2020. FINDINGS: Brain: Examination mildly degraded by motion artifact. Diffuse prominence of the CSF containing spaces compatible generalized age-related cerebral atrophy. Patchy and confluent T2/FLAIR hyperintensity within the periventricular deep white matter both cerebral hemispheres as well as the pons, most consistent with chronic small vessel ischemic disease, moderately advanced in nature. There has been continued interval evolution of the subacute left PCA distribution infarct with associated hemorrhagic transformation. Appearance is within normal limits for normal expected interval evolution. Associated hemorrhage now demonstrates intrinsic T1 hyperintensity and is slightly smaller as compared to previous MRI, now measuring approximately 3.5 x 2.0 cm  (series 8, image 26). No significant regional mass effect. Continued interval evolution of subacute right PCA distribution infarct. Area of involvement has slightly expanded as compared to previous MRI, with further lateral extension into peripheral right temporal occipital region as compared to previous (series 2, image 21). Overall appearance is subacute however, with no new superimposed areas of acute infarction. Associated mild petechial hemorrhage without frank hemorrhagic transformation or significant mass effect. Additional subacute cerebellar infarcts are overall similar in size from previous MRI, with no new areas of infarction. No associated hemorrhage or mass effect. Additional punctate focus of subacute ischemia at the anterior left frontal lobe noted as well, also stable from prior (series 2, image 37). There may be 1 new punctate 4 mm focus of diffusion abnormality involving the ventral medial left thalamus, not entirely certain as this is only seen on axial DWI sequence (series 2, image 32), and not seen on corresponding coronal sequence or ADC map. No other new areas of acute ischemia. Gray-white matter differentiation otherwise maintained. No other evidence for acute intracranial hemorrhage. Additional subcentimeter chronic microhemorrhage noted at the anterior left frontal corona radiata, likely small vessel related. No mass lesion, midline shift or mass effect. No hydrocephalus or extra-axial fluid collection. Pituitary gland suprasellar region within normal limits. Midline structures intact. Vascular: Major intracranial vascular flow voids are maintained at the skull base. Skull and upper cervical spine: Craniocervical junction within normal limits. Bone marrow signal intensity normal. No scalp soft tissue abnormality. Sinuses/Orbits: Patient status post ocular lens replacement on the left. Globes and orbital soft tissues demonstrate no acute finding. Scattered mucosal thickening noted throughout  the sphenoid ethmoidal and maxillary sinuses. Superimposed small air-fluid levels noted within the maxillary sinuses. Right mastoid effusion noted. Visualized nasopharynx unremarkable. Inner ear structures grossly normal. Other: None. IMPRESSION: 1. Continued interval evolution of the subacute right PCA distribution infarct, slightly expanded in territory as compared to previous MRI, but overall subacute in appearance with no new areas of superimposed infarction. Associated mild petechial hemorrhage without hemorrhagic transformation. 2. Continued interval evolution of subacute left PCA distribution infarct with associated hemorrhagic transformation, overall decreased in size from previous. 3. Additional evolving subacute ischemic infarcts involving the left frontal white matter and cerebellum, overall stable in size and distribution. 4. Possible one new 4 mm acute ischemic infarct involving the ventromedial left thalamus as above, not entirely certain and could be artifactual in nature. 5. Underlying age-related cerebral atrophy with moderately advanced chronic microvascular ischemic disease 6. Acute ethmoidal and maxillary sinusitis. 7. Right mastoid effusion, similar to previous. Electronically Signed   By: Rise Mu M.D.   On: 12/03/2020 20:45   EEG adult  Result Date: 12/04/2020 Charlsie Quest, MD     12/04/2020  1:43 PM Patient  Name: JULIZZA SASSONE MRN: [id number] Epilepsy Attending: Charlsie Quest Referring Physician/Provider: Dr. Jonah Blue Date: 12/04/2020 Duration: 23.41 minutes Patient history: 87 year old female with recent stroke now with worsening weakness and vision loss.  EEG to evaluate for seizures. Level of alertness: Awake AEDs during EEG study: None Technical aspects: This EEG study was done with scalp electrodes positioned according to the 10-20 International system of electrode placement. Electrical activity was acquired at a sampling rate of 500Hz  and reviewed with a high  frequency filter of 70Hz  and a low frequency filter of 1Hz . EEG data were recorded continuously and digitally stored. Description: The posterior dominant rhythm consists of 8 Hz activity of moderate voltage (25-35 uV) seen predominantly in posterior head regions, symmetric and reactive to eye opening and eye closing.  Intermittent generalized 3 to 5 Hz theta-delta slowing was also noted. Hyperventilation and photic stimulation were not performed.   ABNORMALITY - Intermittent slow, generalized IMPRESSION: This study is suggestive of mild diffuse encephalopathy, nonspecific etiology. No seizures or epileptiform discharges were seen throughout the recording. Priyanka        Scheduled Meds:  apixaban  2.5 mg Oral BID   atorvastatin  20 mg Oral QHS   multivitamin with minerals  1 tablet Oral Daily   Continuous Infusions:  sodium chloride 50 mL/hr at 12/05/20 0057     LOS: 2 days    Time spent: 35-minute    , MD [facility]   To contact the attending provider between 7A-7P or the covering provider during after hours 7P-7A, please log into the web site [email or web address] and access using universal [place] password for that web site. If you do not have the password, please call the hospital operator.  12/05/2020, 6:19 PM

## 2020-12-06 MED ORDER — ATORVASTATIN CALCIUM 10 MG PO TABS: 20.0000 mg | ORAL_TABLET | Freq: Every day | ORAL | 0 refills | Status: AC

## 2020-12-06 NOTE — TOC Transition Note (Signed)
Transition of Care [facility]) - CM/SW Discharge Note   Patient Details  Name: Bridget [name] MRN: [id number] Date of Birth: 05-18-1926  Transition of [facility] CM/SW Contact:  Lawerance Sabal, RN Phone Number: 12/06/2020, 3:03 PM   Clinical Narrative:    [name] w patient's daughter. She states that they would like to continue [facility] services w [facility], they do not need a nurse. Her daughter is a NP and they did not like the [facility] RN they had before. She states her sons in both in the medical profession as well and they will have enough support at home w PT OT.  They have no needs for DME. Referral accepted by [facility]    Final next level of care: Home w Home Health Services Barriers to Discharge: No Barriers Identified   Patient Goals and CMS Choice Patient states their goals for this hospitalization and ongoing recovery are:: to go home CMS Medicare.gov Compare Post Acute Care list provided to:: Other (Comment Required) Choice offered to / list presented to : Adult Children  Discharge Placement                       Discharge Plan and Services                          HH Arranged: PT, OT HH Agency: [facility] Date [facility] Agency Contacted: 12/06/20 Time HH Agency Contacted: [id number] Representative spoke with at [facility] Agency: Kandee Keen  Social Determinants of Health (SDOH) Interventions     Readmission Risk Interventions No flowsheet data found.

## 2020-12-06 NOTE — Discharge Summary (Signed)
Physician Discharge Summary  Bridget [name] ZOX:[id number] DOB: December 31, 1925 DOA: 12/03/2020  PCP: Glori Bickers, MD  Admit date: 12/03/2020 Discharge date: 12/06/2020  Admitted From: Home Disposition: Home with HHS   Home Health:Yes Equipment/Devices:No patient already has bedside commode per daughter Discharge Condition:Stable CODE STATUS: DNR Diet recommendation: Low sodium/ [facility]  Hospital course: 87 y/o female with a past medical history of COPD not on home oxygen supplementation, CKD 3A, Dementia, Afib on Eliquis,Diastolic CHF, HTN, HLD, With recent bilateral posterior cerebral subacute infarcts who presented with sudden onset of cortical blindness and transient left-sided weakness suspicious for new right PCA infarct however MRI scan does not show significant changes compared to previous MRI from a week ago.  Concern for possible unwitnessed seizure-like activity with postictal state however EEG shows mild diffuse encephalopathy and no definite seizures.  Neurology following in consultation and recommended continuing Eliquis for stroke prevention.  No further repeat stroke work-up needed.  She is doing well PT/OT.  They recommend home health PT/OT upon discharge. Daughter is agreeable to this. Of note patient will be resumed on her home lasix PO daily after informing the daughter to continue it over the phone, even though DC instructions have noted it to be stopped.  Discharge Diagnoses:  Principal Problem:   Acute CVA (cerebrovascular accident) [facility]) Active Problems:   Essential hypertension   Atrial fibrillation, chronic (HCC)   CKD (chronic kidney disease), stage III (HCC)   Chronic diastolic CHF (congestive heart failure) (HCC)   Dementia with behavioral disturbance (HCC)   Goals of care, counseling/discussion   DNR (do not resuscitate)    Discharge Instructions  Discharge Instructions     Diet - low sodium heart healthy   Complete by: As directed    Increase activity  slowly   Complete by: As directed       Allergies as of 12/06/2020   No Known Allergies      Medication List     STOP taking these medications    furosemide 40 MG tablet Commonly known as: LASIX   ipratropium-albuterol 0.5-2.5 (3) MG/3ML Soln Commonly known as: DUONEB       TAKE these medications    albuterol 108 (90 Base) MCG/ACT inhaler Commonly known as: VENTOLIN HFA Inhale 2 puffs into the lungs every 6 (six) hours as needed for wheezing or shortness of breath.   apixaban 2.5 MG Tabs tablet Commonly known as: ELIQUIS Take 1 tablet (2.5 mg total) by mouth 2 (two) times daily.   atorvastatin 10 MG tablet Commonly known as: LIPITOR Take 2 tablets (20 mg total) by mouth at bedtime. What changed: how much to take   bisoprolol 5 MG tablet Commonly known as: ZEBETA Take 1 tablet (5 mg total) by mouth daily.        Follow-up Information     Gollan, Tollie Pizza, MD .   Specialty: Cardiology Contact information: [address] [place] [address] [place] [address] [phone]         Kandyce Rud, MD Follow up in 1 week(s).   Specialty: Family Medicine Contact information: [address] [facility] [place] [address] [phone]         Micki Riley, MD Follow up in 1 week(s).   Specialties: Neurology, Radiology Contact information: [address] [place] [address] [phone]                No Known Allergies  Consultations: Neurology  Procedures/Studies: CT ANGIO HEAD NECK W WO CM  Result Date: 11/26/2020 CLINICAL DATA:  Bilateral vision changes EXAM: CT ANGIOGRAPHY HEAD AND NECK CT PERFUSION BRAIN TECHNIQUE: Multidetector CT imaging of the head and neck was performed using the standard protocol during bolus administration of intravenous contrast. Multiplanar CT image reconstructions and MIPs were obtained to evaluate the vascular anatomy. Carotid stenosis  measurements (when applicable) are obtained utilizing NASCET criteria, using the distal internal carotid diameter as the denominator. Multiphase CT imaging of the brain was performed following IV bolus contrast injection. Subsequent parametric perfusion maps were calculated using RAPID software. CONTRAST:  OMNIPAQUE IOHEXOL 350 MG/ML SOLN COMPARISON:  None. FINDINGS: CTA NECK Poor contrast bolus timing with greater contrast in the pulmonary arteries. Aortic arch: Mixed plaque along the aortic arch and patent great vessel origins. There is no high-grade stenosis of the proximal subclavian arteries. Right carotid system: Patent. Calcified plaque at the bifurcation and proximal internal carotid causing less than 50% stenosis. Left carotid system: Patent. Calcified plaque at the bifurcation and proximal internal carotid causing less than 50% stenosis. Vertebral arteries: Patent and codominant. Plaque is present at the left vertebral origin without high-grade stenosis. Skeleton: Degenerative changes of the cervical spine. Other neck: Unremarkable Upper chest: Scarring at the lung apices. Review of the MIP images confirms the above findings CTA HEAD Poor contrast bolus timing particularly limiting more distal evaluation. Anterior circulation: Intracranial internal carotid arteries are patent with calcified plaque causing up to mild to moderate stenosis at the level of the clinoids. Anterior and middle cerebral arteries are patent. Posterior circulation: Intracranial vertebral arteries are patent with calcified plaque causing mild stenosis on the right. Basilar artery is patent. Bilateral PICA origins are patent. Patent left AICA origin noted. Superior cerebellar artery origins are patent. Bilateral P1 and P2 PCA segments are patent. More distal evaluation is limited. Venous sinuses: Patent as allowed by contrast bolus timing. Review of the MIP images confirms the above findings CT Brain Perfusion Findings: CBF  (<30%) Volume: 0mL Perfusion (Tmax>6.0s) volume: 55mL Mismatch Volume: 55mL Infarction Location: Bilateral occipital lobes with some parietal involvement. IMPRESSION: Suboptimal valuation due to poor contrast bolus timing. No large vessel occlusion. Specifically, bilateral P1 and P2 PCA segments are patent. Perfusion imaging demonstrates no evidence of core infarction and 55 mL penumbra involving the bilateral occipital lobes with some parietal involvement. Again, bolus is suboptimal but the area of involvement fits with reported symptoms. Plaque at the ICA origins causes less than 50% stenosis. These results were communicated to Dr. Liliane Channel at 12:28 pm on 11/26/2020 by text page via the [facility] messaging system. Electronically Signed   By: Guadlupe Spanish M.D.   On: 11/26/2020 12:53   CT HEAD WO CONTRAST  Result Date: 11/28/2020 CLINICAL DATA:  87 year old female with bilateral visual changes, acute bilateral posterior circulation infarcts, with hemorrhagic transformation in the medial left PCA territory. EXAM: CT HEAD WITHOUT CONTRAST TECHNIQUE: Contiguous axial images were obtained from the base of the skull through the vertex without intravenous contrast. COMPARISON:  Brain MRI 11/27/2020 and earlier. FINDINGS: Brain: Small volume of intra-axial hemorrhage along the medial left occipital lobe is stable and size and configuration from the MRI yesterday on series 3, image 16. Patchy cytotoxic edema here, in the contralateral medial right occipital lobe, and with small foci scattered in the bilateral cerebellum are stable from the DWI appearance yesterday. No new areas of associated hemorrhage. No intraventricular or extra-axial extension of blood. No significant intracranial mass effect at  this time. No ventriculomegaly. Stable patchy hemispheric white matter hypodensity elsewhere. Normal basilar cisterns. Vascular: Calcified atherosclerosis at the skull base. Skull: No acute osseous abnormality identified.  Sinuses/Orbits: Bilateral sinus air-fluid levels and mucosal thickening are stable since 11/26/2020. Tympanic cavities and mastoids remain clear. Other: Orbit and scalp soft tissues remain negative. IMPRESSION: 1. Left PCA territory infarct with stable confluent petechial hemorrhage since the MRI yesterday. 2. Stable confluent medial Right PCA territory and scattered bilateral cerebellar infarcts since yesterday without new hemorrhagic transformation. 3. No significant intracranial mass effect. No new intracranial abnormality. Electronically Signed   By: Odessa FlemingH  Hall M.D.   On: 11/28/2020 05:25   CT HEAD WO CONTRAST  Result Date: 11/26/2020 CLINICAL DATA:  Altered mental status, visual loss EXAM: CT HEAD WITHOUT CONTRAST TECHNIQUE: Contiguous axial images were obtained from the base of the skull through the vertex without intravenous contrast. COMPARISON:  None. FINDINGS: Brain: No evidence of acute infarction, hemorrhage, hydrocephalus, extra-axial collection or mass lesion/mass effect. Mild low-density changes within the periventricular and subcortical white matter compatible with chronic microvascular ischemic change. Mild diffuse cerebral volume loss. Vascular: Atherosclerotic calcifications involving the large vessels of the skull base. No unexpected hyperdense vessel. Skull: Normal. Negative for fracture or focal lesion. Sinuses/Orbits: Mucosal thickening with air-fluid levels involving the bilateral maxillary sinuses, sphenoid sinuses, and ethmoid air cells. Mastoid air cells are clear. Orbital structures unremarkable. Other: None. IMPRESSION: 1. No acute intracranial findings by CT. 2. Mild chronic microvascular ischemic change and cerebral volume loss. 3. Paranasal sinus disease with air-fluid levels. Correlate for signs and symptoms of acute sinusitis. Electronically Signed   By: Duanne GuessNicholas  Plundo D.O.   On: 11/26/2020 11:34   MR BRAIN WO CONTRAST  Result Date: 12/03/2020 CLINICAL DATA:  Initial  evaluation for stroke suspected. EXAM: MRI HEAD WITHOUT CONTRAST TECHNIQUE: Multiplanar, multiecho pulse sequences of the brain and surrounding structures were obtained without intravenous contrast. COMPARISON:  Prior CTs from earlier the same day as well as recent MRI from 11/27/2020. FINDINGS: Brain: Examination mildly degraded by motion artifact. Diffuse prominence of the CSF containing spaces compatible generalized age-related cerebral atrophy. Patchy and confluent T2/FLAIR hyperintensity within the periventricular deep white matter both cerebral hemispheres as well as the pons, most consistent with chronic small vessel ischemic disease, moderately advanced in nature. There has been continued interval evolution of the subacute left PCA distribution infarct with associated hemorrhagic transformation. Appearance is within normal limits for normal expected interval evolution. Associated hemorrhage now demonstrates intrinsic T1 hyperintensity and is slightly smaller as compared to previous MRI, now measuring approximately 3.5 x 2.0 cm (series 8, image 26). No significant regional mass effect. Continued interval evolution of subacute right PCA distribution infarct. Area of involvement has slightly expanded as compared to previous MRI, with further lateral extension into peripheral right temporal occipital region as compared to previous (series 2, image 21). Overall appearance is subacute however, with no new superimposed areas of acute infarction. Associated mild petechial hemorrhage without frank hemorrhagic transformation or significant mass effect. Additional subacute cerebellar infarcts are overall similar in size from previous MRI, with no new areas of infarction. No associated hemorrhage or mass effect. Additional punctate focus of subacute ischemia at the anterior left frontal lobe noted as well, also stable from prior (series 2, image 37). There may be 1 new punctate 4 mm focus of diffusion abnormality  involving the ventral medial left thalamus, not entirely certain as this is only seen on axial DWI sequence (series 2, image 32), and  not seen on corresponding coronal sequence or ADC map. No other new areas of acute ischemia. Gray-white matter differentiation otherwise maintained. No other evidence for acute intracranial hemorrhage. Additional subcentimeter chronic microhemorrhage noted at the anterior left frontal corona radiata, likely small vessel related. No mass lesion, midline shift or mass effect. No hydrocephalus or extra-axial fluid collection. Pituitary gland suprasellar region within normal limits. Midline structures intact. Vascular: Major intracranial vascular flow voids are maintained at the skull base. Skull and upper cervical spine: Craniocervical junction within normal limits. Bone marrow signal intensity normal. No scalp soft tissue abnormality. Sinuses/Orbits: Patient status post ocular lens replacement on the left. Globes and orbital soft tissues demonstrate no acute finding. Scattered mucosal thickening noted throughout the sphenoid ethmoidal and maxillary sinuses. Superimposed small air-fluid levels noted within the maxillary sinuses. Right mastoid effusion noted. Visualized nasopharynx unremarkable. Inner ear structures grossly normal. Other: None. IMPRESSION: 1. Continued interval evolution of the subacute right PCA distribution infarct, slightly expanded in territory as compared to previous MRI, but overall subacute in appearance with no new areas of superimposed infarction. Associated mild petechial hemorrhage without hemorrhagic transformation. 2. Continued interval evolution of subacute left PCA distribution infarct with associated hemorrhagic transformation, overall decreased in size from previous. 3. Additional evolving subacute ischemic infarcts involving the left frontal white matter and cerebellum, overall stable in size and distribution. 4. Possible one new 4 mm acute ischemic  infarct involving the ventromedial left thalamus as above, not entirely certain and could be artifactual in nature. 5. Underlying age-related cerebral atrophy with moderately advanced chronic microvascular ischemic disease 6. Acute ethmoidal and maxillary sinusitis. 7. Right mastoid effusion, similar to previous. Electronically Signed   By: Rise Mu M.D.   On: 12/03/2020 20:45   MR BRAIN WO CONTRAST  Result Date: 11/27/2020 CLINICAL DATA:  Stroke follow-up.  Status post tPA. EXAM: MRI HEAD WITHOUT CONTRAST TECHNIQUE: Multiplanar, multiecho pulse sequences of the brain and surrounding structures were obtained without intravenous contrast. COMPARISON:  MR head without contrast 11/26/2020 FINDINGS: Brain: Areas of acute/subacute infarct involving the PCA distributions bilaterally are stable in size. Interval parenchymal hemorrhage is present in the medial left occipital lobe infarct measuring 1.9 x 3.8 x 2.0 cm. No other significant hemorrhage is present. Punctate nonhemorrhagic infarct in the left frontal lobe white matter is stable. Extensive T2 signal changes associated with the areas of acute/subacute infarct again noted. Moderate periventricular and subcortical white matter changes are present otherwise. White matter changes extend into the brainstem. Remote lacunar infarcts are present in the cerebellum bilaterally. Vascular: Flow is present in the major intracranial arteries. Skull and upper cervical spine: The craniocervical junction is normal. Upper cervical spine is within normal limits. Marrow signal is unremarkable. Sinuses/Orbits: Fluid levels are present the maxillary sinuses bilaterally, bilateral sphenoid sinuses, and bilateral ethmoid air cells. Frontal sinuses are clear. Right mastoid effusion is present. No obstructing nasopharyngeal lesion is evident. IMPRESSION: 1. Stable size of acute/subacute infarcts involving the PCA distributions bilaterally. No new infarcts 2. Interval  parenchymal hemorrhage in the medial left occipital lobe infarct. 3. Stable punctate nonhemorrhagic infarct in the left frontal lobe white matter. 4. Remote lacunar infarcts of the cerebellum bilaterally. 5. Moderate diffuse white matter disease likely reflects the sequela of chronic microvascular ischemia 6. Diffuse sinus disease. 7. Right mastoid effusion. No obstructing nasopharyngeal lesion is evident. Electronically Signed   By: Marin Roberts M.D.   On: 11/27/2020 15:05   MR BRAIN WO CONTRAST  Result Date: 11/26/2020 CLINICAL DATA:  Bilateral abnormal vision, abnormal CTP EXAM: MRI HEAD WITHOUT CONTRAST TECHNIQUE: Multiplanar, multiecho pulse sequences of the brain and surrounding structures were obtained without intravenous contrast tailored for acute stroke evaluation. COMPARISON:  None. FINDINGS: Diffusion, SWI, and axial T2 FLAIR sequences were obtained. There is cortical/subcortical reduced diffusion and bilateral occipital lobes, left greater than right. There is also involvement of the posteromedial right temporal lobe. Bilateral small foci of reduced diffusion are present in the cerebellum. A punctate focus of reduced diffusion is present in the anterior left frontal white matter. No significant mass effect. No definite hemorrhage. Curvilinear susceptibility in a left occipital sulcus could reflect thrombus. IMPRESSION: Acute posterior circulation infarcts involving bilateral occipital lobes, right posteromedial temporal lobe, and cerebellum. Punctate acute infarct of the left frontal white matter. Electronically Signed   By: Guadlupe Spanish M.D.   On: 11/26/2020 12:59   CT CEREBRAL PERFUSION W CONTRAST  Result Date: 11/26/2020 CLINICAL DATA:  Bilateral vision changes EXAM: CT ANGIOGRAPHY HEAD AND NECK CT PERFUSION BRAIN TECHNIQUE: Multidetector CT imaging of the head and neck was performed using the standard protocol during bolus administration of intravenous contrast. Multiplanar CT  image reconstructions and MIPs were obtained to evaluate the vascular anatomy. Carotid stenosis measurements (when applicable) are obtained utilizing NASCET criteria, using the distal internal carotid diameter as the denominator. Multiphase CT imaging of the brain was performed following IV bolus contrast injection. Subsequent parametric perfusion maps were calculated using RAPID software. CONTRAST:  OMNIPAQUE IOHEXOL 350 MG/ML SOLN COMPARISON:  None. FINDINGS: CTA NECK Poor contrast bolus timing with greater contrast in the pulmonary arteries. Aortic arch: Mixed plaque along the aortic arch and patent great vessel origins. There is no high-grade stenosis of the proximal subclavian arteries. Right carotid system: Patent. Calcified plaque at the bifurcation and proximal internal carotid causing less than 50% stenosis. Left carotid system: Patent. Calcified plaque at the bifurcation and proximal internal carotid causing less than 50% stenosis. Vertebral arteries: Patent and codominant. Plaque is present at the left vertebral origin without high-grade stenosis. Skeleton: Degenerative changes of the cervical spine. Other neck: Unremarkable Upper chest: Scarring at the lung apices. Review of the MIP images confirms the above findings CTA HEAD Poor contrast bolus timing particularly limiting more distal evaluation. Anterior circulation: Intracranial internal carotid arteries are patent with calcified plaque causing up to mild to moderate stenosis at the level of the clinoids. Anterior and middle cerebral arteries are patent. Posterior circulation: Intracranial vertebral arteries are patent with calcified plaque causing mild stenosis on the right. Basilar artery is patent. Bilateral PICA origins are patent. Patent left AICA origin noted. Superior cerebellar artery origins are patent. Bilateral P1 and P2 PCA segments are patent. More distal evaluation is limited. Venous sinuses: Patent as allowed by contrast bolus  timing. Review of the MIP images confirms the above findings CT Brain Perfusion Findings: CBF (<30%) Volume: 0mL Perfusion (Tmax>6.0s) volume: 55mL Mismatch Volume: 55mL Infarction Location: Bilateral occipital lobes with some parietal involvement. IMPRESSION: Suboptimal valuation due to poor contrast bolus timing. No large vessel occlusion. Specifically, bilateral P1 and P2 PCA segments are patent. Perfusion imaging demonstrates no evidence of core infarction and 55 mL penumbra involving the bilateral occipital lobes with some parietal involvement. Again, bolus is suboptimal but the area of involvement fits with reported symptoms. Plaque at the ICA origins causes less than 50% stenosis. These results were communicated to Dr. Liliane Channel at 12:28 pm on 11/26/2020 by text page via the [facility] messaging system. Electronically Signed  By: Guadlupe Spanish M.D.   On: 11/26/2020 12:53   DG Chest Portable 1 View  Result Date: 11/13/2020 CLINICAL DATA:  COPD exacerbation. EXAM: PORTABLE CHEST 1 VIEW COMPARISON:  11/07/2020 FINDINGS: Cardiomegaly. Aortic atherosclerosis. Biapical scarring. No acute confluent opacities or effusions. Mild hyperinflation. No acute bony abnormality. IMPRESSION: Cardiomegaly, COPD.  Chronic changes.  No active disease. Electronically Signed   By: Charlett Nose M.D.   On: 11/13/2020 21:23   DG Chest Portable 1 View  Result Date: 11/07/2020 CLINICAL DATA:  Shortness of breath EXAM: PORTABLE CHEST 1 VIEW COMPARISON:  08/31/2020 FINDINGS: Defibrillator pads overlying the mediastinum and left lower hemithorax. Lungs are essentially clear.  No pleural effusion or pneumothorax. The heart is normal in size. IMPRESSION: No evidence of acute cardiopulmonary disease. Electronically Signed   By: Charline Bills M.D.   On: 11/07/2020 06:48   EEG adult  Result Date: 12/04/2020 Charlsie Quest, MD     12/04/2020  1:43 PM Patient Name: Bridget [name] MRN: [id number] Epilepsy Attending: Charlsie Quest  Referring Physician/Provider: Dr. Jonah Blue Date: 12/04/2020 Duration: 23.41 minutes Patient history: 87 year old female with recent stroke now with worsening weakness and vision loss.  EEG to evaluate for seizures. Level of alertness: Awake AEDs during EEG study: None Technical aspects: This EEG study was done with scalp electrodes positioned according to the 10-20 International system of electrode placement. Electrical activity was acquired at a sampling rate of 500Hz  and reviewed with a high frequency filter of 70Hz  and a low frequency filter of 1Hz . EEG data were recorded continuously and digitally stored. Description: The posterior dominant rhythm consists of 8 Hz activity of moderate voltage (25-35 uV) seen predominantly in posterior head regions, symmetric and reactive to eye opening and eye closing.  Intermittent generalized 3 to 5 Hz theta-delta slowing was also noted. Hyperventilation and photic stimulation were not performed.   ABNORMALITY - Intermittent slow, generalized IMPRESSION: This study is suggestive of mild diffuse encephalopathy, nonspecific etiology. No seizures or epileptiform discharges were seen throughout the recording.   ECHOCARDIOGRAM COMPLETE BUBBLE STUDY  Result Date: 11/29/2020    ECHOCARDIOGRAM REPORT   Patient Name:   Bridget [name] Date of Exam: 11/29/2020 Medical Rec #:  12/01/2020      Height:       64.0 in Accession #:    Dolores Patty     Weight:       129.6 lb Date of Birth:  04/05/1926      BSA:          1.627 m Patient Age:    89 years       BP:           119/66 mmHg Patient Gender: F              HR:           59 bpm. Exam Location:  Inpatient Procedure: 2D Echo, Cardiac Doppler and Color Doppler Indications:    Stroke  History:        Patient has prior history of Echocardiogram examinations, most                 recent 11/09/2020. Risk Factors:Hypertension.  Sonographer:    [id number] Referring Phys: 11/09/1925 JAMIE H ALDRIDGE IMPRESSIONS  1. Left  ventricular ejection fraction, by estimation, is 65 to 70%. The left ventricle has normal function. The left ventricle has no regional wall motion abnormalities. There is mild left ventricular hypertrophy.  Left ventricular diastolic function could not be evaluated.  2. Right ventricular systolic function is normal. The right ventricular size is normal. There is moderately elevated pulmonary artery systolic pressure.  3. Left atrial size was moderately dilated.  4. Right atrial size was mildly dilated.  5. The mitral valve is abnormal. Moderate mitral valve regurgitation. There is mild late systolic prolapse of both leaflets of the mitral valve.  6. The aortic valve is tricuspid. Aortic valve regurgitation is mild. Mild aortic valve sclerosis is present, with no evidence of aortic valve stenosis. Aortic regurgitation PHT measures 520 msec.  7. Rouleaux formation in the IVC suggestive of sluggish venous flow/venous stasis. The inferior vena cava is dilated in size with <50% respiratory variability, suggesting right atrial pressure of 15 mmHg. Comparison(s): Changes from prior study are noted. 11/09/2020: LVEF 55%, mild LAE, moderate RAE. FINDINGS  Left Ventricle: Left ventricular ejection fraction, by estimation, is 65 to 70%. The left ventricle has normal function. The left ventricle has no regional wall motion abnormalities. The left ventricular internal cavity size was normal in size. There is  mild left ventricular hypertrophy. Left ventricular diastolic function could not be evaluated due to atrial fibrillation. Left ventricular diastolic function could not be evaluated. Right Ventricle: The right ventricular size is normal. No increase in right ventricular wall thickness. Right ventricular systolic function is normal. There is moderately elevated pulmonary artery systolic pressure. The tricuspid regurgitant velocity is 3.31 m/s, and with an assumed right atrial pressure of 15 mmHg, the estimated right ventricular  systolic pressure is 58.8 mmHg. Left Atrium: Left atrial size was moderately dilated. Right Atrium: Right atrial size was mildly dilated. Pericardium: There is no evidence of pericardial effusion. Mitral Valve: The mitral valve is abnormal. There is mild late systolic prolapse of both leaflets of the mitral valve. There is mild thickening of the posterior and anterior mitral valve leaflet(s). Moderate mitral valve regurgitation, with centrally-directed jet. Tricuspid Valve: The tricuspid valve is grossly normal. Tricuspid valve regurgitation is trivial. Aortic Valve: The aortic valve is tricuspid. Aortic valve regurgitation is mild. Aortic regurgitation PHT measures 520 msec. Mild aortic valve sclerosis is present, with no evidence of aortic valve stenosis. Pulmonic Valve: The pulmonic valve was grossly normal. Pulmonic valve regurgitation is trivial. Aorta: The aortic root and ascending aorta are structurally normal, with no evidence of dilitation. Venous: Rouleaux formation in the IVC suggestive of sluggish venous flow/venous stasis. The inferior vena cava is dilated in size with less than 50% respiratory variability, suggesting right atrial pressure of 15 mmHg. IAS/Shunts: No atrial level shunt detected by color flow Doppler.  LEFT VENTRICLE PLAX 2D LVIDd:         4.10 cm LVIDs:         2.50 cm LV PW:         0.90 cm LV IVS:        1.20 cm LVOT diam:     1.70 cm LV SV:         27 LV SV Index:   17 LVOT Area:     2.27 cm  RIGHT VENTRICLE          IVC RV Basal diam:  4.80 cm  IVC diam: 2.20 cm LEFT ATRIUM             Index       RIGHT ATRIUM           Index LA diam:        3.50 cm 2.15  cm/m  RA Area:     19.60 cm LA Vol (A2C):   62.2 ml 38.23 ml/m RA Volume:   51.20 ml  31.47 ml/m LA Vol (A4C):   67.3 ml 41.36 ml/m LA Biplane Vol: 66.4 ml 40.81 ml/m  AORTIC VALVE LVOT Vmax:   78.80 cm/s LVOT Vmean:  43.800 cm/s LVOT VTI:    0.121 m AI PHT:      520 msec  AORTA Ao Root diam: 3.60 cm Ao Asc diam:  3.40 cm  MITRAL VALVE               TRICUSPID VALVE MV Area (PHT): 4.52 cm    TR Peak grad:   43.8 mmHg MV Decel Time: 168 msec    TR Vmax:        331.00 cm/s MV E velocity: 90.70 cm/s                            SHUNTS                            Systemic VTI:  0.12 m                            Systemic Diam: 1.70 cm Zoila Shutter MD Electronically signed by Zoila Shutter MD Signature Date/Time: 11/29/2020/12:54:39 PM    Final    CT HEAD CODE STROKE WO CONTRAST  Result Date: 12/03/2020 CLINICAL DATA:  Code stroke.  Acute neuro deficit.  Recent stroke. EXAM: CT HEAD WITHOUT CONTRAST TECHNIQUE: Contiguous axial images were obtained from the base of the skull through the vertex without intravenous contrast. COMPARISON:  CT head 11/28/2020, MRI head 11/27/2020 FINDINGS: Brain: Progressive cytotoxic edema in the right occipital lobe compatible with recent infarction. This was present previously but appears larger in territory which may be due to progressive edema or extension of infarction. No associated hemorrhage Cytotoxic edema infarct in the left occipital lobe with hemorrhagic transformation similar to the prior study. No new hemorrhage Recent infarct in the right lateral cerebellum best seen on MRI. No definite change. Generalized atrophy. Chronic microvascular ischemic changes throughout the white matter as noted previously. Vascular: Negative for hyperdense vessel Skull: No acute abnormality Sinuses/Orbits: Multiple air-fluid levels in the sinuses. No acute orbital abnormality. Left cataract extraction. Other: None IMPRESSION: 1. Right PCA infarct shows progressive edema. This may be due to evolving infarct versus extension of infarct. No associated hemorrhage in the right occipital lobe. 2. Infarct in the left occipital lobe with hemorrhagic transformation unchanged from the recent CT. 3. Recent infarct in the right cerebellum appears stable. Electronically Signed   By: Marlan Palau M.D.   On: 12/03/2020 15:11    ECHOCARDIOGRAM LIMITED  Result Date: 11/09/2020    ECHOCARDIOGRAM LIMITED REPORT   Patient Name:   Bridget [name] Date of Exam: 11/09/2020 Medical Rec #:  [id number]      Height:       64.0 in Accession #:    [id number]     Weight:       125.0 lb Date of Birth:  [birth year]      BSA:          1.602 m Patient Age:    89 years       BP:           103/60 mmHg Patient Gender: F  HR:           77 bpm. Exam Location:  [facility] Procedure: 2D Echo Indications:     Dyspnea R06.00  History:         Patient has prior history of Echocardiogram examinations, most                  recent 08/31/2020.  Sonographer:     Wonda Cerise Referring Phys:  [id number] CHRISTOPHER RONALD BERGE Diagnosing Phys: Julien Nordmann MD IMPRESSIONS  1. Left ventricular ejection fraction, by estimation, is 55 %. The left ventricle has normal function. The left ventricle has no regional wall motion abnormalities.  2. Right ventricular systolic function is normal. The right ventricular size is mildly enlarged. There is moderately elevated pulmonary artery systolic pressure. The estimated right ventricular systolic pressure is 56.0 mmHg.  3. Left atrial size was mildly dilated.  4. Right atrial size was moderately dilated.  5. The mitral valve is normal in structure. Mild to moderate mitral valve regurgitation  6. The inferior vena cava is dilated in size with <50% respiratory variability, suggesting right atrial pressure of 15 mmHg. FINDINGS  Left Ventricle: Left ventricular ejection fraction, by estimation, is 55 %. The left ventricle has normal function. The left ventricle has no regional wall motion abnormalities. The left ventricular internal cavity size was normal in size. There is no left ventricular hypertrophy. Right Ventricle: The right ventricular size is mildly enlarged. No increase in right ventricular wall thickness. Right ventricular systolic function is normal. There is moderately elevated pulmonary artery systolic pressure. The  tricuspid regurgitant velocity is 3.20 m/s, and with an assumed right atrial pressure of 15 mmHg, the estimated right ventricular systolic pressure is 56.0 mmHg. Left Atrium: Left atrial size was mildly dilated. Right Atrium: Right atrial size was moderately dilated. Pericardium: There is no evidence of pericardial effusion. Mitral Valve: The mitral valve is normal in structure. Mild to moderate mitral valve regurgitation. No evidence of mitral valve stenosis. Tricuspid Valve: The tricuspid valve is normal in structure. Tricuspid valve regurgitation is mild . No evidence of tricuspid stenosis. Aortic Valve: The aortic valve is normal in structure. Aortic valve regurgitation is not visualized. No aortic stenosis is present. Aortic valve peak gradient measures 5.0 mmHg. Pulmonic Valve: The pulmonic valve was normal in structure. Pulmonic valve regurgitation is not visualized. No evidence of pulmonic stenosis. Aorta: The aortic root is normal in size and structure. Venous: The inferior vena cava is dilated in size with less than 50% respiratory variability, suggesting right atrial pressure of 15 mmHg. IAS/Shunts: No atrial level shunt detected by color flow Doppler. LEFT VENTRICLE PLAX 2D LVIDd:         3.93 cm Diastology LVIDs:         2.64 cm LV e' medial:  5.00 cm/s LV PW:         1.07 cm LV e' lateral: 6.96 cm/s LV IVS:        1.19 cm  LEFT ATRIUM         Index      RIGHT ATRIUM           Index LA diam:    4.70 cm 2.93 cm/m RA Area:     24.80 cm                                RA Volume:   82.70 ml  51.62 ml/m  AORTIC  VALVE AV Vmax:      112.00 cm/s AV Peak Grad: 5.0 mmHg TRICUSPID VALVE TV Peak grad:   38.7 mmHg TV Vmax:        3.11 m/s TR Peak grad:   41.0 mmHg TR Vmax:        320.00 cm/s Julien Nordmann MD Electronically signed by Julien Nordmann MD Signature Date/Time: 11/09/2020/12:23:50 PM    Final    CT ANGIO HEAD CODE STROKE  Addendum Date: 12/03/2020   ADDENDUM REPORT: 12/03/2020 20:44 ADDENDUM: CTA  head impressions #2 and #3 discussed with Dr. Pearlean Brownie via telephone at 4 p.m. on 12/03/2020. Electronically Signed   By: Jackey Loge DO   On: 12/03/2020 20:44   Result Date: 12/03/2020 CLINICAL DATA:  Neuro deficit, acute, stroke suspected. Stroke/TIA, assess extracranial arteries. EXAM: CT ANGIOGRAPHY HEAD AND NECK TECHNIQUE: Multidetector CT imaging of the head and neck was performed using the standard protocol during bolus administration of intravenous contrast. Multiplanar CT image reconstructions and MIPs were obtained to evaluate the vascular anatomy. Carotid stenosis measurements (when applicable) are obtained utilizing NASCET criteria, using the distal internal carotid diameter as the denominator. CONTRAST:  75mL OMNIPAQUE IOHEXOL 350 MG/ML SOLN COMPARISON:  Noncontrast head CT examinations 12/03/2020 and earlier. CT angiogram head/neck 11/26/2020. FINDINGS: CTA NECK FINDINGS Aortic arch: Common origin of the innominate and left common carotid arteries. Atherosclerotic plaque within the visualized aortic arch and proximal major branch vessels of the neck. No hemodynamically significant innominate or proximal subclavian artery stenosis. Right carotid system: CCA and ICA patent within the neck. As before, there is soft and calcified plaque within the carotid bifurcation proximal ICA with less than 50% stenosis of the proximal ICA. Left carotid system: CCA and ICA patent within the neck. As before, there is scattered soft calcified plaque within the mid to distal CCA and within the carotid bifurcation. No measurable stenosis of the proximal left ICA. Vertebral arteries: Codominant and patent within the neck. Nonstenotic atherosclerotic plaque at both vertebral artery origins. Skeleton: No acute bony abnormality or aggressive osseous lesion. Cervical spondylosis Other neck: No neck mass or cervical lymphadenopathy. Upper chest: No consolidation within the imaged lung apices. Biapical pleuroparenchymal scarring.  Calcified granuloma within the periphery of the right upper lobe. Review of the MIP images confirms the above findings CTA HEAD FINDINGS Evaluation of the intracranial arterial circulation is limited due to poor arterial contrast-enhancement. Anterior circulation: The intracranial internal carotid arteries are patent. Calcified plaque within both vessels with up to moderate stenosis within the distal cavernous/paraclinoid segments bilaterally. The M1 middle cerebral arteries are patent. Within described limitations, no definite proximal M2 branch occlusion or high-grade proximal M2 stenosis is identified. The anterior cerebral arteries are patent. Posterior circulation: The intracranial vertebral arteries are patent. Unchanged calcified plaque within the V4 right vertebral artery with resultant up to moderate stenosis. The basilar artery is patent. New from the prior examination, there is occlusion of a right PCA branch at the P2 segment level (series 13, image 24). (Series 11, image 20). The left posterior cerebral artery is patent. Severe stenosis within the proximal P3 left PCA, not definitively present on the prior CTA of 11/26/2020. Venous sinuses: Within the limitations of contrast timing, no convincing thrombus. Anatomic variants: Posterior communicating arteries are hypoplastic or absent bilaterally. Review of the MIP images confirms the above findings IMPRESSION: CTA neck: The common carotid, internal carotid and vertebral arteries are patent within the neck without hemodynamically significant stenosis (50% or greater). Arterial atherosclerotic disease within  the neck, as described. CTA head: 1. Evaluation is limited due to poor intracranial arterial contrast enhancement. 2. However, a new right PCA branch occlusion is identified (at the P2 segment level). 3. Additionally, there is a severe stenosis within the proximal P3 segment of the left posterior cerebral artery, which was not definitively present on  the prior CTA of 11/26/2020. 4. Additional intracranial atherosclerotic disease, as described, with sites of up to moderate stenosis in the bilateral intracranial ICAs and V4 right vertebral artery. Electronically Signed: By: Jackey Loge DO On: 12/03/2020 15:56   CT ANGIO NECK CODE STROKE  Addendum Date: 12/03/2020   ADDENDUM REPORT: 12/03/2020 20:44 ADDENDUM: CTA head impressions #2 and #3 discussed with Dr. Pearlean Brownie via telephone at 4 p.m. on 12/03/2020. Electronically Signed   By: Jackey Loge DO   On: 12/03/2020 20:44   Result Date: 12/03/2020 CLINICAL DATA:  Neuro deficit, acute, stroke suspected. Stroke/TIA, assess extracranial arteries. EXAM: CT ANGIOGRAPHY HEAD AND NECK TECHNIQUE: Multidetector CT imaging of the head and neck was performed using the standard protocol during bolus administration of intravenous contrast. Multiplanar CT image reconstructions and MIPs were obtained to evaluate the vascular anatomy. Carotid stenosis measurements (when applicable) are obtained utilizing NASCET criteria, using the distal internal carotid diameter as the denominator. CONTRAST:  75mL OMNIPAQUE IOHEXOL 350 MG/ML SOLN COMPARISON:  Noncontrast head CT examinations 12/03/2020 and earlier. CT angiogram head/neck 11/26/2020. FINDINGS: CTA NECK FINDINGS Aortic arch: Common origin of the innominate and left common carotid arteries. Atherosclerotic plaque within the visualized aortic arch and proximal major branch vessels of the neck. No hemodynamically significant innominate or proximal subclavian artery stenosis. Right carotid system: CCA and ICA patent within the neck. As before, there is soft and calcified plaque within the carotid bifurcation proximal ICA with less than 50% stenosis of the proximal ICA. Left carotid system: CCA and ICA patent within the neck. As before, there is scattered soft calcified plaque within the mid to distal CCA and within the carotid bifurcation. No measurable stenosis of the proximal left  ICA. Vertebral arteries: Codominant and patent within the neck. Nonstenotic atherosclerotic plaque at both vertebral artery origins. Skeleton: No acute bony abnormality or aggressive osseous lesion. Cervical spondylosis Other neck: No neck mass or cervical lymphadenopathy. Upper chest: No consolidation within the imaged lung apices. Biapical pleuroparenchymal scarring. Calcified granuloma within the periphery of the right upper lobe. Review of the MIP images confirms the above findings CTA HEAD FINDINGS Evaluation of the intracranial arterial circulation is limited due to poor arterial contrast-enhancement. Anterior circulation: The intracranial internal carotid arteries are patent. Calcified plaque within both vessels with up to moderate stenosis within the distal cavernous/paraclinoid segments bilaterally. The M1 middle cerebral arteries are patent. Within described limitations, no definite proximal M2 branch occlusion or high-grade proximal M2 stenosis is identified. The anterior cerebral arteries are patent. Posterior circulation: The intracranial vertebral arteries are patent. Unchanged calcified plaque within the V4 right vertebral artery with resultant up to moderate stenosis. The basilar artery is patent. New from the prior examination, there is occlusion of a right PCA branch at the P2 segment level (series 13, image 24). (Series 11, image 20). The left posterior cerebral artery is patent. Severe stenosis within the proximal P3 left PCA, not definitively present on the prior CTA of 11/26/2020. Venous sinuses: Within the limitations of contrast timing, no convincing thrombus. Anatomic variants: Posterior communicating arteries are hypoplastic or absent bilaterally. Review of the MIP images confirms the above findings IMPRESSION: CTA neck:  The common carotid, internal carotid and vertebral arteries are patent within the neck without hemodynamically significant stenosis (50% or greater). Arterial  atherosclerotic disease within the neck, as described. CTA head: 1. Evaluation is limited due to poor intracranial arterial contrast enhancement. 2. However, a new right PCA branch occlusion is identified (at the P2 segment level). 3. Additionally, there is a severe stenosis within the proximal P3 segment of the left posterior cerebral artery, which was not definitively present on the prior CTA of 11/26/2020. 4. Additional intracranial atherosclerotic disease, as described, with sites of up to moderate stenosis in the bilateral intracranial ICAs and V4 right vertebral artery. Electronically Signed: By: Jackey Loge DO On: 12/03/2020 15:56      Discharge Exam: Vitals:   12/06/20 1241 12/06/20 1534  BP: 110/68 (!) 96/52  Pulse: 60 (!) 59  Resp: 16 16  Temp: 97.6 F (36.4 C) 97.9 F (36.6 C)  SpO2: (!) 88% (!) 80%   Vitals:   12/05/20 2356 12/06/20 0737 12/06/20 1241 12/06/20 1534  BP: 136/76 109/73 110/68 (!) 96/52  Pulse: 61 (!) 59 60 (!) 59  Resp: 18 16 16 16   Temp:  98.2 F (36.8 C) 97.6 F (36.4 C) 97.9 F (36.6 C)  TempSrc:   Oral Oral  SpO2: 100%  (!) 88% (!) 80%    General: Appears well-developed; no acute distress. Psych: Affect appropriate to situation Eyes: No scleral injection HENT: No OP obstrucion Head: Normocephalic. Cardiovascular: Irregular; rate normal Respiratory: Effort normal and breath sounds normal to anterior ascultation GI: Soft.  No distension. There is no tenderness. Skin: WDI      The results of significant diagnostics from this hospitalization (including imaging, microbiology, ancillary and laboratory) are listed below for reference.     Microbiology: Recent Results (from the past 240 hour(s))  MRSA PCR Screening     Status: None   Collection Time: 11/26/20  5:09 PM  Result Value Ref Range Status   MRSA by PCR NEGATIVE NEGATIVE Final    Comment:        The GeneXpert MRSA Assay (FDA approved for NASAL specimens only), is one component of  a comprehensive MRSA colonization surveillance program. It is not intended to diagnose MRSA infection nor to guide or monitor treatment for MRSA infections. Performed at [facility], [address], [place], [place]   Resp Panel by RT-PCR (Flu A&B, Covid) Nasopharyngeal Swab     Status: None   Collection Time: 12/03/20  7:16 PM   Specimen: Nasopharyngeal Swab; Nasopharyngeal(NP) swabs in vial transport medium  Result Value Ref Range Status   SARS Coronavirus 2 by RT PCR NEGATIVE NEGATIVE Final    Comment: (NOTE) SARS-CoV-2 target nucleic acids are NOT DETECTED.  The SARS-CoV-2 RNA is generally detectable in upper respiratory specimens during the acute phase of infection. The lowest concentration of SARS-CoV-2 viral copies this assay can detect is 138 copies/mL. A negative result does not preclude SARS-Cov-2 infection and should not be used as the sole basis for treatment or other patient management decisions. A negative result may occur with  improper specimen collection/handling, submission of specimen other than nasopharyngeal swab, presence of viral mutation(s) within the areas targeted by this assay, and inadequate number of viral copies(<138 copies/mL). A negative result must be combined with clinical observations, patient history, and epidemiological information. The expected result is Negative.  Fact Sheet for Patients:  12/05/20  Fact Sheet for Healthcare Providers:  BloggerCourse.com  This test is no  t yet approved or cleared by the [place] and  has been authorized for detection and/or diagnosis of SARS-CoV-2 by FDA under an Emergency Use Authorization (EUA). This EUA will remain  in effect (meaning this test can be used) for the duration of the COVID-19 declaration under Section 564(b)(1) of the Act, 21 U.S.C.section 360bbb-3(b)(1), unless the authorization is terminated  or revoked  sooner.       Influenza A by PCR NEGATIVE NEGATIVE Final   Influenza B by PCR NEGATIVE NEGATIVE Final    Comment: (NOTE) The Xpert Xpress SARS-CoV-2/FLU/RSV plus assay is intended as an aid in the diagnosis of influenza from Nasopharyngeal swab specimens and should not be used as a sole basis for treatment. Nasal washings and aspirates are unacceptable for Xpert Xpress SARS-CoV-2/FLU/RSV testing.  Fact Sheet for Patients: BloggerCourse.com  Fact Sheet for Healthcare Providers: SeriousBroker.it  This test is not yet approved or cleared by the [place] FDA and has been authorized for detection and/or diagnosis of SARS-CoV-2 by FDA under an Emergency Use Authorization (EUA). This EUA will remain in effect (meaning this test can be used) for the duration of the COVID-19 declaration under Section 564(b)(1) of the Act, 21 U.S.C. section 360bbb-3(b)(1), unless the authorization is terminated or revoked.  Performed at [facility], [address], [place], [place] [address]      Labs: BNP (last 3 results) Recent Labs    11/07/20 0620 11/13/20 2105 11/16/20 1415  BNP 374.5* >4,500.0* 1,903.2*   Basic Metabolic Panel: Recent Labs  Lab 12/03/20 1452 12/03/20 1457  NA 136 136  K 3.7 3.5  CL 97* 97*  CO2 30  --   GLUCOSE 106* 103*  BUN 18 19  CREATININE 0.92 0.80  CALCIUM 8.8*  --    Liver Function Tests: Recent Labs  Lab 12/03/20 1452  AST 30  ALT 27  ALKPHOS 55  BILITOT 0.7  PROT 6.2*  ALBUMIN 3.1*   No results for input(s): LIPASE, AMYLASE in the last 168 hours. No results for input(s): AMMONIA in the last 168 hours. CBC: Recent Labs  Lab 12/03/20 1452 12/03/20 1457  WBC 7.7  --   NEUTROABS 4.7  --   HGB 15.7* 16.3*  HCT 48.0* 48.0*  MCV 96.0  --   PLT 155  --    Cardiac Enzymes: No results for input(s): CKTOTAL, CKMB, CKMBINDEX, TROPONINI in the last 168 hours. BNP: Invalid input(s):  POCBNP CBG: Recent Labs  Lab 12/03/20 1450  GLUCAP 105*   D-Dimer No results for input(s): DDIMER in the last 72 hours. Hgb A1c No results for input(s): HGBA1C in the last 72 hours. Lipid Profile No results for input(s): CHOL, HDL, LDLCALC, TRIG, CHOLHDL, LDLDIRECT in the last 72 hours. Thyroid function studies No results for input(s): TSH, T4TOTAL, T3FREE, THYROIDAB in the last 72 hours.  Invalid input(s): FREET3 Anemia work up No results for input(s): VITAMINB12, FOLATE, FERRITIN, TIBC, IRON, RETICCTPCT in the last 72 hours. Urinalysis    Component Value Date/Time   COLORURINE YELLOW 07/05/2020 1232   APPEARANCEUR CLOUDY (A) 07/05/2020 1232   LABSPEC 1.030 07/05/2020 1232   PHURINE 5.0 07/05/2020 1232   GLUCOSEU NEGATIVE 07/05/2020 1232   HGBUR SMALL (A) 07/05/2020 1232   BILIRUBINUR NEGATIVE 07/05/2020 1232   KETONESUR NEGATIVE 07/05/2020 1232   PROTEINUR >300 (A) 07/05/2020 1232   NITRITE NEGATIVE 07/05/2020 1232   LEUKOCYTESUR SMALL (A) 07/05/2020 1232   Sepsis Labs Invalid input(s): PROCALCITONIN,  WBC,  LACTICIDVEN  Microbiology Recent Results (from the past 240 hour(s))  MRSA PCR Screening     Status: None   Collection Time: 11/26/20  5:09 PM  Result Value Ref Range Status   MRSA by PCR NEGATIVE NEGATIVE Final    Comment:        The GeneXpert MRSA Assay (FDA approved for NASAL specimens only), is one component of a comprehensive MRSA colonization surveillance program. It is not intended to diagnose MRSA infection nor to guide or monitor treatment for MRSA infections. Performed at [place] [facility], [address], [place], [place] [address]   Resp Panel by RT-PCR (Flu A&B, Covid) Nasopharyngeal Swab     Status: None   Collection Time: 12/03/20  7:16 PM   Specimen: Nasopharyngeal Swab; Nasopharyngeal(NP) swabs in vial transport medium  Result Value Ref Range Status   SARS Coronavirus 2 by RT PCR NEGATIVE NEGATIVE Final    Comment: (NOTE) SARS-CoV-2  target nucleic acids are NOT DETECTED.  The SARS-CoV-2 RNA is generally detectable in upper respiratory specimens during the acute phase of infection. The lowest concentration of SARS-CoV-2 viral copies this assay can detect is 138 copies/mL. A negative result does not preclude SARS-Cov-2 infection and should not be used as the sole basis for treatment or other patient management decisions. A negative result may occur with  improper specimen collection/handling, submission of specimen other than nasopharyngeal swab, presence of viral mutation(s) within the areas targeted by this assay, and inadequate number of viral copies(<138 copies/mL). A negative result must be combined with clinical observations, patient history, and epidemiological information. The expected result is Negative.  Fact Sheet for Patients:  BloggerCourse.com  Fact Sheet for Healthcare Providers:  SeriousBroker.it  This test is no t yet approved or cleared by the [place] FDA and  has been authorized for detection and/or diagnosis of SARS-CoV-2 by FDA under an Emergency Use Authorization (EUA). This EUA will remain  in effect (meaning this test can be used) for the duration of the COVID-19 declaration under Section 564(b)(1) of the Act, 21 U.S.C.section 360bbb-3(b)(1), unless the authorization is terminated  or revoked sooner.       Influenza A by PCR NEGATIVE NEGATIVE Final   Influenza B by PCR NEGATIVE NEGATIVE Final    Comment: (NOTE) The Xpert Xpress SARS-CoV-2/FLU/RSV plus assay is intended as an aid in the diagnosis of influenza from Nasopharyngeal swab specimens and should not be used as a sole basis for treatment. Nasal washings and aspirates are unacceptable for Xpert Xpress SARS-CoV-2/FLU/RSV testing.  Fact Sheet for Patients: BloggerCourse.com  Fact Sheet for Healthcare  Providers: SeriousBroker.it  This test is not yet approved or cleared by the [place] FDA and has been authorized for detection and/or diagnosis of SARS-CoV-2 by FDA under an Emergency Use Authorization (EUA). This EUA will remain in effect (meaning this test can be used) for the duration of the COVID-19 declaration under Section 564(b)(1) of the Act, 21 U.S.C. section 360bbb-3(b)(1), unless the authorization is terminated or revoked.  Performed at [facility], [address], [place], [place] [address]      Time coordinating discharge: 45 minutes  SIGNED:   Verdia Kuba, MD  [facility] 12/06/2020, 5:00 PM Pager   If 7PM-7AM, please contact night-coverage [email or web address] Password [email or web address]

## 2020-12-06 NOTE — Progress Notes (Signed)
During night shift, telemetry called to report pt HR dropping to 39's but not sustaining and HR remained in the 50's and 60's during the shift. Pt was asymptotic and sleeping comfortably in bed. Reported off to oncoming RN. Dionne Bucy RN

## 2020-12-06 NOTE — Evaluation (Signed)
Speech Language Pathology Evaluation Patient Details Name: Bridget [name] MRN: [id number] DOB: 07/10/1925 Today's Date: 12/06/2020 Time: 0932-6712 SLP Time Calculation (min) (ACUTE ONLY): 25 min  Problem List:  Patient Active Problem List   Diagnosis Date Noted   DNR (do not resuscitate) 12/04/2020   Acute CVA (cerebrovascular accident) (HCC) 12/03/2020   Dementia with behavioral disturbance (HCC) 12/03/2020   Goals of care, counseling/discussion 12/03/2020   Ischemic stroke (HCC) 11/26/2020   Stroke (cerebrum) (HCC) 11/26/2020   Acute on chronic diastolic (congestive) heart failure (HCC) 11/13/2020   CKD (chronic kidney disease), stage II    Chronic diastolic CHF (congestive heart failure) (HCC)    Acute respiratory failure (HCC) 11/07/2020   Elevated troponin 11/07/2020   COPD with acute exacerbation (HCC) 08/31/2020   Atrial fibrillation, chronic (HCC) 08/31/2020   Acute CHF (congestive heart failure) (HCC) 08/31/2020   CKD (chronic kidney disease), stage III (HCC) 08/31/2020   Essential hypertension 07/06/2020   Acute lower UTI 07/06/2020   Acute respiratory disease due to COVID-19 virus 07/05/2020   Past Medical History:  Past Medical History:  Diagnosis Date   (HFpEF) heart failure with preserved ejection fraction (HCC)    a. 08/2020 Echo: EF 60-65%, no rwma, Gr1 DD, nl RV fxn. Mildly dil RA. Mild MR/AI.   Asthma    CKD (chronic kidney disease), stage III (HCC)    COPD (chronic obstructive pulmonary disease) (HCC)    Falls    Hypertension    Mild Dementia (HCC)    Permanent atrial fibrillation (HCC)    a. CHA2DS2VASc = 5-->no OAC 2/2 unsteady gait/falls.   Unsteady gait    Past Surgical History:  Past Surgical History:  Procedure Laterality Date   APPENDECTOMY     BREAST SURGERY     HERNIA REPAIR     HPI:  87 y.o. female with medical history significant of chronic diastolic CHF; COPD; stage 3 CKD; HTN; mild dementia; afib started on Eliquis 2 days ago  presenting with new stroke symptoms.  She was hospitalized for CVA and discharged yesterday.  She had a great night.  This AM, she seemed to be ok, was waiting for her coffee.  They went out on the porch and she asked for something to eat.  Ate a little and then she appeared to fall asleep with mildly shallow breathing.  Her left hand started "vibrating so much, like a seizure or something."  She woke up and felt very hot and called for help.  The wheeled her back into the house and called 911.  It was just like last week, "had gone blind again."  She also had LUE weakness.  During the last stroke, she was found on her bed with complete vision loss.  She regained vision after receiving TPA; the next day her vision was mostly restored.  She had also progressed from mild to moderate dementia at the time of d/c.   Assessment / Plan / Recommendation Clinical Impression  Pt seen for speech/language cognitive evaluation with SLUMS ([employer] Mental Status Examination) with the results as follows: 14/30 with 27/30 being regarded as typical/normal results for this assessment.  Pt had difficulty with memory in the areas of storage/retention of new information with a time constraint; she was able to name objects without a delay with repetition only.  She did recall 2/5 objects with a categorization cue, but without cueing, she could not recall any objects. Pt frequently stated "my mind won't let me remember things" and  things of this nature during the assessment, so awareness of some deficits noted, but overall she had limited awareness.  She was oriented to self only, but her speech was intelligible throughout assessment.  She gained 25% accuracy with auditory retention of a paragraph, but was able to complete a simple calculation task with repetition given.  Overall cognitive functioning appears to have declined since this recent CVA as she also had difficulty formulating items within a category and repeating  >3 digit number backwards, but could repeat 4 digit numbers forward and phrases/simple sentences without a time delay.  ST will f/u while in acute setting for cognitive deficits/education re: compensatory strategies to assist functionally at home when d/c.  Thank you for this consult.    SLP Assessment  SLP Recommendation/Assessment: Patient needs continued Speech Lanaguage Pathology Services SLP Visit Diagnosis: Cognitive communication deficit (R41.841);Attention and concentration deficit Attention and concentration deficit following: Cerebral infarction    Follow Up Recommendations  Home health SLP;24 hour supervision/assistance    Frequency and Duration min 1 x/week  1 week      SLP Evaluation Cognition  Overall Cognitive Status: Impaired/Different from baseline Arousal/Alertness: Awake/alert Orientation Level: Oriented to person;Disoriented to place;Disoriented to time;Disoriented to situation Attention: Sustained Sustained Attention: Impaired Sustained Attention Impairment: Verbal basic;Functional basic Memory: Impaired Memory Impairment: Storage deficit;Retrieval deficit;Decreased recall of new information;Decreased short term memory Decreased Short Term Memory: Verbal basic;Functional basic Immediate Memory Recall: Sock;Blue;Bed Memory Recall Sock: Not able to recall Memory Recall Blue: Not able to recall Memory Recall Bed: Not able to recall Awareness: Impaired Awareness Impairment: Emergent impairment Problem Solving: Impaired Problem Solving Impairment: Verbal basic;Functional basic Behaviors: Perseveration Safety/Judgment: Impaired       Comprehension  Auditory Comprehension Overall Auditory Comprehension: Impaired Commands: Impaired Multistep Basic Commands: 25-49% accurate Conversation: Simple Interfering Components: Working [employer] fast streamer;Attention EffectiveTechniques: Extra processing time;Repetition;Visual/Gestural cues Visual  Recognition/Discrimination Discrimination: Not tested Reading Comprehension Reading Status: Not tested    Expression Expression Primary Mode of Expression: Verbal Verbal Expression Overall Verbal Expression: Impaired Level of Generative/Spontaneous Verbalization: Conversation Repetition: Impaired Level of Impairment: Phrase level;Sentence level Naming: Impairment Responsive: 26-50% accurate Confrontation: Impaired Convergent: 25-49% accurate Divergent: 0-24% accurate Verbal Errors: Semantic paraphasias;Perseveration Pragmatics: Unable to assess Interfering Components: Attention Non-Verbal Means of Communication: Not applicable Written Expression Dominant Hand: Right Written Expression: Not tested   Oral / Motor  Oral Motor/Sensory Function Overall Oral Motor/Sensory Function: Within functional limits Motor Speech Overall Motor Speech: Appears within functional limits for tasks assessed Respiration: Within functional limits Phonation: Normal Resonance: Within functional limits Articulation: Within functional limitis Intelligibility: Intelligible Motor Planning: Witnin functional limits Motor Speech Errors: Not applicable                       Tressie Stalker, M.S., CCC-SLP 12/06/2020, 2:01 PM

## 2020-12-19 NOTE — Progress Notes (Deleted)
Cardiology Office Note    Date:  12/19/2020   ID:  Bridget [name], DOB Feb 03, 1926, MRN [id number]  PCP:  Glori Bickers, MD  Cardiologist:  Julien Nordmann, MD  Electrophysiologist:  None   Chief Complaint: ***  History of Present Illness:   Bridget [name] is a 87 y.o. female with history of permanent A. 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  She does not have a cardiologist back home, though has reported her PCP has been managing her A. fib.  Prior notes indicate that she has not been on anticoagulation secondary to history of unsteady gait and falls.  However, home medications have previously included Eliquis.  She has been hospitalized several times at [facility] this year, initially in 06/2020 with acute respiratory failure secondary to COVID infection complicated by UTI.  In 08/2020 she was admitted with acute on chronic diastolic CHF requiring diuresis.  Echo at that time showed an EF of 60 to 65% with grade 1 diastolic dysfunction, and mild mitral regurgitation.  She was admitted in late 10/2020 with acute on chronic hypoxic respiratory failure requiring BiPAP secondary to COPD exacerbation.  High-sensitivity troponin was mildly elevated, peaking at 601 and felt to be supply demand ischemia in the setting of COPD exacerbation with hypoxia.  Limited echo during that admission showed an EF of 55%, no regional wall motion abnormalities, normal RV systolic function with a mildly enlarged RV cavity size, moderately elevated PASP estimated at 56 mmHg, mildly dilated left atrium, moderately dilated right atrium, mild to moderate mitral regurgitation, and an estimated right atrial pressure of 15  mmHg.  With regards to her anticoagulation and A. fib, cardiology recommended this medication be continued, though she was not receiving this medicine during the admission.  It was not started at time of discharge.  She was readmitted in early 11/2020 with acute on chronic diastolic CHF with symptomatic improvement with IV diuresis.  At that time, it was [facility] was deferred to "those that know her better than me."  She was admitted to [facility] on 11/26/2020 with AMS and acute onset of cortical blindness suspected to be secondary to CVA occurring overnight during sleep.  MRI of the brain showed acute posterior circulation infarcts involving bilateral occipital lobes, right posterior medial temporal lobe, and cerebellum along with punctate acute artifact of the left frontal white matter.  She was treated with tPA.  She was subsequently transferred to [facility] where she was followed by the neurology service.  Prior to discharge, she was initiated on apixaban 2.5 mg which was continued at time of discharge.  She was most recently admitted from 6/23 through 6/26 with transient left-sided weakness suspicious for new CVA.  However, MRI of the brain showed no significant changes when compared to prior study.  There was concern for possible unwitnessed seizure-like activity with a possible postictal state.  However, EEG showed mild diffuse encephalopathy with no definite seizures.  Neurology was consulted with recommendation to continue Eliquis for stroke prevention with no further repeat stroke up work-up indicated.  ***   Labs independently reviewed: 11/2020 - potassium 3.5, BUN 19, serum creatinine 0.8, Hgb 16.3, PLT 155, albumin 3.1, AST/ALT normal, TC 120, TG 64, HDL 39,  LDL 68, A1c 6.3  Past Medical History:  Diagnosis Date   (HFpEF) heart failure with preserved ejection fraction (HCC)    a. 08/2020 Echo: EF 60-65%, no rwma, Gr1 DD, nl RV fxn. Mildly dil RA. Mild MR/AI.   Asthma    CKD (chronic kidney disease),  stage III (HCC)    COPD (chronic obstructive pulmonary disease) (HCC)    Falls    Hypertension    Mild Dementia (HCC)    Permanent atrial fibrillation (HCC)    a. CHA2DS2VASc = 5-->no OAC 2/2 unsteady gait/falls.   Unsteady gait     Past Surgical History:  Procedure Laterality Date   APPENDECTOMY     BREAST SURGERY     HERNIA REPAIR      Current Medications: No outpatient medications have been marked as taking for the 12/24/20 encounter (Appointment) with Sondra Barges, PA-C.    Allergies:   Patient has no known allergies.   Social History   Socioeconomic History   Marital status: Widowed    Spouse name: Not on file   Number of children: Not on file   Years of education: Not on file   Highest education level: Not on file  Occupational History   Occupation: retired  Tobacco Use   Smoking status: Former    Pack years: 0.00   Smokeless tobacco: Never   Tobacco comments:    34 pack year history  Substance and Sexual Activity   Alcohol use: Never   Drug use: Never   Sexual activity: Not Currently    Birth control/protection: Post-menopausal  Other Topics Concern   Not on file  Social History Narrative   Not on file   Social Determinants of Health   Financial Resource Strain: Not on file  Food Insecurity: Not on file  Transportation Needs: Not on file  Physical Activity: Not on file  Stress: Not on file  Social Connections: Not on file     Family History:  The patient's family history is negative for Immunocompromised.  ROS:   ROS   EKGs/Labs/Other Studies Reviewed:    Studies reviewed were summarized above. The additional studies were reviewed today:  Echo with bubble study 11/29/2020: 1. Left ventricular ejection fraction, by estimation, is 65 to 70%. The  left ventricle has normal function. The left ventricle has no regional  wall motion abnormalities. There is mild left ventricular hypertrophy.  Left ventricular diastolic function  could not be  evaluated.   2. Right ventricular systolic function is normal. The right ventricular  size is normal. There is moderately elevated pulmonary artery systolic  pressure.   3. Left atrial size was moderately dilated.   4. Right atrial size was mildly dilated.   5. The mitral valve is abnormal. Moderate mitral valve regurgitation.  There is mild late systolic prolapse of both leaflets of the mitral valve.   6. The aortic valve is tricuspid. Aortic valve regurgitation is mild.  Mild aortic valve sclerosis is present, with no evidence of aortic valve  stenosis. Aortic regurgitation PHT measures 520 msec.   7. Rouleaux formation in the IVC suggestive of sluggish venous  flow/venous stasis. The inferior vena cava is dilated in size with <50%  respiratory variability, suggesting right atrial pressure of 15 mmHg.   Comparison(s): Changes from prior study are noted. 11/09/2020: LVEF 55%,  mild LAE, moderate RAE.  __________  Limited echo 11/09/2020: 1. Left ventricular ejection fraction, by estimation, is 55 %. The left  ventricle has normal function.  The left ventricle has no regional wall  motion abnormalities.   2. Right ventricular systolic function is normal. The right ventricular  size is mildly enlarged. There is moderately elevated pulmonary artery  systolic pressure. The estimated right ventricular systolic pressure is  56.0 mmHg.   3. Left atrial size was mildly dilated.   4. Right atrial size was moderately dilated.   5. The mitral valve is normal in structure. Mild to moderate mitral valve  regurgitation   6. The inferior vena cava is dilated in size with <50% respiratory  variability, suggesting right atrial pressure of 15 mmHg.  __________  2D echo 08/31/2020: 1. Left ventricular ejection fraction, by estimation, is 60 to 65%. The  left ventricle has normal function. The left ventricle has no regional  wall motion abnormalities. There is mild left ventricular hypertrophy.  Left  ventricular diastolic parameters  are consistent with Grade I diastolic dysfunction (impaired relaxation).   2. Right ventricular systolic function is normal. The right ventricular  size is normal.   3. Right atrial size was mildly dilated.   4. The mitral valve is grossly normal. Mild mitral valve regurgitation.   5. The aortic valve was not well visualized. Aortic valve regurgitation  is mild.    EKG:  EKG is ordered today.  The EKG ordered today demonstrates ***  Recent Labs: 11/16/2020: B Natriuretic Peptide 1,903.2 12/03/2020: ALT 27; BUN 19; Creatinine, Ser 0.80; Hemoglobin 16.3; Platelets 155; Potassium 3.5; Sodium 136  Recent Lipid Panel    Component Value Date/Time   CHOL 120 11/27/2020 0436   TRIG 64 11/27/2020 0436   HDL 39 (L) 11/27/2020 0436   CHOLHDL 3.1 11/27/2020 0436   VLDL 13 11/27/2020 0436   LDLCALC 68 11/27/2020 0436    PHYSICAL EXAM:    VS:  There were no vitals taken for this visit.  BMI: There is no height or weight on file to calculate BMI.  Physical Exam  Wt Readings from Last 3 Encounters:  11/30/20 140 lb 10.5 oz (63.8 kg)  11/26/20 144 lb 10 oz (65.6 kg)  11/17/20 144 lb 10 oz (65.6 kg)     ASSESSMENT & PLAN:   Permanent A. fib: ***.  CHA2DS2-VASc at least 8 (CHF, HTN, age x2, CVA x2, vascular disease, sex category)  Recent bilateral posterior cerebral subacute infarcts:  HFpEF/pulmonary hypertension:  Hypertension: Blood pressure ***  CKD stage III:  Disposition: F/u with Dr. Mariah Milling or an APP in ***.   Medication Adjustments/Labs and Tests Ordered: Current medicines are reviewed at length with the patient today.  Concerns regarding medicines are outlined above. Medication changes, Labs and Tests ordered today are summarized above and listed in the Patient Instructions accessible in Encounters.   Signed, Eula Listen, PA-C 12/19/2020 3:00 PM     [facility] [address] [place], [place] [address] [phone]

## 2020-12-24 ENCOUNTER — Ambulatory Visit: Payer: Medicare Other | Admitting: Physician Assistant

## 2021-01-03 NOTE — Progress Notes (Deleted)
   Patient ID: Bridget [name], female    DOB: Mar 30, 1926, 87 y.o.   MRN: [id number]  HPI  Bridget [name] is a 87 y/o female with a history of  Echo report from 11/10/31 reviewed and showed an EF of 55% along with mild LAE, mild/moderate MR and moderately elevated PA Pressure of 56 mmHg.   Admitted 12/03/20 due to sudden onset of blindness and weakness. MRI unchanged from previous one. Possible seizure like activity. Neurology and palliative care consults obtained. PT/OT evaluations done. Discharged after 3 days with home health.   She presents today for her initial visit with a chief complaint of   Review of Systems    Physical Exam  Assessment & Plan:  1: Chronic heart failure with preserved ejection fraction with structural changes (LAE)- - NYHA class - BNP 11/16/20 was 1903.2  2: HTN-- - BP - BMP 12/03/20 reviewed and showed sodium 136, potassium 3.5, creatinine 0.8 and GFR 58  3: Atrial fibrillation-  4: Dementia-

## 2021-01-04 ENCOUNTER — Ambulatory Visit: Payer: Medicare Other | Admitting: Family

## 2021-01-04 ENCOUNTER — Telehealth: Payer: Self-pay | Admitting: Family

## 2021-01-04 NOTE — Telephone Encounter (Signed)
Patient did not show for her Heart Failure [facility] appointment on 01/04/21. Will attempt to reschedule.

## 2021-02-26 ENCOUNTER — Other Ambulatory Visit: Payer: Self-pay

## 2021-02-26 NOTE — Patient Outreach (Signed)
[facility]) Care Management  02/26/2021  Bridget [name] [birth year] [id number]   First telephone outreach attempt to obtain mRS. No answer. Left message for returned call.  [name] [facility] Management Assistant [phone]

## 2021-03-11 ENCOUNTER — Other Ambulatory Visit: Payer: Self-pay

## 2021-03-11 NOTE — Patient Outreach (Signed)
[facility]) Care Management  03/11/2021  Bridget [name] November 26, 1925 [id number]   Second telephone outreach attempt to obtain mRS. No answer. Left message for returned call.  [name] [facility] Management Assistant [phone]

## 2021-03-17 ENCOUNTER — Other Ambulatory Visit: Payer: Self-pay

## 2021-03-17 NOTE — Patient Outreach (Signed)
[facility]) Care Management  03/17/2021  Bridget [name] January 12, 1926 [id number]   3 outreach attempts were completed to obtain mRs. mRs could not be obtained because patient never returned my calls. mRs=7    [name] Care Management Assistant [phone]

## 2021-10-15 IMAGING — MR MR HEAD W/O CM
6 of 11 series · 24 of 48 positions shown · non-contrast
Comparison: Prior CTs from earlier the same day as well as recent
MRI from 11/27/2020.

CLINICAL DATA: Initial evaluation for stroke suspected.

EXAM:
MRI HEAD WITHOUT CONTRAST
TECHNIQUE: Multiplanar, multiecho pulse sequences of the brain and surrounding
structures were obtained without intravenous contrast.

[Series 2: DWI · axial · 3.0mm · 0.94mm/px · z∈[-114,+46]mm · 7 of 109 slices shown (1 of 2)]
[im 1/109]
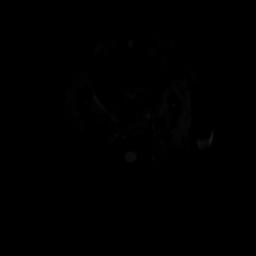
[im 19/109]
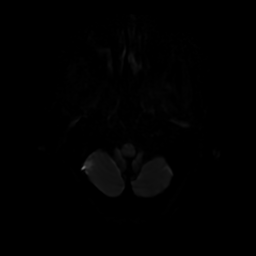
[im 37/109]
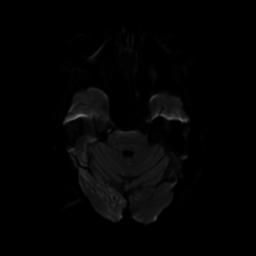
[im 55/109]
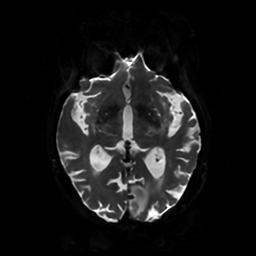
[im 73/109]
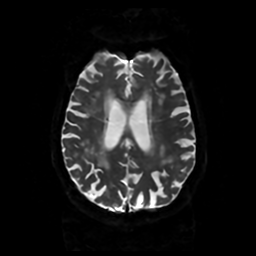
[im 91/109]
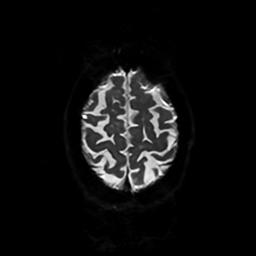
[im 109/109]
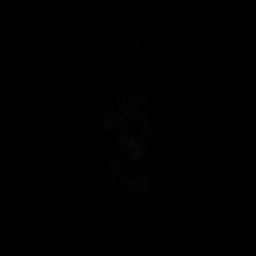

[Series 3: DWI · coronal · 4.0mm · 0.94mm/px · 5 of 80 slices shown (2 of 2)]
[im 1/80]
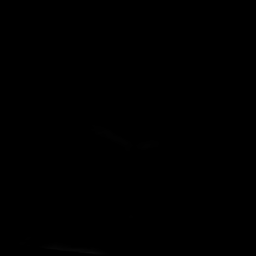
[im 20/80]
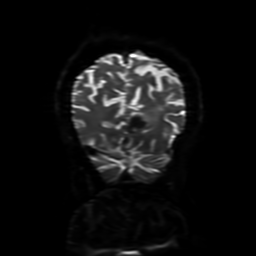
[im 40/80]
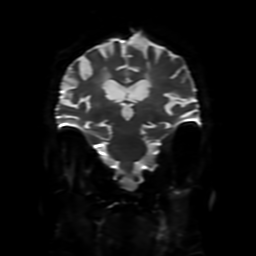
[im 60/80]
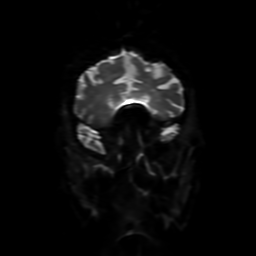
[im 80/80]
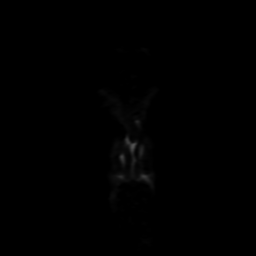

[Series 4: FLAIR · axial · 4.0mm · 0.45mm/px · z∈[-96,+48]mm · 3 of 35 slices shown (1 of 2)]
[im 1/35]
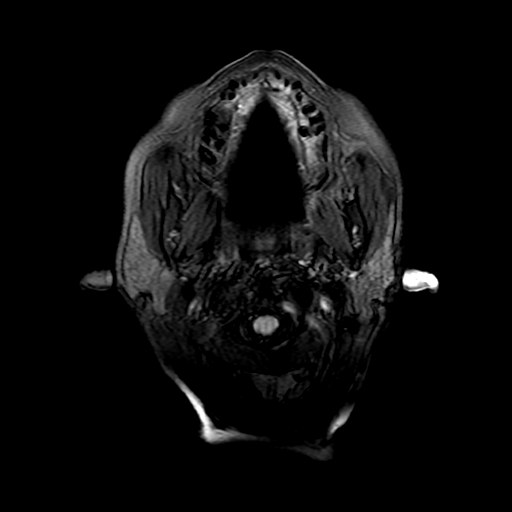
[im 18/35]
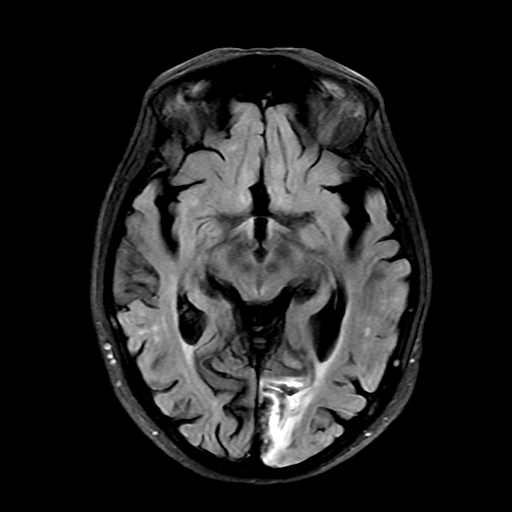
[im 35/35]
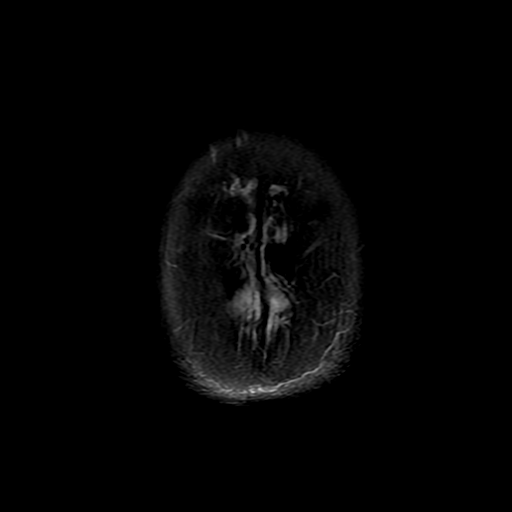

[Series 6: FLAIR · sagittal · 5.0mm · 0.23mm/px · 2 of 23 slices shown (2 of 2)]
[im 1/23]
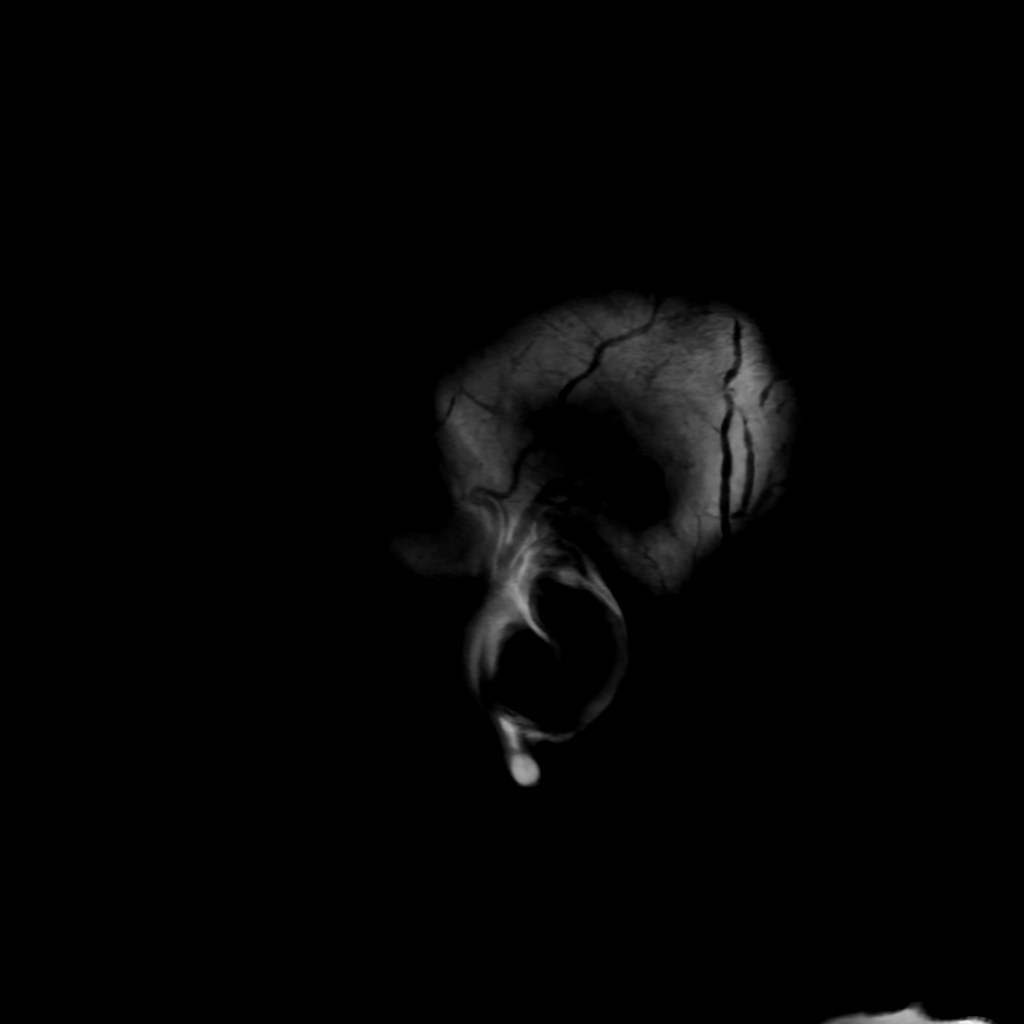
[im 23/23]
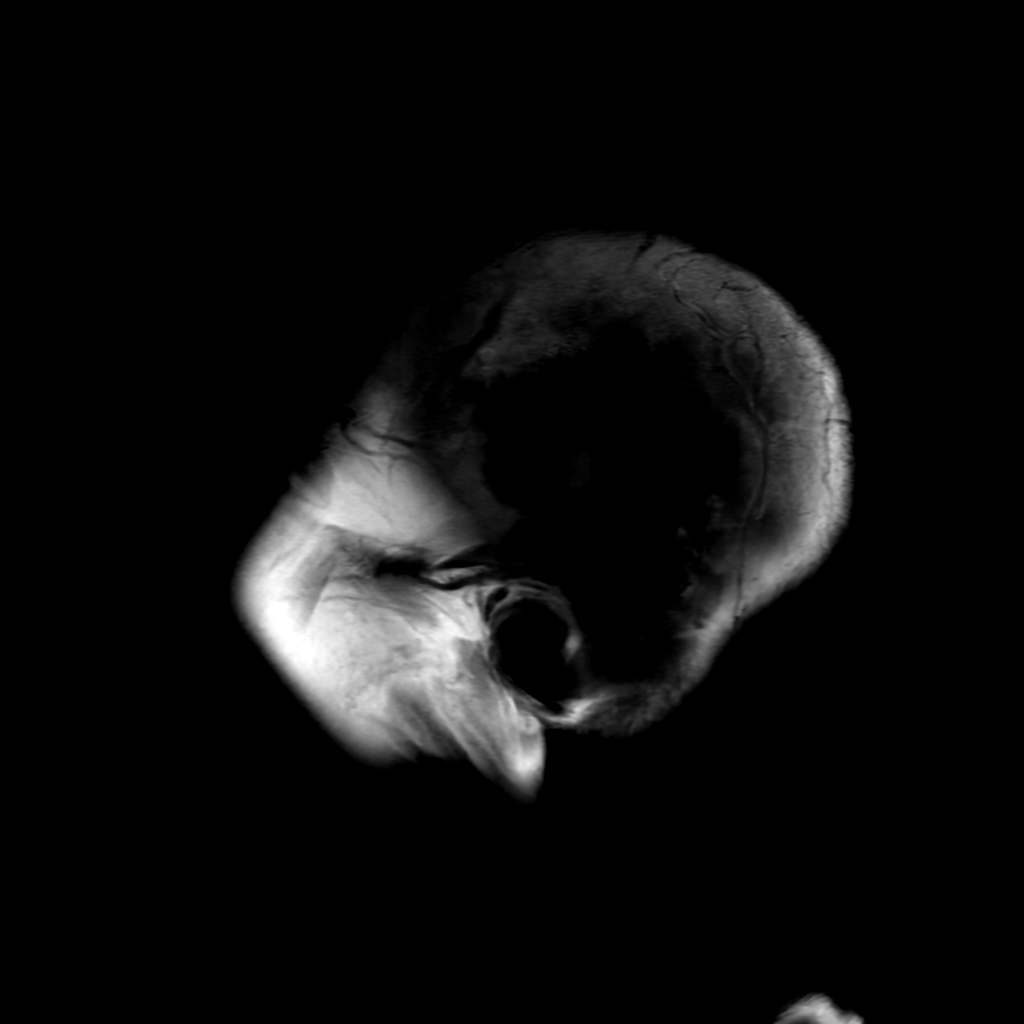

[Series 250: ADC · axial · 3.0mm · 0.94mm/px · z∈[-114,+46]mm · 4 of 55 slices shown (1 of 2)]
[im 1/55]
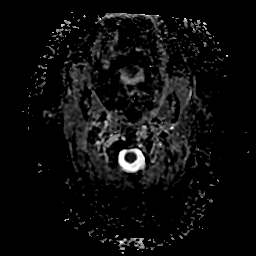
[im 19/55]
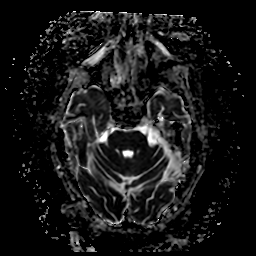
[im 37/55]
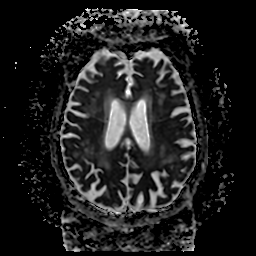
[im 55/55]
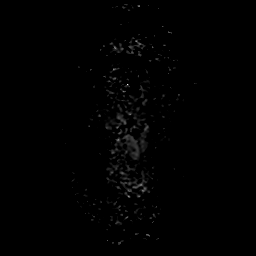

[Series 350: ADC · coronal · 4.0mm · 0.94mm/px · 3 of 40 slices shown (2 of 2)]
[im 1/40]
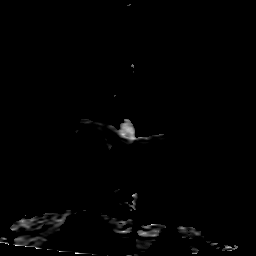
[im 20/40]
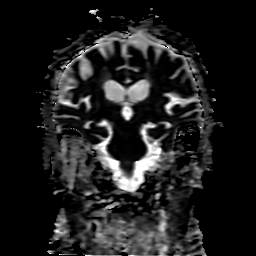
[im 40/40]
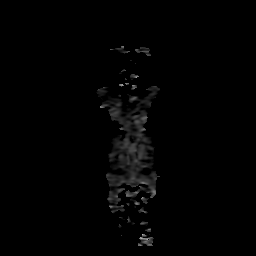

[24 of 48 positions shown; findings below may reference images not displayed]

FINDINGS: Brain: Examination mildly degraded by motion artifact.

Diffuse prominence of the CSF containing spaces compatible
generalized age-related cerebral atrophy. Patchy and confluent
T2/FLAIR hyperintensity within the periventricular deep white matter
both cerebral hemispheres as well as the pons, most consistent with
chronic small vessel ischemic disease, moderately advanced in
nature.

There has been continued interval evolution of the subacute left PCA
distribution infarct with associated hemorrhagic transformation.
Appearance is within normal limits for normal expected interval
evolution. Associated hemorrhage now demonstrates intrinsic T1
hyperintensity and is slightly smaller as compared to previous MRI,
now measuring approximately 3.5 x 2.0 cm (series 8, image 26). No
significant regional mass effect.

Continued interval evolution of subacute right PCA distribution
infarct. Area of involvement has slightly expanded as compared to
previous MRI, with further lateral extension into peripheral right
temporal occipital region as compared to previous (series 2, image
21). Overall appearance is subacute however, with no new
superimposed areas of acute infarction. Associated mild petechial
hemorrhage without frank hemorrhagic transformation or significant
mass effect.

Additional subacute cerebellar infarcts are overall similar in size
from previous MRI, with no new areas of infarction. No associated
hemorrhage or mass effect.

Additional punctate focus of subacute ischemia at the anterior left
frontal lobe noted as well, also stable from prior (series 2, image
37).

There [DATE] new punctate 4 mm focus of diffusion abnormality
involving the ventral medial left thalamus, not entirely certain as
this is only seen on axial DWI sequence (series 2, image 32), and
not seen on corresponding coronal sequence or ADC map.

No other new areas of acute ischemia. Gray-white matter
differentiation otherwise maintained. No other evidence for acute
intracranial hemorrhage. Additional subcentimeter chronic
microhemorrhage noted at the anterior left frontal corona radiata,
likely small vessel related.

No mass lesion, midline shift or mass effect. No hydrocephalus or
extra-axial fluid collection. Pituitary gland suprasellar region
within normal limits. Midline structures intact.

Vascular: Major intracranial vascular flow voids are maintained at
the skull base.

Skull and upper cervical spine: Craniocervical junction within
normal limits. Bone marrow signal intensity normal. No scalp soft
tissue abnormality.

Sinuses/Orbits: Patient status post ocular lens replacement on the
left. Globes and orbital soft tissues demonstrate no acute finding.
Scattered mucosal thickening noted throughout the sphenoid ethmoidal
and maxillary sinuses. Superimposed small air-fluid levels noted
within the maxillary sinuses. Right mastoid effusion noted.
Visualized nasopharynx unremarkable. Inner ear structures grossly
normal.

Other: None.
IMPRESSION: 1. Continued interval evolution of the subacute right PCA
distribution infarct, slightly expanded in territory as compared to
previous MRI, but overall subacute in appearance with no new areas
of superimposed infarction. Associated mild petechial hemorrhage
without hemorrhagic transformation.
2. Continued interval evolution of subacute left PCA distribution
infarct with associated hemorrhagic transformation, overall
decreased in size from previous.
3. Additional evolving subacute ischemic infarcts involving the left
frontal white matter and cerebellum, overall stable in size and
distribution.
4. Possible one new 4 mm acute ischemic infarct involving the
ventromedial left thalamus as above, not entirely certain and could
be artifactual in nature.
5. Underlying age-related cerebral atrophy with moderately advanced
chronic microvascular ischemic disease
6. Acute ethmoidal and maxillary sinusitis.
7. Right mastoid effusion, similar to previous.

## 2022-10-28 ENCOUNTER — Other Ambulatory Visit: Payer: Self-pay
